# Patient Record
Sex: Male | Born: 1945
Health system: Southern US, Community
[De-identification: ages and names within clinical notes are randomized; demographics above are authoritative.]

## PROBLEM LIST (undated history)

## (undated) DIAGNOSIS — I499 Cardiac arrhythmia, unspecified: Secondary | ICD-10-CM

## (undated) DIAGNOSIS — Z87442 Personal history of urinary calculi: Secondary | ICD-10-CM

## (undated) DIAGNOSIS — I739 Peripheral vascular disease, unspecified: Secondary | ICD-10-CM

## (undated) DIAGNOSIS — E785 Hyperlipidemia, unspecified: Secondary | ICD-10-CM

## (undated) DIAGNOSIS — N400 Enlarged prostate without lower urinary tract symptoms: Secondary | ICD-10-CM

## (undated) DIAGNOSIS — R55 Syncope and collapse: Secondary | ICD-10-CM

## (undated) DIAGNOSIS — I10 Essential (primary) hypertension: Secondary | ICD-10-CM

## (undated) DIAGNOSIS — M1990 Unspecified osteoarthritis, unspecified site: Secondary | ICD-10-CM

## (undated) DIAGNOSIS — I779 Disorder of arteries and arterioles, unspecified: Secondary | ICD-10-CM

## (undated) DIAGNOSIS — M545 Low back pain: Secondary | ICD-10-CM

## (undated) DIAGNOSIS — R319 Hematuria, unspecified: Secondary | ICD-10-CM

## (undated) DIAGNOSIS — I639 Cerebral infarction, unspecified: Secondary | ICD-10-CM

## (undated) DIAGNOSIS — I4819 Other persistent atrial fibrillation: Secondary | ICD-10-CM

## (undated) DIAGNOSIS — G8929 Other chronic pain: Secondary | ICD-10-CM

## (undated) DIAGNOSIS — I340 Nonrheumatic mitral (valve) insufficiency: Secondary | ICD-10-CM

## (undated) HISTORY — DX: Essential (primary) hypertension: I10

## (undated) HISTORY — DX: Other chronic pain: G89.29

## (undated) HISTORY — DX: Hematuria, unspecified: R31.9

## (undated) HISTORY — PX: INGUINAL HERNIA REPAIR: SHX194

## (undated) HISTORY — DX: Low back pain: M54.5

## (undated) HISTORY — PX: WRIST SURGERY: SHX841

## (undated) HISTORY — DX: Hyperlipidemia, unspecified: E78.5

## (undated) HISTORY — DX: Benign prostatic hyperplasia without lower urinary tract symptoms: N40.0

## (undated) HISTORY — DX: Peripheral vascular disease, unspecified: I73.9

## (undated) HISTORY — DX: Cerebral infarction, unspecified: I63.9

## (undated) HISTORY — PX: BLADDER STONE REMOVAL: SHX568

## (undated) HISTORY — DX: Disorder of arteries and arterioles, unspecified: I77.9

## (undated) HISTORY — DX: Nonrheumatic mitral (valve) insufficiency: I34.0

## (undated) HISTORY — DX: Syncope and collapse: R55

---

## 1999-12-16 ENCOUNTER — Inpatient Hospital Stay (HOSPITAL_COMMUNITY): Admission: EM | Admit: 1999-12-16 | Discharge: 1999-12-25 | Payer: Self-pay | Admitting: Emergency Medicine

## 1999-12-16 ENCOUNTER — Encounter: Payer: Self-pay | Admitting: Pediatrics

## 1999-12-16 ENCOUNTER — Encounter: Payer: Self-pay | Admitting: Emergency Medicine

## 1999-12-17 ENCOUNTER — Encounter: Payer: Self-pay | Admitting: Pediatrics

## 1999-12-19 ENCOUNTER — Encounter: Payer: Self-pay | Admitting: Pediatrics

## 2000-03-27 ENCOUNTER — Ambulatory Visit (HOSPITAL_COMMUNITY): Admission: RE | Admit: 2000-03-27 | Discharge: 2000-03-27 | Payer: Self-pay | Admitting: Gastroenterology

## 2000-03-27 ENCOUNTER — Encounter (INDEPENDENT_AMBULATORY_CARE_PROVIDER_SITE_OTHER): Payer: Self-pay

## 2000-10-18 ENCOUNTER — Encounter: Payer: Self-pay | Admitting: Neurology

## 2000-10-18 ENCOUNTER — Ambulatory Visit (HOSPITAL_COMMUNITY): Admission: AD | Admit: 2000-10-18 | Discharge: 2000-10-18 | Payer: Self-pay | Admitting: Neurology

## 2000-11-14 ENCOUNTER — Ambulatory Visit (HOSPITAL_COMMUNITY): Admission: RE | Admit: 2000-11-14 | Discharge: 2000-11-14 | Payer: Self-pay | Admitting: Neurology

## 2001-05-02 ENCOUNTER — Ambulatory Visit (HOSPITAL_COMMUNITY): Admission: RE | Admit: 2001-05-02 | Discharge: 2001-05-02 | Payer: Self-pay | Admitting: Gastroenterology

## 2003-06-05 ENCOUNTER — Ambulatory Visit (HOSPITAL_COMMUNITY): Admission: RE | Admit: 2003-06-05 | Discharge: 2003-06-05 | Payer: Self-pay | Admitting: Gastroenterology

## 2003-06-05 ENCOUNTER — Encounter (INDEPENDENT_AMBULATORY_CARE_PROVIDER_SITE_OTHER): Payer: Self-pay | Admitting: Specialist

## 2007-05-30 HISTORY — PX: PROSTATE SURGERY: SHX751

## 2008-01-21 ENCOUNTER — Encounter (INDEPENDENT_AMBULATORY_CARE_PROVIDER_SITE_OTHER): Payer: Self-pay | Admitting: Surgery

## 2008-01-21 ENCOUNTER — Ambulatory Visit (HOSPITAL_BASED_OUTPATIENT_CLINIC_OR_DEPARTMENT_OTHER): Admission: RE | Admit: 2008-01-21 | Discharge: 2008-01-21 | Payer: Self-pay | Admitting: Surgery

## 2008-05-29 HISTORY — PX: BLADDER STONE REMOVAL: SHX568

## 2008-05-29 HISTORY — PX: PROSTATE SURGERY: SHX751

## 2008-07-24 ENCOUNTER — Ambulatory Visit: Payer: Self-pay | Admitting: Cardiology

## 2008-07-30 ENCOUNTER — Ambulatory Visit: Payer: Self-pay | Admitting: Cardiology

## 2008-07-30 ENCOUNTER — Ambulatory Visit: Payer: Self-pay

## 2008-08-20 ENCOUNTER — Encounter: Payer: Self-pay | Admitting: Cardiology

## 2008-08-20 ENCOUNTER — Ambulatory Visit: Payer: Self-pay | Admitting: Cardiology

## 2008-08-25 ENCOUNTER — Encounter: Payer: Self-pay | Admitting: Cardiology

## 2008-08-25 ENCOUNTER — Ambulatory Visit: Payer: Self-pay

## 2008-09-02 ENCOUNTER — Encounter: Payer: Self-pay | Admitting: Cardiology

## 2008-09-02 ENCOUNTER — Ambulatory Visit: Payer: Self-pay | Admitting: Cardiology

## 2008-09-09 ENCOUNTER — Encounter (INDEPENDENT_AMBULATORY_CARE_PROVIDER_SITE_OTHER): Payer: Self-pay | Admitting: Urology

## 2008-09-09 ENCOUNTER — Inpatient Hospital Stay (HOSPITAL_COMMUNITY): Admission: RE | Admit: 2008-09-09 | Discharge: 2008-09-11 | Payer: Self-pay | Admitting: Urology

## 2008-09-17 ENCOUNTER — Telehealth: Payer: Self-pay | Admitting: Cardiology

## 2008-09-23 ENCOUNTER — Telehealth (INDEPENDENT_AMBULATORY_CARE_PROVIDER_SITE_OTHER): Payer: Self-pay | Admitting: *Deleted

## 2008-10-09 ENCOUNTER — Ambulatory Visit: Payer: Self-pay | Admitting: Cardiology

## 2008-12-04 ENCOUNTER — Encounter: Payer: Self-pay | Admitting: Cardiology

## 2008-12-07 ENCOUNTER — Ambulatory Visit: Payer: Self-pay | Admitting: Cardiology

## 2009-04-14 ENCOUNTER — Encounter (INDEPENDENT_AMBULATORY_CARE_PROVIDER_SITE_OTHER): Payer: Self-pay | Admitting: *Deleted

## 2009-06-04 ENCOUNTER — Ambulatory Visit: Payer: Self-pay | Admitting: Cardiology

## 2010-06-28 NOTE — Assessment & Plan Note (Signed)
Summary: f17m   Visit Type:  Follow-up Primary Provider:  Lupe Carney, MD  CC:  supraventricular tachycardia.  History of Present Illness: Patient is seen for followup of supraventricular tachycardia. He actually is doing well.  He ran a half marathon in the past few months.  He does have some dizziness.  I believe is a component of some vertigo and some mild orthostatic symptoms.  He's not had any syncope or presyncope.  There's been no chest pain.  There've been no significant palpitations.  Current Medications (verified): 1)  Simvastatin 20 Mg Tabs (Simvastatin) .Marland Kitchen.. 1 Once Daily 2)  Aspirin 325 Mg  Tabs (Aspirin) .Marland Kitchen.. 1 Once Daily 3)  Meclizine Hcl 25 Mg Tabs (Meclizine Hcl) .... Take 1 Tablet By Mouth As Directed 4)  Metoprolol Succinate 25 Mg Xr24h-Tab (Metoprolol Succinate) .... Take 1 Tablet By Mouth Every Morning  Allergies (verified): No Known Drug Allergies  Past History:  Past Medical History: supraventricular tachycardia hypercholesterolemia Hypertension Fracture of right hand in the past. CVA.Marland KitchenMarland KitchenLeft insular cortex stroke with left internal carotid artery dissection and    near occlusion. BPH Hematuria Neck discomfort Dizziness....some vertigo and some positional EF 55%  ... echo... August 25, 2008 Mitral regurgitation.... mild... echo... March, 2010 Nuclear stress... July 30, 2008.... no ischemia..... question of ejection fraction 41%, but followup 2-D echo reveals ejection fraction 55%  Review of Systems       Patient denies fever, chills, headache, rash, sweats, change in vision, change in hearing, chest pain, shortness of breath, cough, nausea vomiting, urinary symptoms, musculoskeletal problems.  All other systems are reviewed and are negative other than the history of present illness.  Vital Signs:  Patient profile:   65 year old male Height:      68 inches Weight:      163 pounds BMI:     24.87 Pulse rate:   70 / minute BP sitting:   122 / 86  (left  arm) Cuff size:   regular  Vitals Entered By: Hardin Negus, RMA (June 04, 2009 8:54 AM)  Physical Exam  General:  patient looks quite stable in general today. Head:  head is atraumatic. Eyes:  no xanthelasma. Neck:  no jugular venous distention. Chest Wall:  no chest wall tenderness. Lungs:  lungs are clear.  Respiratory effort is nonlabored. Heart:  cardiac exam is S1 and S2.  No clicks or significant murmurs. Abdomen:  abdomen soft. Msk:  no musculoskeletal deformities. Extremities:  no peripheral edema. Skin:  no skin rashes. Psych:  patient is oriented to person time and place.  Affect is normal.   Impression & Recommendations:  Problem # 1:  HYPERTENSION (ICD-401.9) Blood pressure is under good control.  No change in his meds today.  Problem # 2:  DIZZINESS (ICD-780.4) I carefully reviewed the history the patient's dizziness.  I believe there is a component of vertigo and possibly a mild orthostatic component at times.  He knows to stand up slowly in the morning.  I have encouraged him to use meclizine only if he has true vertigo with room spinning.  He understands this.  No further workup is needed.  Problem # 3:  PAROXYSMAL SUPRAVENTRICULAR TACHYCARDIA (ICD-427.0)  His updated medication list for this problem includes:    Aspirin 325 Mg Tabs (Aspirin) .Marland Kitchen... 1 once daily    Metoprolol Succinate 25 Mg Xr24h-tab (Metoprolol succinate) .Marland Kitchen... Take 1 tablet by mouth every morning  Orders: EKG w/ Interpretation (93000)  The patient has not had  any recurrent palpitations with supraventricular tachycardia.  No change in his meds.  EKG is done today and reviewed by me.  There is normal sinus rhythm.  Problem # 4:  * NECK DISCOMFORT He has not been having any neck discomfort.  No further workup.  Problem # 5:  HYPERLIPIDEMIA (ICD-272.4)  His updated medication list for this problem includes:    Simvastatin 20 Mg Tabs (Simvastatin) .Marland Kitchen... 1 once daily  His lipids are  being treated.  Also the patient back for cardiology followup in one year.  At some point we may consider followup 2-D echo to reassess his LV function.  Patient Instructions: 1)  Follow up in 1 year

## 2010-07-19 ENCOUNTER — Encounter: Payer: Self-pay | Admitting: Cardiology

## 2010-07-19 ENCOUNTER — Ambulatory Visit (INDEPENDENT_AMBULATORY_CARE_PROVIDER_SITE_OTHER): Payer: Medicare Other | Admitting: Cardiology

## 2010-07-19 DIAGNOSIS — I471 Supraventricular tachycardia: Secondary | ICD-10-CM

## 2010-07-26 NOTE — Assessment & Plan Note (Signed)
Summary: Z6X. per pt call.gd   Visit Type:  Follow-up Primary Provider:  Lupe Carney, MD  CC:  supraventricular tachycardia.  History of Present Illness: The patient is seen for followup of supraventricular tachycardia.  I saw him last January, 2011.  His SVT occurred in the past.  He's done well.  He continues to run.  He had an echo and nuclear study in 2010.  There was no ischemia and his ejection fraction was normal.  He mentions to me that he has returned to drinking coffee .  We discussed this at length.  I have encouraged him to cut his total caffeine down somewhat.  Current Medications (verified): 1)  Simvastatin 20 Mg Tabs (Simvastatin) .Marland Kitchen.. 1 Once Daily 2)  Aspirin 325 Mg  Tabs (Aspirin) .Marland Kitchen.. 1 Once Daily 3)  Meclizine Hcl 25 Mg Tabs (Meclizine Hcl) .... Take 1 Tablet By Mouth As Directed 4)  Metoprolol Succinate 25 Mg Xr24h-Tab (Metoprolol Succinate) .... Take 1 Tablet By Mouth Every Morning  Allergies (verified): No Known Drug Allergies  Past History:  Past Medical History: supraventricular tachycardia.. hypercholesterolemia Hypertension Fracture of right hand in the past. CVA.Marland KitchenMarland KitchenLeft insular cortex stroke with left internal carotid artery dissection and    near occlusion. BPH Hematuria Neck discomfort Dizziness....some vertigo and some positional EF 55%  ... echo... August 25, 2008 Mitral regurgitation.... mild... echo... March, 2010 Nuclear stress... July 30, 2008.... no ischemia..... question of ejection fraction 41%, but followup 2-D echo reveals ejection fraction 55%  Review of Systems       Patient denies fever, chills, headache, sweats, rash, change in vision, change in hearing, chest pain, cough, nausea vomiting, urinary symptoms.  All other systems are reviewed and are negative.  Vital Signs:  Patient profile:   65 year old male Height:      68 inches Weight:      162 pounds BMI:     24.72 Pulse rate:   58 / minute BP sitting:   118 / 70  (left  arm) Cuff size:   regular  Vitals Entered By: Hardin Negus, RMA (July 19, 2010 9:03 AM)  Physical Exam  General:  patient looked quite good. Head:  head is atraumatic. Eyes:  no xanthelasma. Neck:  no jugular venous distention.  No carotid bruits. Chest Wall:  no chest wall tenderness. Lungs:  lungs are clear.  Respiratory effort is not labored. Heart:  cardiac exam reveals S1-S2 present no clicks or significant murmurs. Abdomen:  abdomen is soft. Msk:  no musculoskeletal deformities. Extremities:  no peripheral edema. Skin:  no skin rashes. Psych:  patient is oriented to person time place affect is normal.   Impression & Recommendations:  Problem # 1:  HYPERTENSION (ICD-401.9) Blood pressure is under good control.  No change in therapy.  Problem # 2:  DIZZINESS (ICD-780.4) The patient has had mild dizziness in the past.  Is not having any significant problems with this now.  Problem # 3:  PAROXYSMAL SUPRAVENTRICULAR TACHYCARDIA (ICD-427.0) The patient had SVT in the past.  He is not having any recurrent symptoms.  His overall caffeine intake has increased.  We discussed this at length.  I've encouraged him to cut back the total caffeine.  I suggested he might want to try half and half coffee mixture.  EKG done today and reviewed by me.  He has normal sinus rhythm.  There is sinus bradycardia.  No significant abnormalities.  No further workup.  Problem # 4:  * HISTORY OF SPONTANEOUS  CAROTID DISSECTION The patient has history of a spontaneous carotid dissection in the remote past.  Fortunately he done extremely well and there is no further workup needed.  Problem # 5:  HYPERLIPIDEMIA (ICD-272.4) the patient's lipids are treated with simvastatin.  He is on 20 mg dose and there is no reason for any changes.  His labs are followed by his primary physician.  All planes seen for cardiology followup in one year.  Other Orders: EKG w/ Interpretation (93000)  Patient  Instructions: 1)  Your physician wants you to follow-up in:  1 year.  You will receive a reminder letter in the mail two months in advance. If you don't receive a letter, please call our office to schedule the follow-up appointment.

## 2010-08-26 ENCOUNTER — Other Ambulatory Visit: Payer: Self-pay | Admitting: Dermatology

## 2010-08-30 ENCOUNTER — Other Ambulatory Visit: Payer: Self-pay | Admitting: Cardiology

## 2010-09-07 LAB — CBC
HCT: 41.8 % (ref 39.0–52.0)
Hemoglobin: 14.4 g/dL (ref 13.0–17.0)
MCHC: 34.1 g/dL (ref 30.0–36.0)
MCV: 97.2 fL (ref 78.0–100.0)
Platelets: 140 10*3/uL — ABNORMAL LOW (ref 150–400)
Platelets: 180 10*3/uL (ref 150–400)
RBC: 4.72 MIL/uL (ref 4.22–5.81)
RDW: 13.5 % (ref 11.5–15.5)
WBC: 8.1 10*3/uL (ref 4.0–10.5)

## 2010-09-07 LAB — URINALYSIS, ROUTINE W REFLEX MICROSCOPIC
Ketones, ur: NEGATIVE mg/dL
Nitrite: POSITIVE — AB
Specific Gravity, Urine: 1.022 (ref 1.005–1.030)
Urobilinogen, UA: 0.2 mg/dL (ref 0.0–1.0)
pH: 6.5 (ref 5.0–8.0)

## 2010-09-07 LAB — BASIC METABOLIC PANEL
BUN: 14 mg/dL (ref 6–23)
CO2: 27 mEq/L (ref 19–32)
Calcium: 8.5 mg/dL (ref 8.4–10.5)
Calcium: 9.2 mg/dL (ref 8.4–10.5)
Creatinine, Ser: 1 mg/dL (ref 0.4–1.5)
Creatinine, Ser: 1.12 mg/dL (ref 0.4–1.5)
GFR calc Af Amer: >60 mL/min (ref >60)
GFR calc Af Amer: >60 mL/min (ref >60)
GFR calc non Af Amer: >60 mL/min (ref >60)
Sodium: 137 mEq/L (ref 135–145)

## 2010-10-11 NOTE — Op Note (Signed)
Billy Carpenter, Billy Carpenter                ACCOUNT NO.:  [id number]   MEDICAL RECORD NO.:  [id number]          PATIENT TYPE:  AMB   LOCATION:  [facility]                         FACILITY:  [facility]   PHYSICIAN:  Currie Paris, M.D.DATE OF BIRTH:  [birth year]   DATE OF PROCEDURE:  01/21/2008  DATE OF DISCHARGE:                               OPERATIVE REPORT   Office medical [facility] [phone].   PREOPERATIVE DIAGNOSIS:  Left inguinal hernia.   POSTOPERATIVE DIAGNOSIS:  Left inguinal hernia, indirect.   OPERATION:  Repair of left inguinal hernia with mesh.   SURGEON:  Currie Paris, M.D.   ANESTHESIA:  General (LMA).   CLINICAL HISTORY:  Mr. Homewood is a 65 year old gentleman who has a fairly  large left inguinal hernia that he desired to have repaired.   DESCRIPTION OF PROCEDURE:  The patient was seen in the holding area and  we went over the plans for the procedure and answered questions.  I  identified and marked the left inguinal area as the operative site.   The patient was taken to the operating room.  After satisfactory general  (LMA) anesthesia had been obtained, the left groin area was clipped,  prepped and draped.  The time-out was done.   To help with postop pain relief, I injected the area with 0.25% plain  Marcaine using incision line, right angles to the incision line, and  near the anterior superior iliac spine subfascially.   I made an inguinal incision and bleeders were either tied with 4-0  Vicryl or electrocoagulated.  The external oblique was identified and  cleaned off so that I could clearly identify that and the deep and the  superficial ring.  Self-retaining retractor was placed.   The external oblique was opened in the line of its fibers.  The cord  structures were densely adherent to the undersurface of the external  oblique near the superficial ring and had to be sharply and bluntly  dissected off.  This was clearly a chronic hernia with a fair amount of  chronic inflammatory changes.   Once I had that cleared off I was able to free up the cord structures  and surrounded them with a Penrose drain and cleared this up all the way  back to the deep ring.  The floor appeared intact although somewhat  attenuated.   The cord structures were dissected into and I identified a large thick  hernia sac consistent as an indirect hernia.  This was opened so I could  put my finger in this and strip off the surrounding densely adherent  cord structures.  There was also a fair amount of dense scar tissue  involving the cremaster muscle that I was able to divide with the  cautery to clear off the cord a little bit better.   When I was getting the indirect sac down towards the space, I looked in  it and I could see a piece of colon coming out into the sac.  I sharply  freed that up from some adhesions to the sac so that it reduced itself  nicely  back into the abdomen.  I do not believe this was a sliding  hernia but just a little piece of colon that was stuck into the sac.   With that done, I carefully placed a pursestring of silk at the base of  the sac, keeping visualization of the sac to make sure no further bowel  nestled up into the hernia sac and the sac was then amputated.   I cleaned off the floor to make sure everything looked good and then put  a piece of Bard mesh in using 2-0 Prolene.  It was cut to shape and  split to go around the cord and sutured with a running suture starting  at the pubic tubercle and running down along the shelving edge until I  got to the deep ring.  It then lay nicely medially over the internal  oblique and was tacked down.  The tails were crossed to go lateral to  the cord structures and tacked together.   I made sure everything was dry and put Marcaine in as I worked to be  sure we had as best we could good pain relief postoperatively.   The incision was then closed with 3-0 Vicryl to close the external   oblique over the repair, 3-0 Vicryl on Scarpa's and 4-0 Monocryl  subcuticular plus Dermabond on the skin.   The patient tolerated the procedure well and there were no  complications.  All counts were correct.      Currie Paris, M.D.  Electronically Signed     CJS/MEDQ  D:  01/21/2008  T:  01/21/2008  Job:  [id number]   cc:   L. Lupe Carney, M.D.  Fax: [phone]

## 2010-10-11 NOTE — Op Note (Signed)
NAMESHONN, FARRUGGIA                ACCOUNT NO.:  [id number]   MEDICAL RECORD NO.:  [id number]          PATIENT TYPE:  INP   LOCATION:  0005                         FACILITY:  [facility]   PHYSICIAN:  Courtney Paris, M.D.DATE OF BIRTH:  05-24-1946   DATE OF PROCEDURE:  09/09/2008  DATE OF DISCHARGE:                               OPERATIVE REPORT   PREOPERATIVE DIAGNOSIS:  Bladder stone 1.5 cm, obstructive uropathy.   POSTOPERATIVE DIAGNOSIS:  Bladder stone 1.5 cm, obstructive uropathy.   OPERATIONS:  Vesicolitholapaxy with holmium laser (1.5 cm),  transurethral resection of the prostate.   ANESTHESIA:  General.   SURGEON:  Courtney Paris, M.D.   BRIEF HISTORY:  This 66 year old patient has had obstructive uropathy  for quite some time.  He is on finasteride for at least 10 months and  his symptoms have not improved.  He is unable to take alpha blockers due  to drop in blood pressure.  He has a large bladder stone 1.5 cm and when  he runs he bleeds.  He enters to have this corrected surgically at this  time.   DESCRIPTION OF PROCEDURE:  The patient was placed on the table in dorsal  lithotomy position after satisfactory induction of general anesthesia.  He was prepped and draped with Betadine in the usual sterile fashion and  Cipro was started IV.  Time-out was then performed and the patient and  procedure then reconfirmed.  His urethra was dilated with Sissy Hoff  sounds to 28-French and the 28 Olympus continuous flow resectoscope was  inserted.  Under direct vision the stone could be seen, was photographed  and using the 1000 micron fiber with the holmium laser I was able to  under direct vision break the stone into tiny pieces.  The pieces were  then irrigated out through the sheath.  The resectoscope was then  replaced and under direct vision the bladder neck was first incised  between 5 and 7 o'clock position taking care not to injure the orifices.  This was taken  down to circular bladder neck fibers.  Then beginning at  the 12 o'clock position on the left side, I resected from 12 to 5  o'clock resecting the adenoma down to prostatic surgical capsule back to  the verumontanum.  Similarly on the right side I did the same thing,  resecting at the 12 o'clock position moving counterclockwise back to the  7 o'clock position in a similar fashion.  Hemostasis was achieved as I  proceeded.  The chips were then evacuated from bladder.  When viewing  from the veru, the bladder neck and prostatic urethra were wide open.  Pictures were made of this.  The chips were then evacuated from the  bladder.  The orifices looked intact and when viewing from the veru, the  bladder neck was wide open.  Scope was removed and the patient voided  with a good forceful stream.  A 22 three-way catheter was then inserted  without difficulty and the irrigant was then clear.  This was attached  to continuous bladder irrigation.  He was taken  recovery room in good  condition.  He will be admitted for postoperative care.     Courtney Paris, M.D.  Electronically Signed    HMK/MEDQ  D:  09/09/2008  T:  09/09/2008  Job:  [id number]

## 2010-10-11 NOTE — Assessment & Plan Note (Signed)
[facility]                            CARDIOLOGY OFFICE NOTE   Billy Carpenter, Billy Carpenter                       MRN:          [id number]  DATE:08/20/2008                            DOB:          1945-10-21    Billy Carpenter is seen today for cardiology followup.  I saw Billy Carpenter in  consultation on July 24, 2008.  At that time he complained of  palpitations.  He had also had some dizziness when getting up at night.  I recommended meclizine for the dizziness and this appears to help.  For  the palpitations, he has been wearing an event recorder.  We also did a  stress Myoview scan.  The patient runs regularly and I wanted to be sure  that there was no sign of significant ischemia.   On the treadmill he exercised for 10 minutes and 30 seconds.  This was  quite good for a 65 year old gentleman.  There were no diagnostic ST  changes.  There was slight decreased activity in the inferior wall.  This was probably due to inferior thinning.  There was no definite  ischemia.  The ejection fraction calculation by the nuclear scan was  decreased at 41%.   The patient has been wearing an event recorder.  While running on August 08, 2008, he noticed that as he started off he felt fairly short of  breath before picking up his work load.  This is probably when the  rhythm is documented showing supraventricular tachycardia at a rate of  approximately 170.  There was also some wide-complex tachycardia.  The  strips were reviewed by Dr. Graciela Husbands who felt that the wide-complex  tachycardia was heart attack.  He did feel that there was  supraventricular tachycardia with the possibility of atrial flutter or  the possibility of an atrial tachycardia.  The patient had symptoms  again on March 20 and there are more strips available.  Once again, he  had a narrow complex rhythm.  The rate again was in the range of 170.  On page 6 of the recording, the rhythm stops and he returns to  sinus  rhythm.  I do not see obvious flutter waves.   Today he returns and he is stable.  I have completely explained to him  the results of the nuclear scan.  I explained that we needed to assess  his LV function further with an echo to be sure about LV size and wall  motion.  Because of the supraventricular arrhythmia, I put him on a  small dose of beta blocker.   PAST MEDICAL HISTORY:   ALLERGIES:  No known drug allergies.   MEDICATIONS:  1. Aspirin.  2. Finasteride.  3. Simvastatin 20.  4. Metoprolol succinate 25 mg to be started today.   OTHER MEDICAL PROBLEMS:  See the list below.   REVIEW OF SYSTEMS:  He has no fevers or chills.  He is not having  headaches.  There are no eye problems.  His slight dizziness at night  time is improving.  He has no GI or  GU symptoms.  Otherwise his review  of systems is negative.   PHYSICAL EXAMINATION:  VITAL SIGNS:  Blood pressure is 140/90 with a  pulse of 62.  GENERAL:  The patient is oriented to person, time and place.  Affect is  normal.  HEENT:  Reveals no xanthelasma.  He has normal extraocular motion.  There are no carotid bruits.  There is no jugular venous distention.  LUNGS:  Clear.  Respiratory effort is not labored.  CARDIAC:  Exam reveals an S1 with an S2.  There are no clicks or  significant murmurs.  ABDOMEN:  The abdomen is soft.  There is no peripheral edema.   See the discussion above about the result of the nuclear scan and the  event recorder.   Problems include:  1. Hypercholesterolemia, treated.  2. Benign prostatic hypertrophy, treated.  3. History of a left carotid dissection in the past that was      spontaneous.  He was followed by neurology over time and he      stabilized.  4. History of tonsillectomy and left inguinal hernia.  5. Hematuria.  He has a bladder stone.  His cardiac status is stable      and he can undergo a procedure by Dr. Aldean Ast as indicated.  6. History of some neck discomfort.   This does not appear to be      ischemia by Myoview scan.  7. Palpitations with documented supraventricular tachycardia.  As      outlined, I am still not sure what this rhythm is.  Consideration      is given to atrial tachycardia or atrial flutter.  I have started      him on a low-dose beta blocker.  He will continue to wear the event      recorder for several more days.  I will then see him back for      followup.  8. Mild elevation of blood pressure.  This needs continued followup.  9. Sensation of some dizziness at night which probably is vertigo      responding to meclizine.   Low-dose beta blocker is added.  Two-dimensional echo is ordered.  I  will then see the patient for followup to see how to proceed based on  what his echo shows.  I spent an extended amount of time reviewing the  pages of the event recorder.  Also I reviewed all of the data and  discussed with him as outlined above.     Luis Abed, MD, [facility]  Electronically Signed    JDK/MedQ  DD: 08/20/2008  DT: 08/20/2008  Job #: [id number]   cc:   L. Lupe Carney, M.D.  Courtney Paris, M.D.

## 2010-10-11 NOTE — Assessment & Plan Note (Signed)
[facility]                                 ON-CALL NOTE   Billy Carpenter, Billy Carpenter                       MRN:          [id number]  DATE:08/09/2008                            DOB:          15-Nov-1945    CARDIOLOGIST:  Luis Abed, MD, [facility]   HISTORY:  I was contacted by [name] at [place] at [phone]  about Billy Carpenter.  He apparently had wide complex tachycardia today on  his event monitor.  He was recently evaluated by Dr. Myrtis Ser in late  February for dizziness.  Apparently the patient was set up for a stress  Myoview study.  The results of that test are unavailable to me at this  time.  The patient just completed the test last week.  I did speak with  the patient over the telephone today.  He tells me he went out for a run  at the time that the event was noted.  He felt somewhat sluggish at the  beginning of his run but completed of total of 45 minutes and felt well  during the run.  Afterwards he felt a little bit lightheaded but  otherwise denies any syncope, near syncope or chest pain.  I got a copy  of the strips from the [employer].  He has a great deal of  artifact noted at the time that he was out running.  There is one event  around 10:02 a.m. that appears to be a wide complex tachycardia with  rate of about 190.  It is hard to know if this is anything other than  sinus tachycardia with aberrancy or if he may have atrial flutter with  aberrancy or possibly nonsustained V tach.   PLAN:  I have asked the patient not to run any more until he is  contacted for further instructions.  I have left a message at the office  for someone to pull the strips and have them reviewed by Dr. Myrtis Ser.  I  have also asked that someone contact the patient for further  instructions if any.   DISPOSITION:  As outlined above.      Tereso Newcomer, PA-C  Electronically Signed      Luis Abed, MD, [facility]  Electronically Signed   SW/MedQ  DD:  08/09/2008  DT: 08/09/2008  Job #: [id number]

## 2010-10-11 NOTE — Discharge Summary (Signed)
Billy Carpenter, Billy Carpenter                ACCOUNT NO.:  [id number]   MEDICAL RECORD NO.:  [id number]          PATIENT TYPE:  INP   LOCATION:  1434                         FACILITY:  [facility]   PHYSICIAN:  Courtney Paris, M.D.DATE OF BIRTH:  [birth year]   DATE OF ADMISSION:  09/09/2008  DATE OF DISCHARGE:  09/11/2008                               DISCHARGE SUMMARY   DISCHARGE DIAGNOSES:  1. Bladder outlet obstruction.  2. Bladder calculus.  3. Obstructive uropathy.   OPERATIONS AND PROCEDURES:  TUR of the prostate and vesicolithotomy on  September 09, 2008.   BRIEF HISTORY:  This 65 year old patient had a 1 cm bladder stone and  obstructive uropathy.  He enters for TURP and removal of the bladder  stone.  He began to have bleeding with running.  He has had voiding  symptoms that had not improved with finasteride for 10 to 11 months.  He  is unable to take alpha blockers due to hypotension.  He had a left  carotid tear in July 2001 and is on Coumadin for 30 days.  He had a left  inguinal hernia repair in August 2009.  Cardiac monitor was negative.  He does have a family history of prostate cancer.  He enters now for  definitive therapy for his bladder stone and his obstructive uropathy.   The patient was admitted to the hospital.  His laboratory values were  normal.  He was taken to the operating room as an outpatient, underwent  a TUR of the prostate and a holmium laser lithotripsy of the bladder  stone.  Postoperatively, his urine was really clear with continuous  bladder irrigation so much so that I went ahead and removed the catheter  on the first postop day.  He voided for a couple of times but then end  up going into retention and I think this caused some bleeding, which  then had to be irrigated out with numerous hand irrigations.  Finally,  all the clots were gone and with a leg bag with ambulation, his urine  was just light pink and draining well.  He was discharged with the  Foley  catheter to have this removed in 3 days at home.  The path was benign.  He was sent home in improved ambulatory condition on a regular diet.  Continue his finasteride and antibiotics.      Courtney Paris, M.D.  Electronically Signed     HMK/MEDQ  D:  09/11/2008  T:  09/12/2008  Job:  [id number]

## 2010-10-11 NOTE — Assessment & Plan Note (Signed)
[facility]                            CARDIOLOGY OFFICE NOTE   Billy Carpenter, Billy Carpenter                       MRN:          [id number]  DATE:07/24/2008                            DOB:          [birth year]    [name] has been very active over the years.  He runs on a regular  basis.  Recently, he has had some dizziness and lightheadedness after  exertion.  He is here for further cardiac evaluation to see if there are  any significant cardiac abnormalities.  He has been quite healthy over  the years.  He does have BPH and he is on finasteride, and he is  followed by Dr. Aldean Carpenter.  He has also had a bladder stone that has  caused some blood in his urine primarily when he runs.  This may be  treated with laser in the near future.  In 2001, he had a spontaneous  left carotid dissection.  This presented with a headache.  He  stabilized.  He was followed by Dr. Anne Carpenter and Dr. Sharene Carpenter at Neurology  and by history from the patient, this improved spontaneously.  He has  been quite active.  He mentions that sometimes after running, he has a  sensation of lightheadedness and dizziness.  He also mentions that  sometimes in the evening when lying down or when getting up, he feels  the sensation of dizziness.  He has not had syncope or presyncope.   ALLERGIES:  No known drug allergies.   MEDICATIONS:  Aspirin, finasteride, sulfamethoxazole for his prostate,  and simvastatin.   OTHER MEDICAL PROBLEMS:  See the complete list below.   SOCIAL HISTORY:  The patient does not smoke.  He has mild alcohol  intake.  He exercises regularly.  He is married.   FAMILY HISTORY:  Noncontributory.   REVIEW OF SYSTEMS:  At the current time, he is not having any fevers or  chills.  He is not having any headaches.  There are no skin rashes.  He  has no GI or GU symptoms.  He has no major musculoskeletal complaints.  His review of systems is negative.   PHYSICAL EXAMINATION:   VITAL SIGNS:  Blood pressure 128/80 with a pulse  of 63.  Weight is 165 pounds.  The patient also had orthostatic blood  pressure check done today.  Lying 123/83 with a pulse of 62, sitting  131/90 with a pulse of 71 and some mild dizziness, standing 146/93 with  a rate of 69 and mild dizziness.  At 2 minutes standing 150/97 with a  rate of 72 and mild dizziness.  At 5 minutes standing 152/96 with a rate  of 82 and mild dizziness.  GENERAL:  The patient is oriented to person, time, and place.  Affect is  normal.  HEENT:  No xanthelasma.  He has normal extraocular motion.  NECK:  There are no carotid bruits.  There is no jugular venous  distention.  LUNGS:  Clear.  Respiratory effort is not labored.  CARDIAC:  S1 and S2.  There are no clicks or significant  murmurs.  ABDOMEN:  Soft.  EXTREMITIES:  He has no peripheral edema.   LABORATORY DATA:  EKG is done today.  It is normal.   PROBLEMS:  1. Hypercholesterolemia, treated with simvastatin.  2. Benign prostatic hyperplasia, treated with finasteride and      currently on sulfa.  3. History of a left carotid dissection in the past.  We need more      information from the Neurology office.  I will also go into E-chart      to see if any old data is present.  4. History of status post tonsillectomy and left inguinal hernia      repair.  5. Hematuria.  This is followed by Dr. Aldean Carpenter and it is related to      his bladder stone, which may be treated with laser in the future.  6. Some mild neck discomfort over time.  The patient also has had some      palpitations over time.  Historically, he had this in the past with      some increased caffeine.  He had stopped caffeine completely.      Recently, he had modest intake.  We will obtain 21-day monitor to      see if we can document the type of rhythm he is having at the time      of palpitations.  7. Question of mild increase in blood pressure recently.  8. Sensation of dizziness  sometimes when getting up at night,      sometimes after exercise.  At this time, I am not convinced that      this is related primarily to a cardiac abnormality.  Some of it      maybe vertigo.  He did have an episode of major vertigo in the      past.  I will give him a small trial of meclizine to try at      nighttime to see if this helps with some of the nighttime symptoms      including when he gets up at night to use the bathroom.  The      patient is active and does have risk factors for coronary artery      disease.  It is therefore, we will proceed with a stress Myoview      scan.  I will be able to see if he developed any stress-related      arrhythmias, whether he develop any stress-related hypotension and      to be sure that he has no marked ischemia.  These studies will be      arranged and I will see him back for followup.     Billy Abed, MD, [facility]  Electronically Signed    JDK/MedQ  DD: 07/24/2008  DT: 07/25/2008  Job #: [id number]   cc:   Billy Carpenter, M.D.  Billy Carpenter, M.D.

## 2010-10-14 NOTE — Discharge Summary (Signed)
[place]  Patient:    Billy Carpenter, Billy Carpenter                       MRN: [id number] Adm. Date:  [id number] Disc. Date: [id number] Attending:  Mick Sell CC:         [facility], [address]                           Discharge Summary  ADMISSION DIAGNOSIS:  Onset of left brain stroke, rule out carotid occlusion.  DISCHARGE DIAGNOSES: 1. 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 2. History of borderline increased cholesterol. 3. History of guaiac-positive stools.  PROCEDURES THIS ADMISSION: 1. CT of the brain. 2. MRI of the brain. 3. MRI angiogram. 4. Cerebral angiogram. 5. Carotid Doppler study.  COMPLICATIONS:  None.  HISTORY OF PRESENT ILLNESS:  Royer Carpenter is a 65 year old right-handed, white male born 1945-06-29 with a history of cardiac arrhythmias in the past, hypercholesterolemia.  The patient has been admitted to [facility] on the 16 December 1999, with onset of left eye pain, visual loss in the left eye with a superior ______ defect, pain in the neck, both sides of head.  This began to some degree off and on almost a week prior to admission but was more constant, severe on the day of admission.  The patient was brought to the hospital for an evaluation.  Code stroke was activated.  The patient was found to have findings consistent with a left insular cortex CVA and the patient was admitted for evaluation.  The patient was placed on heparin.  PAST MEDICAL HISTORY: 1. History of cardiac arrhythmia.  No MI in the past. 2. History of borderline hypercholesterolemia. 3. History of hypertension on the day of admission. 4. History of guaiac-positive stool without GI evaluation previously. 5. History of right hand fracture in the past.  MEDICATIONS PRIOR TO ADMISSION:  None.  SOCIAL HISTORY:  The patient does not smoke, uses alcohol on  occasion. Please refer to history and physical dictation summary for social history, family history, review of systems, and physical examination.  LABORATORY VALUES:  Notable for an INR of 2.0 at the time of discharge.  White count 6.0, hemoglobin 15.1, hematocrit 41.6, MCV of 90.5, platelets of 209. Sodium 141, potassium 3.8, chloride of 105, CO2 of 29.  Glucose of 113, BUN of 14, creatinine 1.0, calcium 8.8, total protein 6.7, albumin 3.9.  AST of 22, ALT of 27, ______ 73, total bilirubin of 0.9.  Cholesterol level 195 with HDL level of 40, LDL of 133.  Ris ratio of 4.9 which is average.  Chest x-ray shows no acute abnormalities.  CT scan of the brain shows questionable sulcal effacement, left parietal lobe area.  ECG reveals normal sinus rhythm, normal ECG, heart rate 66.  HOSPITAL COURSE:  The patient has done very well during the course of hospitalization.  The patient was admitted to [facility] and underwent a CT of the head that was reviewed previously on the day of admission.  The patient was setup for an MRI scan of the brain which showed acute infarct in the ______ of the left insular cortex and MRI angiogram shows probable occlusion of left internal carotid artery versus high-grade proximal stenosis.  Carotid Doppler study was also performed showing evidence of a  right internal carotid artery stenosis, evidence of left internal carotid artery occlusion, vertebral artery flow.  The patient was setup for a cerebral angiogram that was done showing evidence of 95-98% narrowing of the left internal carotid artery but the vessel was open.  CVTS consultation was obtained and it was felt that the patient had a carotid dissected that had proceeded distally to consider surgery.  This patient was converted to Coumadin which took several days and the patient is being discharged with an INR of 2.0 on the day of this dictation.  The patient has complained with some double vision  with a left abducens palsy but this is resolving and the patient has no neurologic deficits otherwise with motor or sensory complaints.  Headache has resolved.  The patient has normal speech pattern.  The patient is fully ambulatory.  At the time of discharge, the patient will be on Coumadin 5 mg one a day.  He is to have no strenuous activity for six weeks.  He is to refrain from traveling from with work for about three weeks and is to eat no leafy green vegetables.  The patient will have a pro time checked within two days following discharge.  This will be done at [facility].  The patient is to follow up with the [facility] within two to three weeks for an evaluation.  The patient is to contact our office immediately if any further problems arise.  With a history of heme-positive stools, the patient will need to be watched for signs of melena. DD:  12/25/99 TD:  12/27/99 Job: [id number]

## 2010-10-14 NOTE — Op Note (Signed)
NAME:  Billy Carpenter, Billy Carpenter                          ACCOUNT NO.:  [id number]   MEDICAL RECORD NO.:  [id number]                   PATIENT TYPE:  AMB   LOCATION:  ENDO                                 FACILITY:  [facility]   PHYSICIAN:  James L. Malon Kindle., M.D.          DATE OF BIRTH:  [birth year]   DATE OF PROCEDURE:  06/05/2003  DATE OF DISCHARGE:                                 OPERATIVE REPORT   PROCEDURE:  Colonoscopy and coagulation of polyp.   MEDICATIONS:  Fentanyl 70 mcg, Versed 7 mg IV.   ENDOSCOPE:  Adult Olympus colonoscope.   INDICATIONS:  Previous history of adenomatous colon polyps.  He had one  massive adenomatous polyp removed.   DESCRIPTION OF PROCEDURE:  The procedure had been explained to the patient  and consent obtained.  With the patient in the left lateral decubitus  position, the adult Olympus colonoscope was inserted and advanced.  The  patient had a long, tortuous colon.  Using abdominal pressure and position  changes, we were able to advance to the cecum.  The ileocecal valve and  appendiceal orifice were seen.  Across from the ileocecal valve was a 3-4 mm  sessile polyp, which was cauterized with hot biopsy forceps.  There was no  bleeding.  The scope was withdrawn and the cecum, ascending colon,  transverse, descending and sigmoid colon were seen well.  Upon removal, no  polyps seen otherwise.  No diverticular disease.  The rectum was free of  polyps.  The scope was withdrawn and the patient tolerated the procedure  well.   ASSESSMENT:  Cecal polyp, 221.3.   PLAN:  Routine postpolypectomy instructions.  Will recommend repeating  procedure in three years.                                               James L. Malon Kindle., M.D.    Waldron Session  D:  06/05/2003  T:  06/06/2003  Job:  [id number]   cc:   L. Lupe Carney, M.D.  [address] [place]  [place] [address]  Fax: [phone]

## 2010-10-14 NOTE — Consult Note (Signed)
[place]  Patient:    Billy Carpenter, Billy Carpenter                       MRN: [id number] Proc. Date: 12/20/99 Adm. Date:  [id number] Attending:  Mick Sell CC:         Deanna Artis. Sharene Skeans, M.D.                          Consultation Report  CHIEF COMPLAINT:  Recent left brain CVA secondary to severe left carotid occlusive disease.  HISTORY OF PRESENT ILLNESS:  I appreciate the opportunity of seeing this 65 year old gentleman in consultation regarding his recent left brain CVA. This healthy 65 year old gentleman had the sudden onset of left eye pain and a headache on December 16, 1999 and developed some loss of his visual field in the left eye and some speech disturbance.  He was seen in the [facility] Emergency Room and admitted for possible left brain CVA.  MRI scan subsequently revealed acute infarct in the left frontoparietal lobe and possible occlusion of left internal carotid artery.  The patient had no preexisting symptoms of cerebral ischemia, although he did notice that recently he has been having increasing headaches in the post occipital region and occasional "woozy" feeling while running which he has not experienced in the past.  He denies any previous history of amaurosis fugax, hemiparesis, aphagia, posterior circulation, or TIAs.  He was not taking any medications at the time of his admission to the hospital.  PAST MEDICAL HISTORY:  Positive for cardiac arrhythmias.  Coronary artery disease is negative. No history of myocardial infarction, angina pectoris, CVA, TIAs, diabetes mellitus, deep vein thrombosis, thrombophlebitis or hypertension.  SOCIAL HISTORY: Negative for tobacco.  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  PAST SURGICAL HISTORY:  None.  REVIEW OF SYSTEMS:  Negative with no previous vascular problems or GI problems.  PHYSICAL EXAMINATION:  The patient was alert and oriented, well-developed and well-nourished  white male in no apparent distress.  Neck is supple with 3+ carotid pulses.  No bruits were audible.  There was no palpable adenopathy in the neck.   Upper extremity pulses are intact with good strength. His chest is clear.  His abdomen is soft, nontender with no palpable masses.  His lower extremity pulses are symmetrical bilaterally with good strength, 3+ femoral, popliteal and posterior tibial pulses.  There is no distal edema. Neurologic exam today reveals fluent speech with no difficulty or slurred speech.  He has good motor and sensory function bilaterally.  He states that he still has a minor visual field loss in the left eye but grossly is intact.  HOSPITAL STUDIES:  A Duplex carotid scan done on December 19, 1999 which revealed possible occlusion of the left internal carotid artery with no significant right internal carotid stenosis.  Cerebral angiography was performed on December 19, 1999 and has not been reviewed by me yet but did reveal apparently severe focal stenosis in his left internal carotid artery with possible localized dissection but distal flow intact and no significant right internal carotid disease.  IMPRESSION:  Recent left brain cerebrovascular accident with severe left internal carotid occlusive disease.  RECOMMENDATION:  Would continue heparin therapy and I will review the cerebral angiograms.  Would recommend left carotid endarterectomy later this week after review and allowing a few more days for cerebral edema to resolve following acute  infarct.  Doubt that this is a spontaneous dissection of the carotid but more likely due to an atherosclerotic plaque.  DD:  12/20/99 TD:  12/21/99 Job: [id number]

## 2010-10-14 NOTE — H&P (Signed)
[place]  Patient:    Billy Carpenter, Billy Carpenter                       MRN: [id number] Adm. Date:  [id number] Attending:  Mick Sell                         History and Physical  DATE OF BIRTH: 1945/08/17  CHIEF COMPLAINT: [name] is a 65 year old right-handed married Caucasian gentleman who had sudden onset of left eye pain with visual loss in the left eye (superior altitudinal defect), pain in his neck involving both sides of the head and the base of the brain, around 12:30 p.m. this afternoon.  He was very bothered by the headache to the point where he needed to lie down in his office.  He noted onset of speech disturbance beginning somewhere in the afternoon, possibly around 3:30 p.m.  His daughter called around 3:45 p.m. from [place] and noted speech difficulty.  EMS was contacted at 16:15 and arrived at [facility] at 16:53.  ER evaluation at 18:30, call to me at 18:53.  Code stroke was activated at that time.  I was at the bedside at 19:45.  PAST MEDICAL HISTORY:  1. Cardiac arrhythmia (no MI).  2. Borderline hypercholesterolemia (220s).  3. Hypertension was noted only today (see below).  4. Positive for guaiac positive stool within the past year.  This was not     further evaluated.  5. Fracture of right hand in the past.  The patient has not had diabetes mellitus.  PAST SURGICAL HISTORY: None.  SOCIAL HISTORY: He does not use tobacco.  He only uses occasional alcohol.  He is a Technical brewer for [employer].  REVIEW OF SYSTEMS: The patient has not had fever or intercurrent infection of the head, neck, lungs, GI, GU, myalgias, epistaxis, hematemesis, hematochezia, or melena.  There has been a speech disorder as noted above.  The patient has not noted weakness, numbness, diplopia, dysarthria, dysphagia, tinnitus, syncope, vertigo, or loss of bowel or bladder control.  No seizures.  The patient has  not had any palpitations, chest pain, shortness of breath, edema, congestive heart failure.  No underlying renal dysfunction.  Review Of Systems except as noted is otherwise negative.  FAMILY HISTORY: Parents are still alive, father age 7 with prostate cancer and mother age 74 with hypertension and arthritis.  No family history of cerebrovascular accident.  PHYSICAL EXAMINATION:  VITAL SIGNS: Initial blood pressure 206/138 dropped to 157/108, then dropped to 137/85 without specific treatment.  Pulse 65, respirations 20, temperature 98.0 degrees.  Pulse oximetry 98%.  HEENT: No signs of infection.  NECK: Supple, full range of motion.  No cranial or cervical bruits.  No localized tenderness.  No meningismus.  LUNGS: Clear.  HEART: No murmurs.  Pulses normal.  ABDOMEN: Soft, bowel sounds normal.  No hepatosplenomegaly.  EXTREMITIES: Normal.  NEUROLOGIC: The patient has significant fluent dysphagia.  He could name some objects with some difficulty repeating.  Follows commands well.  Cranial nerves show pupils are round and reactive.  Visual fields full.  Fundi were normal.  OKN responses equal.  Symmetric facial strength.  Midline tongue and uvula.  Air conduction greater than bone conduction bilaterally.  Motor examination shows normal strength.  Good fine motor movements.  No pronator drift.  The patient has excellent strength in the arms and legs, both sides.  Sensory examination intact to primary and cortical modalities including stereognosis, cold, and vibration.  Cerebellar examination shows good finger-to-nose and rapid repetitive movements.  Gait was not tested. Deep tendon reflexes are normal.  The patient had bilateral flexor and plantar responses.  IMPRESSION: I suspect a left insular cortex cerebrovascular accident.  I suspect a left carotid occlusion as the etiology of this because of the eye pain and ipsilateral visual loss and slow onset of ipsilateral  cortical dysfunction.  With the history of guaiac positive stool I have given the family informed consent regarding the necessity of heparin.  I suspect that either the patient has bled into a plaque or perhaps had a dissection.  PLAN: Heparin tonight.  MRI with intracranial MRA and extracranial MRA tomorrow.  This will need to be done on an expedited basis and I will speak with the radiologist in the early morning.  The patient will also need to have a 2D echocardiogram and if the MRA is sufficiently of good quality (extracranial), we will likely discontinue the carotid Doppler.  We will watch the patients stools and guaiac them.  If there is significant rectal bleeding we will have to stop heparin.  It is certainly possible that arteriogram could be considered; however, if there is a dissection we will wait for recannulization.  If there is a hemorrhagic plaque, we will consult CV/TS that they may recommend a conservative approach as long as the patient is not deteriorating.  We will need to work on obtaining a primary care physician for secondary stroke prevention upon his discharge. DD:  12/16/99 TD:  12/17/99 Job: [id number]

## 2010-10-14 NOTE — Procedures (Signed)
[facility]  Patient:    Billy Carpenter, Billy Carpenter Visit Number: [id number] MRN: [id number]          Service Type: END Location: ENDO Attending Physician:  Orland Mustard Dictated by:   Llana Aliment. Randa Evens, M.D. Proc. Date: 05/02/01 Admit Date:  05/02/2001   CC:         Wayne C. Dorna Bloom, M.D.   Procedure Report  PROCEDURE PERFORMED:  Colonoscopy.  ENDOSCOPIST:  Llana Aliment. Randa Evens, M.D.  MEDICATIONS USED:  Fentanyl 50 mcg, Versed 5 mg IV.  INSTRUMENT:  INDICATIONS:  Gentleman had heme positive stool one year ago and had multiple polyps removed.  One was a massive 3 cm polyp.  This procedure was done to evaluate for additional polyps.  DESCRIPTION OF PROCEDURE:  The procedure had been explained to the patient and consent obtained.  With the patient in the left lateral decubitus position, the Olympus adult video colonoscope was inserted and advanced under direct visualization.  The prep was excellent and we were able to advance to the cecum.  The right lower quadrant transilluminated and the ileocecal valve seen.  The scope was withdrawn.  The cecum, ascending colon, hepatic flexure, transverse colon, splenic flexure, descending and sigmoid colon were seen well upon withdrawal.  No polyps or other lesions seen.  Scope withdrawn, patient tolerated the procedure well.  ASSESSMENT:  No evidence of further polyps.  PLAN:  Due to the massive polyps found before in large numbers will recommend doing this again in two years. Dictated by:   Llana Aliment. Randa Evens, M.D. Attending Physician:  Orland Mustard DD:  05/02/01 TD:  05/02/01 Job: [id number]

## 2010-10-14 NOTE — Procedures (Signed)
[facility]  Patient:    Billy Carpenter, Billy Carpenter                       MRN: [id number] Proc. Date: 03/27/00 Adm. Date:  [id number] Attending:  Orland Mustard                           Procedure Report  PROCEDURE:  Colonoscopy and polypectomy.  MEDICATIONS:  Fentanyl 62.5 mcg, Versed 7 mg IV.  INDICATION:  Heme-positive stools.  DESCRIPTION OF PROCEDURE:  The procedure had been explained to the patient and consent obtained.  The adult Olympus video colonoscope was inserted, advanced under direct visualization.  A massive polyp was seen in the descending colon upon entering.  We were able to advance to the cecum.  In the tip of the cecum across from the ileocecal valve were two sessile polyps, each approximately 3-5 mm in diameter.  They were removed with the snare and sucked through the scope.  There was no bleeding seen.  The scope was withdrawn and the colon carefully examine.  No polyps were seen in the ascending or transverse colon. In the mid-descending colon approximately 60 cm from the anal verge was a massive polyp estimated to be 2-3 cm in diameter.  It was on a small stalk, and we were actually able to get the giant snare around the polyp, tighten down the stalk, and remove this in one piece.  There was no bleeding at the polypectomy site.  The polyp was recovered in toto down to the rectum, but unfortunately, due to its massive size, we were unable to pull it out with the snare and it transected into several pieces.  Each of these pieces was removed individually, and the smaller pieces were sucked through the scope.  There was no bleeding and no immediate problems.  ASSESSMENT: 1. Massive descending colon polyp, removed. 2. Small cecal polyps, removed.  PLAN:  Will recommend repeating in one year.  Routine postpolypectomy instructions.  Will keep off of anticoagulation, etc. DD:  03/27/00 TD:  03/27/00 Job: [id number]

## 2010-11-30 ENCOUNTER — Other Ambulatory Visit: Payer: Self-pay | Admitting: Cardiology

## 2011-08-11 DIAGNOSIS — Z125 Encounter for screening for malignant neoplasm of prostate: Secondary | ICD-10-CM | POA: Diagnosis not present

## 2011-08-11 DIAGNOSIS — E7800 Pure hypercholesterolemia, unspecified: Secondary | ICD-10-CM | POA: Diagnosis not present

## 2011-08-11 DIAGNOSIS — I10 Essential (primary) hypertension: Secondary | ICD-10-CM | POA: Diagnosis not present

## 2011-08-11 DIAGNOSIS — Z0000 Encounter for general adult medical examination without abnormal findings: Secondary | ICD-10-CM | POA: Diagnosis not present

## 2011-08-11 DIAGNOSIS — J309 Allergic rhinitis, unspecified: Secondary | ICD-10-CM | POA: Diagnosis not present

## 2011-09-04 ENCOUNTER — Other Ambulatory Visit: Payer: Self-pay | Admitting: Gastroenterology

## 2011-09-04 DIAGNOSIS — Z09 Encounter for follow-up examination after completed treatment for conditions other than malignant neoplasm: Secondary | ICD-10-CM | POA: Diagnosis not present

## 2011-09-04 DIAGNOSIS — D126 Benign neoplasm of colon, unspecified: Secondary | ICD-10-CM | POA: Diagnosis not present

## 2011-09-04 DIAGNOSIS — K648 Other hemorrhoids: Secondary | ICD-10-CM | POA: Diagnosis not present

## 2011-09-04 DIAGNOSIS — Z86010 Personal history of colonic polyps: Secondary | ICD-10-CM | POA: Diagnosis not present

## 2011-09-04 DIAGNOSIS — K5730 Diverticulosis of large intestine without perforation or abscess without bleeding: Secondary | ICD-10-CM | POA: Diagnosis not present

## 2011-10-31 ENCOUNTER — Other Ambulatory Visit: Payer: Self-pay | Admitting: Cardiology

## 2011-11-01 DIAGNOSIS — Z85828 Personal history of other malignant neoplasm of skin: Secondary | ICD-10-CM | POA: Diagnosis not present

## 2011-11-01 DIAGNOSIS — L570 Actinic keratosis: Secondary | ICD-10-CM | POA: Diagnosis not present

## 2011-12-03 ENCOUNTER — Other Ambulatory Visit: Payer: Self-pay | Admitting: Cardiology

## 2012-01-29 ENCOUNTER — Other Ambulatory Visit: Payer: Self-pay | Admitting: Cardiology

## 2012-02-02 DIAGNOSIS — L909 Atrophic disorder of skin, unspecified: Secondary | ICD-10-CM | POA: Diagnosis not present

## 2012-02-02 DIAGNOSIS — L919 Hypertrophic disorder of the skin, unspecified: Secondary | ICD-10-CM | POA: Diagnosis not present

## 2012-02-02 DIAGNOSIS — L821 Other seborrheic keratosis: Secondary | ICD-10-CM | POA: Diagnosis not present

## 2012-02-02 DIAGNOSIS — L570 Actinic keratosis: Secondary | ICD-10-CM | POA: Diagnosis not present

## 2012-02-19 DIAGNOSIS — I10 Essential (primary) hypertension: Secondary | ICD-10-CM | POA: Diagnosis not present

## 2012-02-19 DIAGNOSIS — Z79899 Other long term (current) drug therapy: Secondary | ICD-10-CM | POA: Diagnosis not present

## 2012-02-19 DIAGNOSIS — I471 Supraventricular tachycardia: Secondary | ICD-10-CM | POA: Diagnosis not present

## 2012-02-19 DIAGNOSIS — E7800 Pure hypercholesterolemia, unspecified: Secondary | ICD-10-CM | POA: Diagnosis not present

## 2012-02-23 ENCOUNTER — Encounter: Payer: Self-pay | Admitting: Cardiology

## 2012-02-23 DIAGNOSIS — R0789 Other chest pain: Secondary | ICD-10-CM | POA: Insufficient documentation

## 2012-02-23 DIAGNOSIS — I10 Essential (primary) hypertension: Secondary | ICD-10-CM | POA: Insufficient documentation

## 2012-02-23 DIAGNOSIS — IMO0002 Reserved for concepts with insufficient information to code with codable children: Secondary | ICD-10-CM | POA: Insufficient documentation

## 2012-02-23 DIAGNOSIS — I639 Cerebral infarction, unspecified: Secondary | ICD-10-CM | POA: Insufficient documentation

## 2012-02-23 DIAGNOSIS — I340 Nonrheumatic mitral (valve) insufficiency: Secondary | ICD-10-CM | POA: Insufficient documentation

## 2012-02-23 DIAGNOSIS — N400 Enlarged prostate without lower urinary tract symptoms: Secondary | ICD-10-CM | POA: Insufficient documentation

## 2012-02-23 DIAGNOSIS — I471 Supraventricular tachycardia: Secondary | ICD-10-CM | POA: Insufficient documentation

## 2012-02-23 DIAGNOSIS — R319 Hematuria, unspecified: Secondary | ICD-10-CM | POA: Insufficient documentation

## 2012-02-23 DIAGNOSIS — E785 Hyperlipidemia, unspecified: Secondary | ICD-10-CM | POA: Insufficient documentation

## 2012-02-23 DIAGNOSIS — R55 Syncope and collapse: Secondary | ICD-10-CM

## 2012-02-23 DIAGNOSIS — R42 Dizziness and giddiness: Secondary | ICD-10-CM | POA: Insufficient documentation

## 2012-02-23 DIAGNOSIS — R9430 Abnormal result of cardiovascular function study, unspecified: Secondary | ICD-10-CM | POA: Insufficient documentation

## 2012-02-26 ENCOUNTER — Ambulatory Visit (INDEPENDENT_AMBULATORY_CARE_PROVIDER_SITE_OTHER): Payer: Medicare Other | Admitting: Cardiology

## 2012-02-26 ENCOUNTER — Encounter: Payer: Self-pay | Admitting: Cardiology

## 2012-02-26 VITALS — BP 140/90 | HR 67 | Ht 68.0 in | Wt 160.8 lb

## 2012-02-26 DIAGNOSIS — I6350 Cerebral infarction due to unspecified occlusion or stenosis of unspecified cerebral artery: Secondary | ICD-10-CM | POA: Diagnosis not present

## 2012-02-26 DIAGNOSIS — I471 Supraventricular tachycardia: Secondary | ICD-10-CM

## 2012-02-26 DIAGNOSIS — I10 Essential (primary) hypertension: Secondary | ICD-10-CM

## 2012-02-26 DIAGNOSIS — E785 Hyperlipidemia, unspecified: Secondary | ICD-10-CM | POA: Diagnosis not present

## 2012-02-26 DIAGNOSIS — I498 Other specified cardiac arrhythmias: Secondary | ICD-10-CM

## 2012-02-26 DIAGNOSIS — I340 Nonrheumatic mitral (valve) insufficiency: Secondary | ICD-10-CM

## 2012-02-26 DIAGNOSIS — R0789 Other chest pain: Secondary | ICD-10-CM

## 2012-02-26 DIAGNOSIS — I639 Cerebral infarction, unspecified: Secondary | ICD-10-CM

## 2012-02-26 DIAGNOSIS — I059 Rheumatic mitral valve disease, unspecified: Secondary | ICD-10-CM

## 2012-02-26 NOTE — Patient Instructions (Addendum)
Your physician has requested that you have a carotid duplex. This test is an ultrasound of the carotid arteries in your neck. It looks at blood flow through these arteries that supply the brain with blood. Allow one hour for this exam. There are no restrictions or special instructions.  Your physician recommends that you continue on your current medications as directed. Please refer to the Current Medication list given to you today.  Your physician wants you to follow-up in: 1 year. You will receive a reminder letter in the mail two months in advance. If you don't receive a letter, please call our office to schedule the follow-up appointment.  

## 2012-02-26 NOTE — Progress Notes (Signed)
HPI  Should the patient is seen to followup history of supraventricular tachycardia. He had some SVT in the past but we have never actually seen his rhythm. He is very active. He will be running and a half marathon over the next several weeks. In 2010 he had an echo and nuclear study that were normal. In the past 5 or encouraged him to keep his caffeine load down.  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 He was treated with Coumadin at stabilize. He was told that he needed no further workup.  Most recently he's done very well. He does mention one episode of palpitations that last for a few hours several weeks ago. He had no chest pain or shortness of breath with it.  I saw him last in the office in February, 2012. Today I have reviewed all of his old records. I have updated the old data to the new electronic medical record.  No Known Allergies  Current Outpatient Prescriptions  Medication Sig Dispense Refill  . aspirin 325 MG tablet Take 325 mg by mouth daily.      . metoprolol succinate (TOPROL-XL) 25 MG 24 hr tablet TAKE 1 TABLET EVERY MORNING  30 tablet  1  . simvastatin (ZOCOR) 20 MG tablet Take 20 mg by mouth daily.         History   Social History  . Marital Status: Married    Spouse Name: N/A    Number of Children: N/A  . Years of Education: N/A   Occupational History  . Not on file.   Social History Main Topics  . Smoking status: Never Smoker   . Smokeless tobacco: Not on file  . Alcohol Use: Yes  . Drug Use: No  . Sexually Active:    Other Topics Concern  . Not on file   Social History Narrative  . No narrative on file    Family History  Problem Relation Age of Onset  . Arrhythmia Mother     Past Medical History  Diagnosis Date  . Supraventricular tachycardia   . Dyslipidemia   . Hypertension   . CVA (cerebral infarction)     left insular cortex stroke with left  internal carotid artery dissection and near occlusion  . BPH (benign prostatic hyperplasia)   . Hematuria   . Dizziness     some vertigo and some positional  . Ejection fraction     . EF 55%, echo, March, 2010  . Mitral regurgitation     mild, echo, March, 2010  . Chest discomfort     nuclear, March, 2010, no ischemia ( question of decreased ejection fraction but followup echo showed ejection fraction to be 55%)    Past Surgical History  Procedure Date  . Bladder stone removal   . Prostate surgery 2009  . Wrist surgery     Right    ROS   Patient denies fever, chills, headache, sweats, rash, change in vision, change in hearing, chest pain, cough, nausea vomiting, urinary symptoms. All other systems are reviewed and are negative.  PHYSICAL EXAM  Patient is oriented to person time and place. Affect is normal. There is no jugulovenous distention. There no carotid bruits. Lungs are clear. Respiratory effort is nonlabored. Cardiac exam reveals S1 and S2. There are no clicks or significant murmurs. The abdomen is soft. There is no peripheral edema. There are no musculoskeletal deformities. There are no  skin rashes.  Filed Vitals:   02/26/12 1440  BP: 140/90  Pulse: 67  Height: 5\' 8"  (1.727 m)  Weight: 160 lb 12.8 oz (72.938 kg)  SpO2: 98%   EKG is done today and reviewed by me. It is normal.  ASSESSMENT & PLAN

## 2012-02-26 NOTE — Assessment & Plan Note (Signed)
Lipids are being treated. 

## 2012-02-26 NOTE — Assessment & Plan Note (Signed)
Blood pressure is controlled. No change in therapy. 

## 2012-02-26 NOTE — Assessment & Plan Note (Signed)
In the future oh consider followup echo.

## 2012-02-26 NOTE — Assessment & Plan Note (Signed)
The patient has had supraventricular tachycardia in the past but we've never actually seen the rhythm. Several weeks ago he had one episode that lasted a few hours. He has had no recurrence. I've encouraged him to watch his caffeine intake. He will let me know if he begins having more frequent episodes. If so we'll have him wear a 21 day monitor. Otherwise I will not change his medicines. If he has a prolonged episode he'll take a second dose of his metoprolol.

## 2012-02-26 NOTE — Assessment & Plan Note (Signed)
He's had no recurrent chest discomfort.

## 2012-02-26 NOTE — Assessment & Plan Note (Signed)
The patient had spontaneous dissection of his left internal carotid artery in the past. There was near occlusion but it spontaneously improved with Coumadin. I've decided to do a carotid Doppler to be sure that we know the status of his carotids at this time.

## 2012-03-04 ENCOUNTER — Encounter (INDEPENDENT_AMBULATORY_CARE_PROVIDER_SITE_OTHER): Payer: Medicare Other

## 2012-03-04 DIAGNOSIS — R42 Dizziness and giddiness: Secondary | ICD-10-CM

## 2012-03-04 DIAGNOSIS — I6529 Occlusion and stenosis of unspecified carotid artery: Secondary | ICD-10-CM | POA: Diagnosis not present

## 2012-03-04 DIAGNOSIS — I639 Cerebral infarction, unspecified: Secondary | ICD-10-CM

## 2012-03-04 DIAGNOSIS — I10 Essential (primary) hypertension: Secondary | ICD-10-CM

## 2012-03-04 DIAGNOSIS — R0789 Other chest pain: Secondary | ICD-10-CM

## 2012-03-06 ENCOUNTER — Encounter: Payer: Self-pay | Admitting: Cardiology

## 2012-03-06 DIAGNOSIS — I739 Peripheral vascular disease, unspecified: Secondary | ICD-10-CM

## 2012-03-06 DIAGNOSIS — I779 Disorder of arteries and arterioles, unspecified: Secondary | ICD-10-CM | POA: Insufficient documentation

## 2012-03-30 ENCOUNTER — Other Ambulatory Visit: Payer: Self-pay | Admitting: Cardiology

## 2012-05-03 DIAGNOSIS — Z23 Encounter for immunization: Secondary | ICD-10-CM | POA: Diagnosis not present

## 2012-05-27 DIAGNOSIS — M5430 Sciatica, unspecified side: Secondary | ICD-10-CM | POA: Diagnosis not present

## 2012-05-27 DIAGNOSIS — M545 Low back pain, unspecified: Secondary | ICD-10-CM | POA: Diagnosis not present

## 2012-05-27 DIAGNOSIS — M999 Biomechanical lesion, unspecified: Secondary | ICD-10-CM | POA: Diagnosis not present

## 2012-05-28 DIAGNOSIS — M5430 Sciatica, unspecified side: Secondary | ICD-10-CM | POA: Diagnosis not present

## 2012-05-28 DIAGNOSIS — M545 Low back pain, unspecified: Secondary | ICD-10-CM | POA: Diagnosis not present

## 2012-05-28 DIAGNOSIS — M999 Biomechanical lesion, unspecified: Secondary | ICD-10-CM | POA: Diagnosis not present

## 2012-05-30 DIAGNOSIS — M545 Low back pain, unspecified: Secondary | ICD-10-CM | POA: Diagnosis not present

## 2012-05-30 DIAGNOSIS — M999 Biomechanical lesion, unspecified: Secondary | ICD-10-CM | POA: Diagnosis not present

## 2012-05-30 DIAGNOSIS — M5430 Sciatica, unspecified side: Secondary | ICD-10-CM | POA: Diagnosis not present

## 2012-06-03 ENCOUNTER — Other Ambulatory Visit: Payer: Self-pay

## 2012-06-03 MED ORDER — METOPROLOL SUCCINATE ER 25 MG PO TB24: 25.0000 mg | ORAL_TABLET | Freq: Every day | ORAL | Status: DC

## 2012-06-04 DIAGNOSIS — M545 Low back pain, unspecified: Secondary | ICD-10-CM | POA: Diagnosis not present

## 2012-06-04 DIAGNOSIS — M5430 Sciatica, unspecified side: Secondary | ICD-10-CM | POA: Diagnosis not present

## 2012-06-04 DIAGNOSIS — M999 Biomechanical lesion, unspecified: Secondary | ICD-10-CM | POA: Diagnosis not present

## 2012-06-05 DIAGNOSIS — M545 Low back pain, unspecified: Secondary | ICD-10-CM | POA: Diagnosis not present

## 2012-06-05 DIAGNOSIS — M5430 Sciatica, unspecified side: Secondary | ICD-10-CM | POA: Diagnosis not present

## 2012-06-05 DIAGNOSIS — M999 Biomechanical lesion, unspecified: Secondary | ICD-10-CM | POA: Diagnosis not present

## 2012-06-10 DIAGNOSIS — M545 Low back pain, unspecified: Secondary | ICD-10-CM | POA: Diagnosis not present

## 2012-06-10 DIAGNOSIS — M999 Biomechanical lesion, unspecified: Secondary | ICD-10-CM | POA: Diagnosis not present

## 2012-06-10 DIAGNOSIS — M5430 Sciatica, unspecified side: Secondary | ICD-10-CM | POA: Diagnosis not present

## 2012-06-11 DIAGNOSIS — M5430 Sciatica, unspecified side: Secondary | ICD-10-CM | POA: Diagnosis not present

## 2012-06-11 DIAGNOSIS — M999 Biomechanical lesion, unspecified: Secondary | ICD-10-CM | POA: Diagnosis not present

## 2012-06-11 DIAGNOSIS — M545 Low back pain, unspecified: Secondary | ICD-10-CM | POA: Diagnosis not present

## 2012-06-17 DIAGNOSIS — M5430 Sciatica, unspecified side: Secondary | ICD-10-CM | POA: Diagnosis not present

## 2012-06-17 DIAGNOSIS — M545 Low back pain, unspecified: Secondary | ICD-10-CM | POA: Diagnosis not present

## 2012-06-17 DIAGNOSIS — M999 Biomechanical lesion, unspecified: Secondary | ICD-10-CM | POA: Diagnosis not present

## 2012-06-18 DIAGNOSIS — M545 Low back pain, unspecified: Secondary | ICD-10-CM | POA: Diagnosis not present

## 2012-06-18 DIAGNOSIS — M5430 Sciatica, unspecified side: Secondary | ICD-10-CM | POA: Diagnosis not present

## 2012-06-18 DIAGNOSIS — M999 Biomechanical lesion, unspecified: Secondary | ICD-10-CM | POA: Diagnosis not present

## 2012-06-19 DIAGNOSIS — M545 Low back pain, unspecified: Secondary | ICD-10-CM | POA: Diagnosis not present

## 2012-06-19 DIAGNOSIS — M5430 Sciatica, unspecified side: Secondary | ICD-10-CM | POA: Diagnosis not present

## 2012-06-19 DIAGNOSIS — M999 Biomechanical lesion, unspecified: Secondary | ICD-10-CM | POA: Diagnosis not present

## 2012-06-24 DIAGNOSIS — M545 Low back pain, unspecified: Secondary | ICD-10-CM | POA: Diagnosis not present

## 2012-06-24 DIAGNOSIS — M999 Biomechanical lesion, unspecified: Secondary | ICD-10-CM | POA: Diagnosis not present

## 2012-06-24 DIAGNOSIS — M5430 Sciatica, unspecified side: Secondary | ICD-10-CM | POA: Diagnosis not present

## 2012-06-25 DIAGNOSIS — M545 Low back pain, unspecified: Secondary | ICD-10-CM | POA: Diagnosis not present

## 2012-06-25 DIAGNOSIS — M5430 Sciatica, unspecified side: Secondary | ICD-10-CM | POA: Diagnosis not present

## 2012-06-25 DIAGNOSIS — M999 Biomechanical lesion, unspecified: Secondary | ICD-10-CM | POA: Diagnosis not present

## 2012-06-26 DIAGNOSIS — M5430 Sciatica, unspecified side: Secondary | ICD-10-CM | POA: Diagnosis not present

## 2012-06-26 DIAGNOSIS — M545 Low back pain, unspecified: Secondary | ICD-10-CM | POA: Diagnosis not present

## 2012-06-26 DIAGNOSIS — M999 Biomechanical lesion, unspecified: Secondary | ICD-10-CM | POA: Diagnosis not present

## 2012-08-16 DIAGNOSIS — L570 Actinic keratosis: Secondary | ICD-10-CM | POA: Diagnosis not present

## 2012-08-16 DIAGNOSIS — Z85828 Personal history of other malignant neoplasm of skin: Secondary | ICD-10-CM | POA: Diagnosis not present

## 2012-08-16 DIAGNOSIS — D239 Other benign neoplasm of skin, unspecified: Secondary | ICD-10-CM | POA: Diagnosis not present

## 2012-08-19 DIAGNOSIS — E7800 Pure hypercholesterolemia, unspecified: Secondary | ICD-10-CM | POA: Diagnosis not present

## 2012-08-19 DIAGNOSIS — IMO0001 Reserved for inherently not codable concepts without codable children: Secondary | ICD-10-CM | POA: Diagnosis not present

## 2012-08-19 DIAGNOSIS — I10 Essential (primary) hypertension: Secondary | ICD-10-CM | POA: Diagnosis not present

## 2012-08-19 DIAGNOSIS — R0609 Other forms of dyspnea: Secondary | ICD-10-CM | POA: Diagnosis not present

## 2012-08-19 DIAGNOSIS — Z79899 Other long term (current) drug therapy: Secondary | ICD-10-CM | POA: Diagnosis not present

## 2012-08-19 DIAGNOSIS — Z125 Encounter for screening for malignant neoplasm of prostate: Secondary | ICD-10-CM | POA: Diagnosis not present

## 2012-11-28 ENCOUNTER — Other Ambulatory Visit: Payer: Self-pay | Admitting: Cardiology

## 2013-02-20 DIAGNOSIS — Z79899 Other long term (current) drug therapy: Secondary | ICD-10-CM | POA: Diagnosis not present

## 2013-02-20 DIAGNOSIS — Z808 Family history of malignant neoplasm of other organs or systems: Secondary | ICD-10-CM | POA: Diagnosis not present

## 2013-02-20 DIAGNOSIS — I471 Supraventricular tachycardia: Secondary | ICD-10-CM | POA: Diagnosis not present

## 2013-02-20 DIAGNOSIS — I10 Essential (primary) hypertension: Secondary | ICD-10-CM | POA: Diagnosis not present

## 2013-02-20 DIAGNOSIS — E7800 Pure hypercholesterolemia, unspecified: Secondary | ICD-10-CM | POA: Diagnosis not present

## 2013-02-21 ENCOUNTER — Ambulatory Visit (INDEPENDENT_AMBULATORY_CARE_PROVIDER_SITE_OTHER): Payer: Medicare Other | Admitting: Cardiology

## 2013-02-21 ENCOUNTER — Encounter: Payer: Self-pay | Admitting: Cardiology

## 2013-02-21 VITALS — BP 133/79 | HR 65 | Wt 157.0 lb

## 2013-02-21 DIAGNOSIS — R42 Dizziness and giddiness: Secondary | ICD-10-CM | POA: Diagnosis not present

## 2013-02-21 DIAGNOSIS — R0789 Other chest pain: Secondary | ICD-10-CM

## 2013-02-21 DIAGNOSIS — I498 Other specified cardiac arrhythmias: Secondary | ICD-10-CM | POA: Diagnosis not present

## 2013-02-21 DIAGNOSIS — I059 Rheumatic mitral valve disease, unspecified: Secondary | ICD-10-CM

## 2013-02-21 DIAGNOSIS — R319 Hematuria, unspecified: Secondary | ICD-10-CM

## 2013-02-21 DIAGNOSIS — I779 Disorder of arteries and arterioles, unspecified: Secondary | ICD-10-CM | POA: Diagnosis not present

## 2013-02-21 DIAGNOSIS — I471 Supraventricular tachycardia, unspecified: Secondary | ICD-10-CM

## 2013-02-21 DIAGNOSIS — E785 Hyperlipidemia, unspecified: Secondary | ICD-10-CM

## 2013-02-21 DIAGNOSIS — I340 Nonrheumatic mitral (valve) insufficiency: Secondary | ICD-10-CM

## 2013-02-21 DIAGNOSIS — I10 Essential (primary) hypertension: Secondary | ICD-10-CM

## 2013-02-21 NOTE — Assessment & Plan Note (Signed)
He had spontaneous dissection in the past that was treated medically. He had minimal plaque on his carotid Doppler. He does not need a followup Doppler this year. He can have a followup next year.

## 2013-02-21 NOTE — Assessment & Plan Note (Signed)
Blood pressure is controlled. No change in therapy. 

## 2013-02-21 NOTE — Progress Notes (Signed)
Patient ID: Billy Carpenter, male   DOB: September 14, 1945, 67 y.o.   MRN: [id number]   HPI  Patient is seen for cardiology followup. I saw him last one year ago. He's had some mild supraventricular tachycardia in the past. We have actually never seen the rhythm. He remains very active. He will be running and a half marathon later this month. In 2010 his echo and nuclear were normal. He had one brief episode of palpitations in the past year. There was spontaneous resolution.  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 He was treated with Coumadin in the past and it stabilized. He did not need any further workup. We did proceed with a carotid Doppler October, 2013. There was minimal plaque. Therefore it is listed as 1-39% bilateral disease.  He has had some mild muscle discomfort from statin therapy. Currently he is off his statin following with his primary physician with plans for followup labs.  No Known Allergies  Current Outpatient Prescriptions  Medication Sig Dispense Refill  . aspirin 325 MG tablet Take 325 mg by mouth daily.      . metoprolol succinate (TOPROL-XL) 25 MG 24 hr tablet TAKE 1 TABLET BY MOUTH EVERY DAY  30 tablet  5   No current facility-administered medications for this visit.    History   Social History  . Marital Status: Married    Spouse Name: N/A    Number of Children: N/A  . Years of Education: N/A   Occupational History  . Not on file.   Social History Main Topics  . Smoking status: Never Smoker   . Smokeless tobacco: Not on file  . Alcohol Use: Yes  . Drug Use: No  . Sexual Activity:    Other Topics Concern  . Not on file   Social History Narrative  . No narrative on file    Family History  Problem Relation Age of Onset  . Arrhythmia Mother     Past Medical History  Diagnosis Date  . Supraventricular tachycardia   . Dyslipidemia   . Hypertension   . CVA (cerebral infarction)    left insular cortex stroke with left internal carotid artery dissection and near occlusion  . BPH (benign prostatic hyperplasia)   . Hematuria   . Dizziness     some vertigo and some positional  . Ejection fraction     . EF 55%, echo, March, 2010  . Mitral regurgitation     mild, echo, March, 2010  . Chest discomfort     nuclear, March, 2010, no ischemia ( question of decreased ejection fraction but followup echo showed ejection fraction to be 55%)  . Carotid artery disease     Left internal carotid artery spontaneous dissection in the past  //   Doppler, October, 2013, minimal plaque, 0-39% bilateral    Past Surgical History  Procedure Laterality Date  . Bladder stone removal    . Prostate surgery  2009  . Wrist surgery      Right    Patient Active Problem List   Diagnosis Date Noted  . Carotid artery disease   . Supraventricular tachycardia   . Dyslipidemia   . Hypertension   . CVA (cerebral infarction)   . BPH (benign prostatic hyperplasia)   . Hematuria   . Dizziness   . Ejection fraction   . Mitral regurgitation   . Chest discomfort     ROS   Patient denies fever,  chills, headache, sweats, rash, change in vision, change in hearing, chest pain, cough, nausea vomiting, urinary symptoms. All other systems are reviewed and are negative.  PHYSICAL EXAM  Patient is oriented to person time and place. Affect is normal. There is no jugulovenous distention. Lungs are clear. Respiratory effort is nonlabored. Cardiac exam reveals S1 and S2. There no clicks or significant murmurs. The abdomen is soft. There is no peripheral edema. There no musculoskeletal deformities. There are no skin rashes.  Filed Vitals:   02/21/13 0920  BP: 133/79  Pulse: 65  Weight: 157 lb (71.215 kg)   EKG is done today and reviewed by me. There is normal sinus rhythm. There is no change from the prior EKG.  ASSESSMENT & PLAN

## 2013-02-21 NOTE — Assessment & Plan Note (Signed)
The patient had some myalgias from simvastatin. He felt better when he came off of it. His labs will be followed up with his primary team. I have not calculated his current 10 year risk score. However most probably it is greater than 7.5%. This would suggest that statin therapy would be appropriate if all other aspects were appropriate. Since he has had some myalgias, and he likes being off the statin, it would be reasonable not to treat unless he has significant LDL elevation.  As part of today's evaluation I spent greater than 25 minutes total with his care. More than half of this time is been spent with direct contact with him talking about all aspects of his care.

## 2013-02-21 NOTE — Assessment & Plan Note (Signed)
We will plan a followup echo next year.

## 2013-02-21 NOTE — Assessment & Plan Note (Signed)
He had one clinical episode of brief palpitations. We do not need to change his meds. He is on low dose of a beta blocker.

## 2013-02-21 NOTE — Patient Instructions (Addendum)
Your physician recommends that you continue on your current medications as directed. Please refer to the Current Medication list given to you today.  Your physician wants you to follow-up in: 1 year. You will receive a reminder letter in the mail two months in advance. If you don't receive a letter, please call our office to schedule the follow-up appointment.  

## 2013-02-21 NOTE — Assessment & Plan Note (Signed)
The patient has not had any recurrence of chest discomfort over time. No change in therapy.

## 2013-02-21 NOTE — Assessment & Plan Note (Signed)
He has not had any recurrent problems.

## 2013-02-21 NOTE — Assessment & Plan Note (Signed)
He's had some mild positional vertigo over time. No recent symptoms.

## 2013-03-05 DIAGNOSIS — Z23 Encounter for immunization: Secondary | ICD-10-CM | POA: Diagnosis not present

## 2013-03-26 ENCOUNTER — Ambulatory Visit (HOSPITAL_COMMUNITY): Payer: Medicare Other | Attending: Cardiology

## 2013-03-26 DIAGNOSIS — R42 Dizziness and giddiness: Secondary | ICD-10-CM | POA: Diagnosis not present

## 2013-03-26 DIAGNOSIS — E785 Hyperlipidemia, unspecified: Secondary | ICD-10-CM | POA: Diagnosis not present

## 2013-03-26 DIAGNOSIS — Z8673 Personal history of transient ischemic attack (TIA), and cerebral infarction without residual deficits: Secondary | ICD-10-CM | POA: Insufficient documentation

## 2013-03-26 DIAGNOSIS — I10 Essential (primary) hypertension: Secondary | ICD-10-CM | POA: Insufficient documentation

## 2013-03-26 DIAGNOSIS — I658 Occlusion and stenosis of other precerebral arteries: Secondary | ICD-10-CM | POA: Diagnosis not present

## 2013-03-26 DIAGNOSIS — I6529 Occlusion and stenosis of unspecified carotid artery: Secondary | ICD-10-CM

## 2013-04-01 ENCOUNTER — Encounter: Payer: Self-pay | Admitting: Cardiology

## 2013-04-03 ENCOUNTER — Encounter: Payer: Self-pay | Admitting: Cardiology

## 2013-04-03 NOTE — Progress Notes (Signed)
The patient had a carotid Doppler March 26, 2013. There is no major stenoses. He has 1-39% bilateral disease. However there is a high risk plaque with lipid core in the proximal left ICA. There is no associated increased elevation. Statin therapy is recommended. I've called and spoken with the patient. He had been on some simvastatin in the past. He runs a great deal. He felt that the simvastatin was giving him some muscle aches. He stopped it. I explained to him the significant importance of being on a statin going forward because of the finding in his carotid artery. He understands. Together he and I have decided to start atorvastatin 10 mg daily. I'm hoping he will tolerate this. I'm hoping eventually will be able to push to a higher dose. For now we will start with 10 mg of atorvastatin. We will call this into his pharmacy, [facility] [address].

## 2013-04-04 ENCOUNTER — Other Ambulatory Visit: Payer: Self-pay | Admitting: *Deleted

## 2013-04-04 MED ORDER — ATORVASTATIN CALCIUM 10 MG PO TABS: 10.0000 mg | ORAL_TABLET | Freq: Every day | ORAL | Status: DC

## 2013-05-25 ENCOUNTER — Other Ambulatory Visit: Payer: Self-pay | Admitting: Cardiology

## 2013-08-27 ENCOUNTER — Other Ambulatory Visit: Payer: Self-pay | Admitting: Family Medicine

## 2013-08-27 DIAGNOSIS — I471 Supraventricular tachycardia: Secondary | ICD-10-CM | POA: Diagnosis not present

## 2013-08-27 DIAGNOSIS — Z125 Encounter for screening for malignant neoplasm of prostate: Secondary | ICD-10-CM | POA: Diagnosis not present

## 2013-08-27 DIAGNOSIS — I10 Essential (primary) hypertension: Secondary | ICD-10-CM | POA: Diagnosis not present

## 2013-08-27 DIAGNOSIS — Z8051 Family history of malignant neoplasm of kidney: Secondary | ICD-10-CM

## 2013-08-27 DIAGNOSIS — E7800 Pure hypercholesterolemia, unspecified: Secondary | ICD-10-CM | POA: Diagnosis not present

## 2013-08-27 DIAGNOSIS — Z23 Encounter for immunization: Secondary | ICD-10-CM | POA: Diagnosis not present

## 2013-09-03 ENCOUNTER — Other Ambulatory Visit: Payer: Self-pay | Admitting: Dermatology

## 2013-09-03 DIAGNOSIS — L821 Other seborrheic keratosis: Secondary | ICD-10-CM | POA: Diagnosis not present

## 2013-09-03 DIAGNOSIS — L570 Actinic keratosis: Secondary | ICD-10-CM | POA: Diagnosis not present

## 2013-09-03 DIAGNOSIS — D239 Other benign neoplasm of skin, unspecified: Secondary | ICD-10-CM | POA: Diagnosis not present

## 2013-09-03 DIAGNOSIS — B353 Tinea pedis: Secondary | ICD-10-CM | POA: Diagnosis not present

## 2013-09-03 DIAGNOSIS — Z85828 Personal history of other malignant neoplasm of skin: Secondary | ICD-10-CM | POA: Diagnosis not present

## 2013-09-03 DIAGNOSIS — D485 Neoplasm of uncertain behavior of skin: Secondary | ICD-10-CM | POA: Diagnosis not present

## 2013-09-05 ENCOUNTER — Ambulatory Visit
Admission: RE | Admit: 2013-09-05 | Discharge: 2013-09-05 | Disposition: A | Discharge status: Home / Self Care | Payer: Medicare Other | Source: Ambulatory Visit | Attending: Family Medicine | Admitting: Family Medicine

## 2013-09-05 DIAGNOSIS — Z8051 Family history of malignant neoplasm of kidney: Secondary | ICD-10-CM

## 2013-09-05 DIAGNOSIS — N281 Cyst of kidney, acquired: Secondary | ICD-10-CM | POA: Diagnosis not present

## 2013-09-12 ENCOUNTER — Other Ambulatory Visit: Payer: Self-pay | Admitting: Family Medicine

## 2013-09-12 DIAGNOSIS — R9389 Abnormal findings on diagnostic imaging of other specified body structures: Secondary | ICD-10-CM

## 2013-09-17 ENCOUNTER — Ambulatory Visit
Admission: RE | Admit: 2013-09-17 | Discharge: 2013-09-17 | Disposition: A | Discharge status: Home / Self Care | Payer: Medicare Other | Source: Ambulatory Visit | Attending: Family Medicine | Admitting: Family Medicine

## 2013-09-17 DIAGNOSIS — N281 Cyst of kidney, acquired: Secondary | ICD-10-CM | POA: Diagnosis not present

## 2013-09-17 DIAGNOSIS — R9389 Abnormal findings on diagnostic imaging of other specified body structures: Secondary | ICD-10-CM

## 2013-09-17 MED ORDER — IOHEXOL 300 MG/ML  SOLN
100.0000 mL | Freq: Once | INTRAMUSCULAR | Status: AC | PRN
  Administered 2013-09-17: 100 mL via INTRAVENOUS

## 2013-09-18 ENCOUNTER — Other Ambulatory Visit: Payer: Medicare Other

## 2013-10-22 DIAGNOSIS — L570 Actinic keratosis: Secondary | ICD-10-CM | POA: Diagnosis not present

## 2013-10-22 DIAGNOSIS — L821 Other seborrheic keratosis: Secondary | ICD-10-CM | POA: Diagnosis not present

## 2014-02-25 ENCOUNTER — Ambulatory Visit (INDEPENDENT_AMBULATORY_CARE_PROVIDER_SITE_OTHER): Payer: Medicare Other | Admitting: Cardiology

## 2014-02-25 ENCOUNTER — Encounter: Payer: Self-pay | Admitting: Cardiology

## 2014-02-25 VITALS — BP 120/80 | HR 61 | Ht 68.0 in | Wt 156.0 lb

## 2014-02-25 DIAGNOSIS — I739 Peripheral vascular disease, unspecified: Secondary | ICD-10-CM

## 2014-02-25 DIAGNOSIS — I779 Disorder of arteries and arterioles, unspecified: Secondary | ICD-10-CM | POA: Diagnosis not present

## 2014-02-25 DIAGNOSIS — I10 Essential (primary) hypertension: Secondary | ICD-10-CM

## 2014-02-25 DIAGNOSIS — I498 Other specified cardiac arrhythmias: Secondary | ICD-10-CM | POA: Diagnosis not present

## 2014-02-25 DIAGNOSIS — E785 Hyperlipidemia, unspecified: Secondary | ICD-10-CM | POA: Diagnosis not present

## 2014-02-25 DIAGNOSIS — I471 Supraventricular tachycardia: Secondary | ICD-10-CM

## 2014-02-25 DIAGNOSIS — R0789 Other chest pain: Secondary | ICD-10-CM | POA: Diagnosis not present

## 2014-02-25 MED ORDER — ATORVASTATIN CALCIUM 20 MG PO TABS: 20.0000 mg | ORAL_TABLET | Freq: Every day | ORAL | Status: DC

## 2014-02-25 NOTE — Assessment & Plan Note (Signed)
I have ordered he outlined that we are trying to push his statin up and we will start by increasing to Lipitor 20.

## 2014-02-25 NOTE — Progress Notes (Signed)
Patient ID: Billy Carpenter, male   DOB: May 13, 1946, 68 y.o.   MRN: [id number]    HPI  Patient is seen today to followup history of some supraventricular tachycardia and carotid disease. He is doing well. He is active and he continues to do some running. He hopes to run a half marathon next year at the time of his 68th birthday. He did have one episode in March of this year that he related to me. He had played golf. Afterwards as he was getting ready to go home he felt presyncopal. He did not have syncope. Ultimately he improved and has not had a recurrence. There was no residual effect from this episode. He still has some mild palpitations that he has had for many years, but this is stable.  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 This was treated medically. He was treated with Coumadin for a period of time and eventually this was stopped. Most recently last year his followup carotid Doppler did not show any major stenoses. However there was question of a higher risk plaque that was seen in the left internal carotid. There was no stenosis. After seeing this result I was in touch with him. Historically he has had muscle aches from simvastatin. However I was able to convince him to try atorvastatin which he is done successfully without significant problems.  No Known Allergies  Current Outpatient Prescriptions  Medication Sig Dispense Refill  . aspirin 325 MG tablet Take 325 mg by mouth daily.      Marland Kitchen atorvastatin (LIPITOR) 10 MG tablet Take 1 tablet (10 mg total) by mouth daily.  90 tablet  3  . metoprolol succinate (TOPROL-XL) 25 MG 24 hr tablet TAKE 2 TABLET BY MOUTH EVERY DAY       No current facility-administered medications for this visit.    History   Social History  . Marital Status: Married    Spouse Name: N/A    Number of Children: N/A  . Years of Education: N/A   Occupational History  . Not on  file.   Social History Main Topics  . Smoking status: Never Smoker   . Smokeless tobacco: Not on file  . Alcohol Use: Yes  . Drug Use: No  . Sexual Activity:    Other Topics Concern  . Not on file   Social History Narrative  . No narrative on file    Family History  Problem Relation Age of Onset  . Arrhythmia Mother     Past Medical History  Diagnosis Date  . Supraventricular tachycardia   . Dyslipidemia   . Hypertension   . CVA (cerebral infarction)     left insular cortex stroke with left internal carotid artery dissection and near occlusion  . BPH (benign prostatic hyperplasia)   . Hematuria   . Dizziness     some vertigo and some positional  . Ejection fraction     . EF 55%, echo, March, 2010  . Mitral regurgitation     mild, echo, March, 2010  . Chest discomfort     nuclear, March, 2010, no ischemia ( question of decreased ejection fraction but followup echo showed ejection fraction to be 55%)  . Carotid artery disease     Left internal carotid artery spontaneous dissection in the past  //   Doppler, October, 2013, minimal plaque, 0-39% bilateral    Past Surgical History  Procedure Laterality  Date  . Bladder stone removal    . Prostate surgery  2009  . Wrist surgery      Right    Patient Active Problem List   Diagnosis Date Noted  . Carotid artery disease   . Supraventricular tachycardia   . Dyslipidemia   . Hypertension   . CVA (cerebral infarction)   . BPH (benign prostatic hyperplasia)   . Hematuria   . Dizziness   . Ejection fraction   . Mitral regurgitation   . Chest discomfort     ROS   Patient denies fever, chills, headache, sweats, rash, change in vision, change in hearing, chest pain, cough, nausea or vomiting, urinary symptoms. Other systems are reviewed and are negative.  PHYSICAL EXAM  Patient is oriented to person time and place. Affect is normal. Head is atraumatic. Sclera and conjunctiva are normal. There is no jugulovenous  distention. Lungs are clear. Respiratory effort is nonlabored. Cardiac exam reveals S1 and S2. The abdomen is soft. There is no peripheral edema. There are no musculoskeletal deformities. There are no skin rashes.  Filed Vitals:   02/25/14 0941  BP: 120/80  Pulse: 61  Height: 5\' 8"  (1.727 m)  Weight: 156 lb (70.761 kg)   EKG is done today and reviewed by me. There no significant changes. There is normal sinus rhythm.  ASSESSMENT & PLAN

## 2014-02-25 NOTE — Assessment & Plan Note (Signed)
Blood pressure is under excellent control. No change in therapy.

## 2014-02-25 NOTE — Assessment & Plan Note (Signed)
As I have outlined above he has known carotid disease. Spontaneous dissection of his left internal carotid occurred in the past and improve medically. His last Doppler on March 26, 2013 revealed only mild stenoses. However there was one area of plaque that was felt to be higher risk. The recommendation was for statin therapy. He agreed to start Lipitor 10 mg daily which he is taking well. He has not had recurrent muscle aches that he had on simvastatin. Today he agreed to try a higher dose of atorvastatin. My goal would be to try to get him up to 40 mg daily if possible. For now he will try 20 mg. He can arrange with his primary physician to have a followup fasting lipid.

## 2014-02-25 NOTE — Patient Instructions (Signed)
**Note De-Identified Billy Carpenter Obfuscation** Your physician has recommended you make the following change in your medication: increase Atorvastatin to 20 mg at bedtime  Your physician has requested that you have a carotid duplex. This test is an ultrasound of the carotid arteries in your neck. It looks at blood flow through these arteries that supply the brain with blood. Allow one hour for this exam. There are no restrictions or special instructions.  Your physician has requested that you have an echocardiogram. Echocardiography is a painless test that uses sound waves to create images of your heart. It provides your doctor with information about the size and shape of your heart and how well your heart's chambers and valves are working. This procedure takes approximately one hour. There are no restrictions for this procedure.  Your physician wants you to follow-up in: 1 year. You will receive a reminder letter in the mail two months in advance. If you don't receive a letter, please call our office to schedule the follow-up appointment.

## 2014-02-25 NOTE — Assessment & Plan Note (Signed)
Historically he had supraventricular tachycardia. We have actually never seen this arrhythmia. He's had some palpitations over time that are mild. This is helped with beta blocker. There has never been evidence of atrial fibrillation. For now he will remain on the beta blocker. No change in therapy.  As part of today's evaluation I spent greater than 25 minutes with his total care. More than half of this time is been with direct contact with the patient discussing all issues and making plans.

## 2014-02-25 NOTE — Assessment & Plan Note (Signed)
He had some chest discomfort in 2010. He had a nuclear scan showing no ischemia. He also had an echo at that time showing an EF of 55%. He's not having any significant recurrent chest pain. I do feel it is appropriate to proceed with a 2-D echo at this time. We will assess LV function. In addition we will be sure that he has not had a change in his LV function. If this is the case I would be more concerned about his episode of presyncope that occurred in March.

## 2014-03-09 DIAGNOSIS — Z23 Encounter for immunization: Secondary | ICD-10-CM | POA: Diagnosis not present

## 2014-03-09 DIAGNOSIS — I10 Essential (primary) hypertension: Secondary | ICD-10-CM | POA: Diagnosis not present

## 2014-03-09 DIAGNOSIS — I471 Supraventricular tachycardia: Secondary | ICD-10-CM | POA: Diagnosis not present

## 2014-03-09 DIAGNOSIS — E780 Pure hypercholesterolemia: Secondary | ICD-10-CM | POA: Diagnosis not present

## 2014-03-27 ENCOUNTER — Ambulatory Visit (HOSPITAL_BASED_OUTPATIENT_CLINIC_OR_DEPARTMENT_OTHER): Payer: Medicare Other | Admitting: Radiology

## 2014-03-27 ENCOUNTER — Ambulatory Visit (HOSPITAL_COMMUNITY): Payer: Medicare Other | Attending: Cardiology | Admitting: *Deleted

## 2014-03-27 DIAGNOSIS — I6529 Occlusion and stenosis of unspecified carotid artery: Secondary | ICD-10-CM | POA: Diagnosis not present

## 2014-03-27 DIAGNOSIS — Z8673 Personal history of transient ischemic attack (TIA), and cerebral infarction without residual deficits: Secondary | ICD-10-CM | POA: Diagnosis not present

## 2014-03-27 DIAGNOSIS — I471 Supraventricular tachycardia: Secondary | ICD-10-CM

## 2014-03-27 DIAGNOSIS — I10 Essential (primary) hypertension: Secondary | ICD-10-CM | POA: Insufficient documentation

## 2014-03-27 DIAGNOSIS — E785 Hyperlipidemia, unspecified: Secondary | ICD-10-CM | POA: Diagnosis not present

## 2014-03-27 DIAGNOSIS — I779 Disorder of arteries and arterioles, unspecified: Secondary | ICD-10-CM

## 2014-03-27 DIAGNOSIS — I739 Peripheral vascular disease, unspecified: Secondary | ICD-10-CM

## 2014-03-27 DIAGNOSIS — R0789 Other chest pain: Secondary | ICD-10-CM | POA: Diagnosis not present

## 2014-03-27 NOTE — Progress Notes (Signed)
Carotid duplex performed 

## 2014-03-27 NOTE — Progress Notes (Signed)
Echocardiogram performed.  

## 2014-09-09 DIAGNOSIS — I10 Essential (primary) hypertension: Secondary | ICD-10-CM | POA: Diagnosis not present

## 2014-09-09 DIAGNOSIS — E780 Pure hypercholesterolemia: Secondary | ICD-10-CM | POA: Diagnosis not present

## 2014-09-09 DIAGNOSIS — I471 Supraventricular tachycardia: Secondary | ICD-10-CM | POA: Diagnosis not present

## 2014-09-29 DIAGNOSIS — L821 Other seborrheic keratosis: Secondary | ICD-10-CM | POA: Diagnosis not present

## 2014-09-29 DIAGNOSIS — Z85828 Personal history of other malignant neoplasm of skin: Secondary | ICD-10-CM | POA: Diagnosis not present

## 2014-09-29 DIAGNOSIS — L570 Actinic keratosis: Secondary | ICD-10-CM | POA: Diagnosis not present

## 2014-09-29 DIAGNOSIS — D2272 Melanocytic nevi of left lower limb, including hip: Secondary | ICD-10-CM | POA: Diagnosis not present

## 2014-09-29 DIAGNOSIS — L738 Other specified follicular disorders: Secondary | ICD-10-CM | POA: Diagnosis not present

## 2015-03-14 ENCOUNTER — Other Ambulatory Visit: Payer: Self-pay | Admitting: Cardiology

## 2015-03-17 DIAGNOSIS — I10 Essential (primary) hypertension: Secondary | ICD-10-CM | POA: Diagnosis not present

## 2015-03-17 DIAGNOSIS — I471 Supraventricular tachycardia: Secondary | ICD-10-CM | POA: Diagnosis not present

## 2015-03-17 DIAGNOSIS — E7800 Pure hypercholesterolemia, unspecified: Secondary | ICD-10-CM | POA: Diagnosis not present

## 2015-03-17 DIAGNOSIS — Z23 Encounter for immunization: Secondary | ICD-10-CM | POA: Diagnosis not present

## 2015-03-31 ENCOUNTER — Encounter: Payer: Self-pay | Admitting: *Deleted

## 2015-04-05 ENCOUNTER — Encounter: Payer: Self-pay | Admitting: Cardiovascular Disease

## 2015-04-05 ENCOUNTER — Ambulatory Visit (INDEPENDENT_AMBULATORY_CARE_PROVIDER_SITE_OTHER): Payer: Medicare Other | Admitting: Cardiovascular Disease

## 2015-04-05 VITALS — BP 118/94 | HR 57 | Ht 68.0 in | Wt 153.8 lb

## 2015-04-05 DIAGNOSIS — R42 Dizziness and giddiness: Secondary | ICD-10-CM

## 2015-04-05 DIAGNOSIS — R0789 Other chest pain: Secondary | ICD-10-CM | POA: Diagnosis not present

## 2015-04-05 DIAGNOSIS — I779 Disorder of arteries and arterioles, unspecified: Secondary | ICD-10-CM

## 2015-04-05 DIAGNOSIS — I739 Peripheral vascular disease, unspecified: Secondary | ICD-10-CM

## 2015-04-05 DIAGNOSIS — I10 Essential (primary) hypertension: Secondary | ICD-10-CM | POA: Diagnosis not present

## 2015-04-05 NOTE — Patient Instructions (Signed)
Medication Instructions:  Your physician recommends that you continue on your current medications as directed. Please refer to the Current Medication list given to you today.   Labwork: None ordered  Testing/Procedures: None ordered  Follow-Up: Your physician wants you to follow-up in: 1 year with Dr. Nahser. You will receive a reminder letter in the mail two months in advance. If you don't receive a letter, please call our office to schedule the follow-up appointment.   Any Other Special Instructions Will Be Listed Below (If Applicable).     If you need a refill on your cardiac medications before your next appointment, please call your pharmacy. \  

## 2015-04-05 NOTE — Progress Notes (Signed)
Cardiology Office Note   Date:  04/05/2015   ID:  Billy Carpenter, DOB 01-Feb-1946, MRN [id number]  PCP:  Donnie Coffin, MD  Cardiologist:   Acie Fredrickson Wonda Cheng, MD   Chief Complaint  Patient presents with  . Follow-up    spontansous carotid artery dissection    problem list 1. Spontaneous carotid artery dissection - 2001 , associated with a small stroke   2. Supraventricular tachycardia / palpitations    History of Present Illness: Billy Carpenter is a 69 y.o. male who presents for follow up of his SVT. Also has a hx of spontaneous carotic artery dissection of the left carotid .    Is semi-retiured.  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   he ran a half marathon several weeks ago. He's done well. He's not had any chest pain or shortness of breath. He denies any stroke symptoms.   Past Medical History  Diagnosis Date  . Supraventricular tachycardia ([place])   . Dyslipidemia   . Hypertension   . CVA (cerebral infarction)     left insular cortex stroke with left internal carotid artery dissection and near occlusion  . BPH (benign prostatic hyperplasia)   . Hematuria   . Dizziness     some vertigo and some positional  . Ejection fraction     . EF 55%, echo, March, 2010  . Mitral regurgitation     mild, echo, March, 2010  . Chest discomfort     nuclear, March, 2010, no ischemia ( question of decreased ejection fraction but followup echo showed ejection fraction to be 55%)  . Carotid artery disease ([place])     Left internal carotid artery spontaneous dissection in the past  //   Doppler, October, 2013, minimal plaque, 0-39% bilateral    Past Surgical History  Procedure Laterality Date  . Bladder stone removal    . Prostate surgery  2009  . Wrist surgery      Right     Current Outpatient Prescriptions  Medication Sig Dispense Refill  . aspirin 325 MG tablet Take 325 mg by mouth daily.    Marland Kitchen atorvastatin (LIPITOR) 20 MG tablet TAKE 1 TABLET BY MOUTH EVERY DAY  90 tablet 3  . metoprolol succinate (TOPROL-XL) 50 MG 24 hr tablet Take 50 mg by mouth daily.  5   No current facility-administered medications for this visit.    Allergies:   Review of patient's allergies indicates no known allergies.    Social History:  The patient  reports that he has never smoked. He does not have any smokeless tobacco history on file. He reports that he drinks alcohol. He reports that he does not use illicit drugs.   Family History:  The patient's family history includes Arrhythmia in his mother; Arthritis in his mother; Hypertension in his mother; Other in his father; Prostate cancer in his father. There is no history of CVA.    ROS:  Please see the history of present illness.    Review of Systems: Constitutional:  denies fever, chills, diaphoresis, appetite change and fatigue.  HEENT: denies photophobia, eye pain, redness, hearing loss, ear pain, congestion, sore throat, rhinorrhea, sneezing, neck pain, neck stiffness and tinnitus.  Respiratory: denies SOB, DOE, cough, chest tightness, and wheezing.  Cardiovascular: denies chest pain, palpitations and leg swelling.  Gastrointestinal: denies nausea, vomiting, abdominal pain, diarrhea, constipation, blood in stool.  Genitourinary: denies dysuria, urgency, frequency, hematuria, flank pain and difficulty urinating.  Musculoskeletal: denies  myalgias,  back pain, joint swelling, arthralgias and gait problem.   Skin: denies pallor, rash and wound.  Neurological: denies dizziness, seizures, syncope, weakness, light-headedness, numbness and headaches.   Hematological: denies adenopathy, easy bruising, personal or family bleeding history.  Psychiatric/ Behavioral: denies suicidal ideation, mood changes, confusion, nervousness, sleep disturbance and agitation.       All other systems are reviewed and negative.    PHYSICAL EXAM: VS:  BP 118/94 mmHg  Pulse 57  Ht 5\' 8"  (1.727 m)  Wt 153 lb 12.8 oz (69.763 kg)  BMI  23.39 kg/m2 , BMI Body mass index is 23.39 kg/(m^2). GEN: Well nourished, well developed, in no acute distress HEENT: normal Neck: no JVD, carotid bruits, or masses Cardiac: RRR; no murmurs, rubs, or gallops,no edema  Respiratory:  clear to auscultation bilaterally, normal work of breathing GI: soft, nontender, nondistended, + BS MS: no deformity or atrophy Skin: warm and dry, no rash Neuro:  Strength and sensation are intact Psych: normal   EKG:  EKG is ordered today. The ekg ordered today demonstrates  Sinus bradycardia at 57.     Recent Labs: No results found for requested labs within last 365 days.    Lipid Panel No results found for: CHOL, TRIG, HDL, CHOLHDL, VLDL, LDLCALC, LDLDIRECT    Wt Readings from Last 3 Encounters:  04/05/15 153 lb 12.8 oz (69.763 kg)  02/25/14 156 lb (70.761 kg)  02/21/13 157 lb (71.215 kg)      Other studies Reviewed: Additional studies/ records that were reviewed today include: . Review of the above records demonstrates:    ASSESSMENT AND PLAN:  1.   Spontaneous carotid artery dissection: He's doing very well. His last duplex scan  A year ago shows a patent carotid artery.   No further symptoms. We will continue with his current medications.   2. Palpitations: he carries the diagnosis of supraventricular tachycardia. He has occasional episodes of palpitations that last 20 or 30 seconds. He's never had any prolonged episodes. At this point he seems to be relatively benign. I do not think that he needs monitoring at this point.   Current medicines are reviewed at length with the patient today.  The patient does not have concerns regarding medicines.  The following changes have been made:  no change  Labs/ tests ordered today include:   Orders Placed This Encounter  Procedures  . EKG 12-Lead     Disposition:   FU with me in 1 year.      Nahser, Wonda Cheng, MD  04/05/2015 5:34 PM    [place], [place], [place]  [address] Phone: [phone]; Fax: [phone]   [facility]  [address] [place], [place]  [address] [phone]   Fax [phone]

## 2015-04-07 ENCOUNTER — Encounter: Payer: Self-pay | Admitting: Cardiovascular Disease

## 2015-07-21 DIAGNOSIS — M47816 Spondylosis without myelopathy or radiculopathy, lumbar region: Secondary | ICD-10-CM | POA: Diagnosis not present

## 2015-07-21 DIAGNOSIS — M9903 Segmental and somatic dysfunction of lumbar region: Secondary | ICD-10-CM | POA: Diagnosis not present

## 2015-07-22 DIAGNOSIS — M47816 Spondylosis without myelopathy or radiculopathy, lumbar region: Secondary | ICD-10-CM | POA: Diagnosis not present

## 2015-07-22 DIAGNOSIS — M9903 Segmental and somatic dysfunction of lumbar region: Secondary | ICD-10-CM | POA: Diagnosis not present

## 2015-07-26 DIAGNOSIS — M47816 Spondylosis without myelopathy or radiculopathy, lumbar region: Secondary | ICD-10-CM | POA: Diagnosis not present

## 2015-07-26 DIAGNOSIS — M9903 Segmental and somatic dysfunction of lumbar region: Secondary | ICD-10-CM | POA: Diagnosis not present

## 2015-07-27 DIAGNOSIS — M9903 Segmental and somatic dysfunction of lumbar region: Secondary | ICD-10-CM | POA: Diagnosis not present

## 2015-07-27 DIAGNOSIS — M47816 Spondylosis without myelopathy or radiculopathy, lumbar region: Secondary | ICD-10-CM | POA: Diagnosis not present

## 2015-07-28 DIAGNOSIS — M47816 Spondylosis without myelopathy or radiculopathy, lumbar region: Secondary | ICD-10-CM | POA: Diagnosis not present

## 2015-07-28 DIAGNOSIS — M9903 Segmental and somatic dysfunction of lumbar region: Secondary | ICD-10-CM | POA: Diagnosis not present

## 2015-08-02 DIAGNOSIS — M47816 Spondylosis without myelopathy or radiculopathy, lumbar region: Secondary | ICD-10-CM | POA: Diagnosis not present

## 2015-08-02 DIAGNOSIS — M9903 Segmental and somatic dysfunction of lumbar region: Secondary | ICD-10-CM | POA: Diagnosis not present

## 2015-08-04 DIAGNOSIS — M47816 Spondylosis without myelopathy or radiculopathy, lumbar region: Secondary | ICD-10-CM | POA: Diagnosis not present

## 2015-08-04 DIAGNOSIS — M9903 Segmental and somatic dysfunction of lumbar region: Secondary | ICD-10-CM | POA: Diagnosis not present

## 2015-08-09 DIAGNOSIS — M47816 Spondylosis without myelopathy or radiculopathy, lumbar region: Secondary | ICD-10-CM | POA: Diagnosis not present

## 2015-08-09 DIAGNOSIS — M9903 Segmental and somatic dysfunction of lumbar region: Secondary | ICD-10-CM | POA: Diagnosis not present

## 2015-08-11 DIAGNOSIS — M9903 Segmental and somatic dysfunction of lumbar region: Secondary | ICD-10-CM | POA: Diagnosis not present

## 2015-08-11 DIAGNOSIS — M47816 Spondylosis without myelopathy or radiculopathy, lumbar region: Secondary | ICD-10-CM | POA: Diagnosis not present

## 2015-08-13 DIAGNOSIS — J069 Acute upper respiratory infection, unspecified: Secondary | ICD-10-CM | POA: Diagnosis not present

## 2015-08-16 DIAGNOSIS — M47816 Spondylosis without myelopathy or radiculopathy, lumbar region: Secondary | ICD-10-CM | POA: Diagnosis not present

## 2015-08-16 DIAGNOSIS — M9903 Segmental and somatic dysfunction of lumbar region: Secondary | ICD-10-CM | POA: Diagnosis not present

## 2015-08-18 DIAGNOSIS — M47816 Spondylosis without myelopathy or radiculopathy, lumbar region: Secondary | ICD-10-CM | POA: Diagnosis not present

## 2015-08-18 DIAGNOSIS — M9903 Segmental and somatic dysfunction of lumbar region: Secondary | ICD-10-CM | POA: Diagnosis not present

## 2015-08-23 DIAGNOSIS — M9903 Segmental and somatic dysfunction of lumbar region: Secondary | ICD-10-CM | POA: Diagnosis not present

## 2015-08-23 DIAGNOSIS — M47816 Spondylosis without myelopathy or radiculopathy, lumbar region: Secondary | ICD-10-CM | POA: Diagnosis not present

## 2015-08-25 DIAGNOSIS — M47816 Spondylosis without myelopathy or radiculopathy, lumbar region: Secondary | ICD-10-CM | POA: Diagnosis not present

## 2015-08-25 DIAGNOSIS — M9903 Segmental and somatic dysfunction of lumbar region: Secondary | ICD-10-CM | POA: Diagnosis not present

## 2015-09-17 ENCOUNTER — Ambulatory Visit
Admission: RE | Admit: 2015-09-17 | Discharge: 2015-09-17 | Disposition: A | Discharge status: Home / Self Care | Payer: Medicare Other | Source: Ambulatory Visit | Attending: Family Medicine | Admitting: Family Medicine

## 2015-09-17 ENCOUNTER — Other Ambulatory Visit: Payer: Self-pay | Admitting: Family Medicine

## 2015-09-17 DIAGNOSIS — R059 Cough, unspecified: Secondary | ICD-10-CM

## 2015-09-17 DIAGNOSIS — I10 Essential (primary) hypertension: Secondary | ICD-10-CM | POA: Diagnosis not present

## 2015-09-17 DIAGNOSIS — R05 Cough: Secondary | ICD-10-CM

## 2015-09-17 DIAGNOSIS — E7800 Pure hypercholesterolemia, unspecified: Secondary | ICD-10-CM | POA: Diagnosis not present

## 2015-09-17 DIAGNOSIS — I471 Supraventricular tachycardia: Secondary | ICD-10-CM | POA: Diagnosis not present

## 2015-10-08 DIAGNOSIS — D1801 Hemangioma of skin and subcutaneous tissue: Secondary | ICD-10-CM | POA: Diagnosis not present

## 2015-10-08 DIAGNOSIS — D225 Melanocytic nevi of trunk: Secondary | ICD-10-CM | POA: Diagnosis not present

## 2015-10-08 DIAGNOSIS — L821 Other seborrheic keratosis: Secondary | ICD-10-CM | POA: Diagnosis not present

## 2015-10-08 DIAGNOSIS — L570 Actinic keratosis: Secondary | ICD-10-CM | POA: Diagnosis not present

## 2015-10-08 DIAGNOSIS — L814 Other melanin hyperpigmentation: Secondary | ICD-10-CM | POA: Diagnosis not present

## 2015-10-08 DIAGNOSIS — W57XXXA Bitten or stung by nonvenomous insect and other nonvenomous arthropods, initial encounter: Secondary | ICD-10-CM | POA: Diagnosis not present

## 2015-10-08 DIAGNOSIS — Z85828 Personal history of other malignant neoplasm of skin: Secondary | ICD-10-CM | POA: Diagnosis not present

## 2016-02-21 DIAGNOSIS — Z23 Encounter for immunization: Secondary | ICD-10-CM | POA: Diagnosis not present

## 2016-03-15 ENCOUNTER — Other Ambulatory Visit: Payer: Self-pay | Admitting: Cardiology

## 2016-04-12 ENCOUNTER — Encounter: Payer: Self-pay | Admitting: Cardiovascular Disease

## 2016-04-12 ENCOUNTER — Ambulatory Visit (INDEPENDENT_AMBULATORY_CARE_PROVIDER_SITE_OTHER): Payer: Medicare Other | Admitting: Cardiovascular Disease

## 2016-04-12 VITALS — BP 115/80 | HR 70 | Ht 68.0 in | Wt 155.6 lb

## 2016-04-12 DIAGNOSIS — I10 Essential (primary) hypertension: Secondary | ICD-10-CM | POA: Diagnosis not present

## 2016-04-12 DIAGNOSIS — Z8679 Personal history of other diseases of the circulatory system: Secondary | ICD-10-CM

## 2016-04-12 NOTE — Patient Instructions (Signed)
Medication Instructions:  Your physician recommends that you continue on your current medications as directed. Please refer to the Current Medication list given to you today.   Labwork: None Ordered   Testing/Procedures: None Ordered   Follow-Up: Your physician wants you to follow-up in: 1 year with Dr. Nahser.  You will receive a reminder letter in the mail two months in advance. If you don't receive a letter, please call our office to schedule the follow-up appointment.   If you need a refill on your cardiac medications before your next appointment, please call your pharmacy.   Thank you for choosing [facility]! Raynelle Fujikawa, RN [phone]    

## 2016-04-12 NOTE — Progress Notes (Signed)
Cardiology Office Note   Date:  04/12/2016   ID:  KRISTI GETACHEW, DOB 27-Jun-1945, MRN [id number]  PCP:  Donnie Coffin, MD  Cardiologist:   Mertie Moores, MD   Chief Complaint  Patient presents with  . Annual Exam    NO CP, SOB OR SWELLING. NO OTHER COMPLAINTS.     problem list 1. Spontaneous carotid artery dissection - 2001 , associated with a small stroke   2. Supraventricular tachycardia / palpitations      Billy Carpenter is a 70 y.o. male who presents for follow up of his SVT. Also has a hx of spontaneous carotic artery dissection of the left carotid .    Is semi-retiured.  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   he ran a half marathon several weeks ago. He's done well. He's not had any chest pain or shortness of breath. He denies any stroke symptoms.  Nov. 15, 2017:  Doing great.   Decided not to run the [place] 1/2 marathon this year.    Still runs - 3-4 miles a day   BP has been normal at home Avoids salt .     Past Medical History:  Diagnosis Date  . BPH (benign prostatic hyperplasia)   . Carotid artery disease ([place])    Left internal carotid artery spontaneous dissection in the past  //   Doppler, October, 2013, minimal plaque, 0-39% bilateral  . Chest discomfort    nuclear, March, 2010, no ischemia ( question of decreased ejection fraction but followup echo showed ejection fraction to be 55%)  . CVA (cerebral infarction)    left insular cortex stroke with left internal carotid artery dissection and near occlusion  . Dizziness    some vertigo and some positional  . Dyslipidemia   . Ejection fraction    . EF 55%, echo, March, 2010  . Hematuria   . Hypertension   . Mitral regurgitation    mild, echo, March, 2010  . Supraventricular tachycardia [facility])     Past Surgical History:  Procedure Laterality Date  . BLADDER STONE REMOVAL    . PROSTATE SURGERY  2009  . WRIST SURGERY     Right     Current Outpatient Prescriptions  Medication  Sig Dispense Refill  . aspirin 325 MG tablet Take 325 mg by mouth daily.    Marland Kitchen atorvastatin (LIPITOR) 20 MG tablet TAKE 1 TABLET BY MOUTH EVERY DAY 90 tablet 0  . metoprolol succinate (TOPROL-XL) 50 MG 24 hr tablet Take 50 mg by mouth daily.  5   No current facility-administered medications for this visit.     Allergies:   Patient has no known allergies.    Social History:  The patient  reports that he has never smoked. He does not have any smokeless tobacco history on file. He reports that he drinks alcohol. He reports that he does not use drugs.   Family History:  The patient's family history includes Arrhythmia in his mother; Arthritis in his mother; Hypertension in his mother; Other in his father; Prostate cancer in his father.    ROS:  Please see the history of present illness.    Review of Systems: Constitutional:  denies fever, chills, diaphoresis, appetite change and fatigue.  HEENT: denies photophobia, eye pain, redness, hearing loss, ear pain, congestion, sore throat, rhinorrhea, sneezing, neck pain, neck stiffness and tinnitus.  Respiratory: denies SOB, DOE, cough, chest tightness, and wheezing.  Cardiovascular: denies chest pain, palpitations and leg swelling.  Gastrointestinal: denies nausea, vomiting, abdominal pain, diarrhea, constipation, blood in stool.  Genitourinary: denies dysuria, urgency, frequency, hematuria, flank pain and difficulty urinating.  Musculoskeletal: denies  myalgias, back pain, joint swelling, arthralgias and gait problem.   Skin: denies pallor, rash and wound.  Neurological: denies dizziness, seizures, syncope, weakness, light-headedness, numbness and headaches.   Hematological: denies adenopathy, easy bruising, personal or family bleeding history.  Psychiatric/ Behavioral: denies suicidal ideation, mood changes, confusion, nervousness, sleep disturbance and agitation.       All other systems are reviewed and negative.    PHYSICAL EXAM: VS:   BP (!) 124/100 (BP Location: Left Arm, Patient Position: Sitting, Cuff Size: Normal)   Pulse 70   Ht 5\' 8"  (1.727 m)   Wt 155 lb 9.6 oz (70.6 kg)   BMI 23.66 kg/m  , BMI Body mass index is 23.66 kg/m. GEN: Well nourished, well developed, in no acute distress  HEENT: normal  Neck: no JVD, carotid bruits, or masses Cardiac: RRR; no murmurs, rubs, or gallops,no edema  Respiratory:  clear to auscultation bilaterally, normal work of breathing GI: soft, nontender, nondistended, + BS MS: no deformity or atrophy  Skin: warm and dry, no rash Neuro:  Strength and sensation are intact Psych: normal   EKG:  EKG is ordered today. The ekg ordered today demonstrates  NSR at 70 .  Normal ECG    Recent Labs: No results found for requested labs within last 8760 hours.    Lipid Panel No results found for: CHOL, TRIG, HDL, CHOLHDL, VLDL, LDLCALC, LDLDIRECT    Wt Readings from Last 3 Encounters:  04/12/16 155 lb 9.6 oz (70.6 kg)  04/05/15 153 lb 12.8 oz (69.8 kg)  02/25/14 156 lb (70.8 kg)      Other studies Reviewed: Additional studies/ records that were reviewed today include: . Review of the above records demonstrates:    ASSESSMENT AND PLAN:  1.   Spontaneous carotid artery dissection: He's doing very well. His last duplex scan  A year ago shows a patent carotid artery.   No further symptoms. We will continue with his current medications.   2. Palpitations: he carries the diagnosis of supraventricular tachycardia. He has occasional episodes of palpitations that last 20 or 30 seconds. He's never had any prolonged episodes. At this point he seems to be relatively benign. I do not think that he needs monitoring at this point.   Current medicines are reviewed at length with the patient today.  The patient does not have concerns regarding medicines.  The following changes have been made:  no change  Labs/ tests ordered today include:   No orders of the defined types were placed in  this encounter.    Disposition:   FU with me in 1 year.      Mertie Moores, MD  04/12/2016 10:31 AM    [facility] [place], [place], [place]  [address] Phone: [phone]; Fax: [phone]   [facility]  [address] [place], [place]  [address] [phone]   Fax [phone]

## 2016-05-01 DIAGNOSIS — I471 Supraventricular tachycardia: Secondary | ICD-10-CM | POA: Diagnosis not present

## 2016-05-01 DIAGNOSIS — I10 Essential (primary) hypertension: Secondary | ICD-10-CM | POA: Diagnosis not present

## 2016-05-01 DIAGNOSIS — E7800 Pure hypercholesterolemia, unspecified: Secondary | ICD-10-CM | POA: Diagnosis not present

## 2016-06-08 ENCOUNTER — Encounter: Payer: Self-pay | Admitting: Cardiovascular Disease

## 2016-06-08 DIAGNOSIS — M545 Low back pain: Secondary | ICD-10-CM | POA: Diagnosis not present

## 2016-06-08 DIAGNOSIS — M7022 Olecranon bursitis, left elbow: Secondary | ICD-10-CM | POA: Diagnosis not present

## 2016-06-08 DIAGNOSIS — R55 Syncope and collapse: Secondary | ICD-10-CM | POA: Diagnosis not present

## 2016-06-08 DIAGNOSIS — I471 Supraventricular tachycardia: Secondary | ICD-10-CM | POA: Diagnosis not present

## 2016-06-12 ENCOUNTER — Other Ambulatory Visit: Payer: Self-pay | Admitting: Cardiovascular Disease

## 2016-06-13 NOTE — Telephone Encounter (Signed)
Please advise on refill request as patient does not have a lipid panel in epic. Thanks, MI

## 2016-06-19 ENCOUNTER — Other Ambulatory Visit: Payer: Self-pay | Admitting: *Deleted

## 2016-06-19 NOTE — Telephone Encounter (Signed)
Patient called in regards to the rx request for atorvastatin. He stated that his pcp checks his lipids and therefore would refill this medication. He will call his pcp and request that they refill this for him.

## 2016-06-20 ENCOUNTER — Telehealth: Payer: Self-pay | Admitting: Cardiovascular Disease

## 2016-06-20 MED ORDER — ATORVASTATIN CALCIUM 20 MG PO TABS: 20.0000 mg | ORAL_TABLET | Freq: Every day | ORAL | 3 refills | Status: AC

## 2016-06-20 NOTE — Telephone Encounter (Signed)
Left message for patient to call back to find out if lipid panel was done at PCP.  If not, will schedule lipid panel to be done here.

## 2016-06-20 NOTE — Telephone Encounter (Signed)
Spoke with patient who states he had a lipid panel on 09/17/15 with [facility].  Reports the following numbers:   Total Cholesterol 166 HDL 66 Triglycerides 66 LDL 86  He is placing a copy in the mail to [place].  I thanked him for the call back. Atorvastatin 20 mg has been refilled.

## 2016-06-20 NOTE — Telephone Encounter (Signed)
Patient is returning your call.    Thanks

## 2016-08-10 DIAGNOSIS — M545 Low back pain: Secondary | ICD-10-CM | POA: Diagnosis not present

## 2016-08-10 DIAGNOSIS — M5137 Other intervertebral disc degeneration, lumbosacral region: Secondary | ICD-10-CM | POA: Diagnosis not present

## 2016-08-21 DIAGNOSIS — M545 Low back pain: Secondary | ICD-10-CM | POA: Diagnosis not present

## 2016-08-30 DIAGNOSIS — H524 Presbyopia: Secondary | ICD-10-CM | POA: Diagnosis not present

## 2016-08-30 DIAGNOSIS — H5203 Hypermetropia, bilateral: Secondary | ICD-10-CM | POA: Diagnosis not present

## 2016-08-30 DIAGNOSIS — H2513 Age-related nuclear cataract, bilateral: Secondary | ICD-10-CM | POA: Diagnosis not present

## 2016-08-30 DIAGNOSIS — H52203 Unspecified astigmatism, bilateral: Secondary | ICD-10-CM | POA: Diagnosis not present

## 2016-09-08 DIAGNOSIS — M48062 Spinal stenosis, lumbar region with neurogenic claudication: Secondary | ICD-10-CM | POA: Diagnosis not present

## 2016-09-08 DIAGNOSIS — M545 Low back pain: Secondary | ICD-10-CM | POA: Diagnosis not present

## 2016-09-08 DIAGNOSIS — M5137 Other intervertebral disc degeneration, lumbosacral region: Secondary | ICD-10-CM | POA: Diagnosis not present

## 2016-09-27 DIAGNOSIS — M47816 Spondylosis without myelopathy or radiculopathy, lumbar region: Secondary | ICD-10-CM | POA: Diagnosis not present

## 2016-09-27 DIAGNOSIS — M48062 Spinal stenosis, lumbar region with neurogenic claudication: Secondary | ICD-10-CM | POA: Diagnosis not present

## 2016-10-09 DIAGNOSIS — L700 Acne vulgaris: Secondary | ICD-10-CM | POA: Diagnosis not present

## 2016-10-09 DIAGNOSIS — Z86010 Personal history of colonic polyps: Secondary | ICD-10-CM | POA: Diagnosis not present

## 2016-10-09 DIAGNOSIS — L814 Other melanin hyperpigmentation: Secondary | ICD-10-CM | POA: Diagnosis not present

## 2016-10-09 DIAGNOSIS — D1800 Hemangioma unspecified site: Secondary | ICD-10-CM | POA: Diagnosis not present

## 2016-10-09 DIAGNOSIS — D225 Melanocytic nevi of trunk: Secondary | ICD-10-CM | POA: Diagnosis not present

## 2016-10-09 DIAGNOSIS — M4800 Spinal stenosis, site unspecified: Secondary | ICD-10-CM | POA: Diagnosis not present

## 2016-10-09 DIAGNOSIS — L821 Other seborrheic keratosis: Secondary | ICD-10-CM | POA: Diagnosis not present

## 2016-10-09 DIAGNOSIS — L570 Actinic keratosis: Secondary | ICD-10-CM | POA: Diagnosis not present

## 2016-10-09 DIAGNOSIS — Z85828 Personal history of other malignant neoplasm of skin: Secondary | ICD-10-CM | POA: Diagnosis not present

## 2016-10-13 DIAGNOSIS — M48062 Spinal stenosis, lumbar region with neurogenic claudication: Secondary | ICD-10-CM | POA: Diagnosis not present

## 2016-10-13 DIAGNOSIS — M5137 Other intervertebral disc degeneration, lumbosacral region: Secondary | ICD-10-CM | POA: Diagnosis not present

## 2016-10-18 DIAGNOSIS — M48062 Spinal stenosis, lumbar region with neurogenic claudication: Secondary | ICD-10-CM | POA: Diagnosis not present

## 2016-10-30 DIAGNOSIS — M48062 Spinal stenosis, lumbar region with neurogenic claudication: Secondary | ICD-10-CM | POA: Diagnosis not present

## 2016-10-30 DIAGNOSIS — M5137 Other intervertebral disc degeneration, lumbosacral region: Secondary | ICD-10-CM | POA: Diagnosis not present

## 2016-11-01 DIAGNOSIS — I10 Essential (primary) hypertension: Secondary | ICD-10-CM | POA: Diagnosis not present

## 2016-11-01 DIAGNOSIS — E7800 Pure hypercholesterolemia, unspecified: Secondary | ICD-10-CM | POA: Diagnosis not present

## 2016-11-01 DIAGNOSIS — I471 Supraventricular tachycardia: Secondary | ICD-10-CM | POA: Diagnosis not present

## 2016-11-12 ENCOUNTER — Emergency Department (HOSPITAL_COMMUNITY)
Admission: EM | Admit: 2016-11-12 | Discharge: 2016-11-12 | Disposition: A | Discharge status: Home / Self Care | Payer: Medicare Other | Attending: Emergency Medicine | Admitting: Emergency Medicine

## 2016-11-12 ENCOUNTER — Emergency Department (HOSPITAL_COMMUNITY): Payer: Medicare Other

## 2016-11-12 ENCOUNTER — Encounter (HOSPITAL_COMMUNITY): Payer: Self-pay | Admitting: Emergency Medicine

## 2016-11-12 DIAGNOSIS — Z8673 Personal history of transient ischemic attack (TIA), and cerebral infarction without residual deficits: Secondary | ICD-10-CM | POA: Insufficient documentation

## 2016-11-12 DIAGNOSIS — I10 Essential (primary) hypertension: Secondary | ICD-10-CM | POA: Insufficient documentation

## 2016-11-12 DIAGNOSIS — R42 Dizziness and giddiness: Secondary | ICD-10-CM | POA: Insufficient documentation

## 2016-11-12 DIAGNOSIS — I2510 Atherosclerotic heart disease of native coronary artery without angina pectoris: Secondary | ICD-10-CM | POA: Diagnosis not present

## 2016-11-12 DIAGNOSIS — R404 Transient alteration of awareness: Secondary | ICD-10-CM | POA: Diagnosis not present

## 2016-11-12 DIAGNOSIS — Z7982 Long term (current) use of aspirin: Secondary | ICD-10-CM | POA: Insufficient documentation

## 2016-11-12 DIAGNOSIS — R531 Weakness: Secondary | ICD-10-CM | POA: Diagnosis not present

## 2016-11-12 DIAGNOSIS — Z79899 Other long term (current) drug therapy: Secondary | ICD-10-CM | POA: Insufficient documentation

## 2016-11-12 LAB — CBC WITH DIFFERENTIAL/PLATELET
BASOS PCT: 0 %
Basophils Absolute: 0 10*3/uL (ref 0.0–0.1)
EOS ABS: 0.1 10*3/uL (ref 0.0–0.7)
Eosinophils Relative: 1 %
HCT: 43.1 % (ref 39.0–52.0)
HEMOGLOBIN: 15 g/dL (ref 13.0–17.0)
LYMPHS ABS: 1 10*3/uL (ref 0.7–4.0)
Lymphocytes Relative: 16 %
MCH: 33.3 pg (ref 26.0–34.0)
MCHC: 34.8 g/dL (ref 30.0–36.0)
MCV: 95.8 fL (ref 78.0–100.0)
MONOS PCT: 7 %
Monocytes Absolute: 0.5 10*3/uL (ref 0.1–1.0)
NEUTROS PCT: 76 %
Neutro Abs: 4.8 10*3/uL (ref 1.7–7.7)
PLATELETS: 161 10*3/uL (ref 150–400)
RBC: 4.5 MIL/uL (ref 4.22–5.81)
RDW: 13.1 % (ref 11.5–15.5)
WBC: 6.4 10*3/uL (ref 4.0–10.5)

## 2016-11-12 LAB — COMPREHENSIVE METABOLIC PANEL
ALBUMIN: 3.8 g/dL (ref 3.5–5.0)
ALT: 19 U/L (ref 17–63)
AST: 22 U/L (ref 15–41)
Alkaline Phosphatase: 69 U/L (ref 38–126)
Anion gap: 6 (ref 5–15)
BILIRUBIN TOTAL: 1.1 mg/dL (ref 0.3–1.2)
BUN: 13 mg/dL (ref 6–20)
CO2: 26 mmol/L (ref 22–32)
CREATININE: 0.94 mg/dL (ref 0.61–1.24)
Calcium: 8.9 mg/dL (ref 8.9–10.3)
Chloride: 106 mmol/L (ref 101–111)
GFR calc Af Amer: >60 mL/min (ref >60)
Glucose, Bld: 105 mg/dL — ABNORMAL HIGH (ref 65–99)
Potassium: 4 mmol/L (ref 3.5–5.1)
SODIUM: 138 mmol/L (ref 135–145)
TOTAL PROTEIN: 6.3 g/dL — AB (ref 6.5–8.1)

## 2016-11-12 LAB — I-STAT TROPONIN, ED: TROPONIN I, POC: 0 ng/mL (ref 0.00–0.08)

## 2016-11-12 LAB — CK: Total CK: 86 U/L (ref 49–397)

## 2016-11-12 MED ORDER — MECLIZINE HCL 25 MG PO TABS: 25.0000 mg | ORAL_TABLET | Freq: Three times a day (TID) | ORAL | 0 refills | Status: DC | PRN

## 2016-11-12 MED ORDER — DIAZEPAM 2 MG PO TABS
2.0000 mg | ORAL_TABLET | Freq: Once | ORAL | Status: AC
  Administered 2016-11-12: 2 mg via ORAL
  Filled 2016-11-12: qty 1

## 2016-11-12 MED ORDER — MECLIZINE HCL 25 MG PO TABS
25.0000 mg | ORAL_TABLET | Freq: Once | ORAL | Status: AC
  Administered 2016-11-12: 25 mg via ORAL
  Filled 2016-11-12: qty 1

## 2016-11-12 MED ORDER — DIAZEPAM 5 MG PO TABS
5.0000 mg | ORAL_TABLET | Freq: Once | ORAL | Status: DC
  Filled 2016-11-12: qty 1

## 2016-11-12 MED ORDER — SODIUM CHLORIDE 0.9 % IV BOLUS (SEPSIS)
1000.0000 mL | Freq: Once | INTRAVENOUS | Status: AC
  Administered 2016-11-12: 1000 mL via INTRAVENOUS

## 2016-11-12 NOTE — ED Provider Notes (Signed)
[facility] DEPT Provider Note   CSN: [id number] Arrival date & time: 11/12/16  2202     History   Chief Complaint Chief Complaint  Patient presents with  . Dizziness    HPI Billy Carpenter is a 71 y.o. male.  HPI Billy Carpenter is a 71 y.o. male with history of prior CVA caused by a carotid artery dissection in 2013, hypertension, mitral regurgitation, SVT, presents to emergency department with complaint of dizziness. Patient states that his dizziness began after mowing lawn and a heat 2 days ago. His states that his dizziness is worse with movement, standing up, walking, head position changes. He states he feels like he is going to "pass out." He has been drinking plenty of fluid to see if that will fix the problem, however his symptoms are not improving. He denies any fever or chills. Denies headache. No chest pain or abdominal pain. No shortness of breath. He states "I just don't feel good, have no appetite, and fell dizzy." He states when he is ambulating has to hold onto things. This morning his wife measured his blood pressure and it was 542 systolic. He states that his normal blood pressures in 120s. States he just recently had a physical and was told everything was normal.  Past Medical History:  Diagnosis Date  . BPH (benign prostatic hyperplasia)   . Carotid artery disease ([place])    Left internal carotid artery spontaneous dissection in the past  //   Doppler, October, 2013, minimal plaque, 0-39% bilateral  . Chest discomfort    nuclear, March, 2010, no ischemia ( question of decreased ejection fraction but followup echo showed ejection fraction to be 55%)  . CVA (cerebral infarction)    left insular cortex stroke with left internal carotid artery dissection and near occlusion  . Dizziness    some vertigo and some positional  . Dyslipidemia   . Ejection fraction    . EF 55%, echo, March, 2010  . Hematuria   . Hypertension   . Mitral regurgitation    mild, echo,  March, 2010  . Supraventricular tachycardia [facility])     Patient Active Problem List   Diagnosis Date Noted  . History of carotid artery dissection 04/12/2016  . Carotid artery disease ([place])   . Supraventricular tachycardia ([place])   . Dyslipidemia   . Hypertension   . CVA (cerebral infarction)   . BPH (benign prostatic hyperplasia)   . Hematuria   . Dizziness   . Ejection fraction   . Mitral regurgitation   . Chest discomfort     Past Surgical History:  Procedure Laterality Date  . BLADDER STONE REMOVAL    . PROSTATE SURGERY  2009  . WRIST SURGERY     Right       Home Medications    Prior to Admission medications   Medication Sig Start Date End Date Taking? Authorizing Provider  aspirin 325 MG tablet Take 325 mg by mouth daily.   Yes [provider]  atorvastatin (LIPITOR) 20 MG tablet Take 1 tablet (20 mg total) by mouth daily. 06/20/16  Yes Nahser, Wonda Cheng, MD  metoprolol succinate (TOPROL-XL) 50 MG 24 hr tablet Take 50 mg by mouth daily. 02/28/15  Yes [provider]    Family History Family History  Problem Relation Age of Onset  . Arrhythmia Mother   . Hypertension Mother   . Arthritis Mother   . Other Father        tumor  .  Prostate cancer Father   . CVA Neg Hx     Social History Social History  Substance Use Topics  . Smoking status: Never Smoker  . Smokeless tobacco: Not on file  . Alcohol use Yes     Allergies   Patient has no known allergies.   Review of Systems Review of Systems  Constitutional: Positive for fatigue. Negative for chills and fever.  Respiratory: Negative for cough, chest tightness and shortness of breath.   Cardiovascular: Negative for chest pain, palpitations and leg swelling.  Gastrointestinal: Negative for abdominal distention, abdominal pain, diarrhea, nausea and vomiting.  Genitourinary: Negative for dysuria, frequency, hematuria and urgency.  Musculoskeletal: Positive for back pain. Negative for  arthralgias, myalgias, neck pain and neck stiffness.  Skin: Negative for rash.  Allergic/Immunologic: Negative for immunocompromised state.  Neurological: Positive for dizziness, weakness and light-headedness. Negative for numbness and headaches.  All other systems reviewed and are negative.    Physical Exam Updated Vital Signs BP (!) 156/107 (BP Location: Right Arm)   Pulse (!) 54   Temp 97.9 F (36.6 C) (Oral)   Resp 20   SpO2 99%   Physical Exam  Constitutional: He is oriented to person, place, and time. He appears well-developed and well-nourished. No distress.  HENT:  Head: Normocephalic and atraumatic.  Eyes: Conjunctivae and EOM are normal. Pupils are equal, round, and reactive to light.  Horizontal nystagmus  Neck: Neck supple.  Cardiovascular: Normal rate, regular rhythm and normal heart sounds.   Pulmonary/Chest: Effort normal. No respiratory distress. He has no wheezes. He has no rales.  Abdominal: Soft. Bowel sounds are normal. He exhibits no distension. There is no tenderness. There is no rebound.  Musculoskeletal: He exhibits no edema.  Neurological: He is alert and oriented to person, place, and time. He displays normal reflexes. No cranial nerve deficit. He exhibits normal muscle tone. Coordination normal.  5/5 and equal upper and lower extremity strength bilaterally. Equal grip strength bilaterally. Normal finger to nose and heel to shin. No pronator drift.  Skin: Skin is warm and dry.  Nursing note and vitals reviewed.    ED Treatments / Results  Labs (all labs ordered are listed, but only abnormal results are displayed) Labs Reviewed  COMPREHENSIVE METABOLIC PANEL - Abnormal; Notable for the following:       Result Value   Glucose, Bld 105 (*)    Total Protein 6.3 (*)    All other components within normal limits  CBC WITH DIFFERENTIAL/PLATELET  CK  URINALYSIS, ROUTINE W REFLEX MICROSCOPIC  I-STAT TROPOININ, ED    EKG  EKG  Interpretation  Date/Time:  Sunday November 12 2016 08:32:50 EDT Ventricular Rate:  52 PR Interval:    QRS Duration: 89 QT Interval:  472 QTC Calculation: 439 R Axis:   5 Text Interpretation:  Sinus rhythm Confirmed by Charlesetta Shanks [phone]) on 11/12/2016 10:28:10 AM       Radiology Mr Brain Wo Contrast  Result Date: 11/12/2016 CLINICAL DATA:  Vertigo EXAM: MRI HEAD WITHOUT CONTRAST TECHNIQUE: Multiplanar, multiecho pulse sequences of the brain and surrounding structures were obtained without intravenous contrast. COMPARISON:  None. FINDINGS: Brain: Negative for acute infarct. Chronic left insular and upper articular infarct. Mild chronic ischemic change in the white matter. Brainstem intact. Negative for hemorrhage or mass. Ventricle size normal. No shift of the midline structures Vascular: Normal arterial flow voids. Skull and upper cervical spine: No acute skeletal abnormality. Disc degeneration and spurring C3-4 Sinuses/Orbits: Negative Other: None IMPRESSION: Chronic  left MCA infarct.  No acute abnormality. Electronically Signed   By: Franchot Gallo M.D.   On: 11/12/2016 14:04    Procedures Procedures (including critical care time)  Medications Ordered in ED Medications  sodium chloride 0.9 % bolus 1,000 mL (0 mLs Intravenous Stopped 11/12/16 1042)  meclizine (ANTIVERT) tablet 25 mg (25 mg Oral Given 11/12/16 0931)  diazepam (VALIUM) tablet 2 mg (2 mg Oral Given 11/12/16 0931)  meclizine (ANTIVERT) tablet 25 mg (25 mg Oral Given 11/12/16 1452)     Initial Impression / Assessment and Plan / ED Course  I have reviewed the triage vital signs and the nursing notes.  Pertinent labs & imaging results that were available during my care of the patient were reviewed by me and considered in my medical decision making (see chart for details).     Patient seen and examined. Patient with increased dizziness after mowing lawn 2 days ago. Symptoms he is describing sound more like vertigo,  however he states he does feel like he is going to faint. On exam, horizontal nystagmus present. Dizziness worsened by position changes of the head, but also by sitting up and standing up. Will check labs, orthostatic vital signs, administer fluids, try Valium and meclizine for dizziness.  Labs normal. MR brain ordered to ro cerebellar stroke.   2:38 PM MR with no acute findings. Most liked BPV as the cause of pt's vertigo like symptoms.  Pt ambulated well. Feels a lot better. Plan to dc home with meclizine and close PCP follow up.    Vitals:   11/12/16 1215 11/12/16 1230 11/12/16 1300 11/12/16 1445  BP: (!) 138/94 (!) 134/94 (!) 145/97 (!) 134/91  Pulse: (!) 51 (!) 51 (!) 56 (!) 55  Resp: 20 18 (!) 24 13  Temp:      TempSrc:      SpO2: 96% 97% 95% 97%    Final Clinical Impressions(s) / ED Diagnoses   Final diagnoses:  Vertigo    New Prescriptions Discharge Medication List as of 11/12/2016  2:50 PM    START taking these medications   Details  meclizine (ANTIVERT) 25 MG tablet Take 1 tablet (25 mg total) by mouth 3 (three) times daily as needed for dizziness., Starting Sun 11/12/2016, Print         Azalea Cedar, Lahoma Rocker, PA-C 11/12/16 1626    Charlesetta Shanks, MD 12/04/16 1446

## 2016-11-12 NOTE — ED Triage Notes (Addendum)
Pt arrives via [facility] for c/o dizziness/lightheadedness that began Friday around 1530 while patient was working outside. Ems reports pt stated dizziness got better then returned on Saturday and was much worse this am. Pt a/ox4, resp e/u. Denies any weakness in limbs or difficulty speaking during any of his dizziness episodes. No neuro defs noted at this time. Reports taking his metoprolol this am

## 2016-11-12 NOTE — ED Notes (Signed)
ED Provider at bedside. 

## 2016-11-12 NOTE — ED Provider Notes (Addendum)
Medical screening examination/treatment/procedure(s) were conducted as a shared visit with non-physician practitioner(s) and myself.  I personally evaluated the patient during the encounter.   EKG Interpretation  Date/Time:  Sunday November 12 2016 08:32:50 EDT Ventricular Rate:  52 PR Interval:    QRS Duration: 89 QT Interval:  472 QTC Calculation: 439 R Axis:   5 Text Interpretation:  Sinus rhythm Confirmed by Charlesetta Shanks [phone]) on 11/12/2016 10:28:10 AM     Patient had been mowing his yard on Friday when he started to notice he was dizzy. He thought it was due to the heat and so he rested and drank some water. He has however persisted now throughout the ensuing 2 days to have a quality of gait instability and dizziness without focal weakness numbness or tingling. On examination patient is alert and appropriate. Cognitive function is normal. ENT exam is normal. Finger-nose exam normal. Motor strength normal. Heart and lungs normal. No peripheral edema. I have directly supervised management and treatment.. I agree with plan of management.   Charlesetta Shanks, MD 11/12/16 1027    Charlesetta Shanks, MD 11/12/16 1028

## 2016-11-12 NOTE — ED Notes (Signed)
Patient transported to MRI 

## 2016-11-12 NOTE — Discharge Instructions (Signed)
Take meclizine as prescribed as needed for dizziness. Stand up slowly. Continue to drink plenty of fluids. Follow up with family doctor this week.

## 2016-11-12 NOTE — ED Notes (Signed)
MRI contacted on status of when patient may come over for scan, MRI tech advised patient is next to come over on their list.

## 2016-11-15 DIAGNOSIS — R42 Dizziness and giddiness: Secondary | ICD-10-CM | POA: Diagnosis not present

## 2016-11-23 DIAGNOSIS — M545 Low back pain: Secondary | ICD-10-CM | POA: Diagnosis not present

## 2016-11-23 DIAGNOSIS — M5137 Other intervertebral disc degeneration, lumbosacral region: Secondary | ICD-10-CM | POA: Diagnosis not present

## 2016-12-04 DIAGNOSIS — M48062 Spinal stenosis, lumbar region with neurogenic claudication: Secondary | ICD-10-CM | POA: Diagnosis not present

## 2016-12-08 DIAGNOSIS — M48062 Spinal stenosis, lumbar region with neurogenic claudication: Secondary | ICD-10-CM | POA: Diagnosis not present

## 2016-12-13 DIAGNOSIS — M48062 Spinal stenosis, lumbar region with neurogenic claudication: Secondary | ICD-10-CM | POA: Diagnosis not present

## 2016-12-15 DIAGNOSIS — Z86010 Personal history of colonic polyps: Secondary | ICD-10-CM | POA: Diagnosis not present

## 2016-12-15 DIAGNOSIS — K640 First degree hemorrhoids: Secondary | ICD-10-CM | POA: Diagnosis not present

## 2016-12-15 DIAGNOSIS — K5730 Diverticulosis of large intestine without perforation or abscess without bleeding: Secondary | ICD-10-CM | POA: Diagnosis not present

## 2016-12-28 ENCOUNTER — Telehealth: Payer: Self-pay | Admitting: Nurse Practitioner

## 2016-12-28 NOTE — Telephone Encounter (Signed)
Billy Carpenter is calling for some irregular heartbeat issues that  Billy Carpenter is having that she is wanting to have a follow up conversation about . Please call

## 2016-12-28 NOTE — Telephone Encounter (Signed)
Left message on machine for pt to contact the office.  This is a Dr Acie Fredrickson patient.

## 2017-01-01 ENCOUNTER — Encounter: Payer: Self-pay | Admitting: Neurology

## 2017-01-01 ENCOUNTER — Ambulatory Visit (INDEPENDENT_AMBULATORY_CARE_PROVIDER_SITE_OTHER): Payer: Medicare Other | Admitting: Neurology

## 2017-01-01 VITALS — BP 139/93 | HR 61 | Ht 68.0 in | Wt 155.5 lb

## 2017-01-01 DIAGNOSIS — R269 Unspecified abnormalities of gait and mobility: Secondary | ICD-10-CM | POA: Diagnosis not present

## 2017-01-01 DIAGNOSIS — E538 Deficiency of other specified B group vitamins: Secondary | ICD-10-CM

## 2017-01-01 DIAGNOSIS — R42 Dizziness and giddiness: Secondary | ICD-10-CM

## 2017-01-01 DIAGNOSIS — R55 Syncope and collapse: Secondary | ICD-10-CM | POA: Diagnosis not present

## 2017-01-01 NOTE — Progress Notes (Signed)
Reason for visit: Low back pain  Referring physician: Dr. Ranae Plumber is a 71 y.o. male  History of present illness:  [name] is a 71 year old right-handed white male with a history of low back pain that started in January 2018. The patient has pain in the lower portion of the right back with some discomfort that may go down into the medial and lateral aspect of the thigh, but he does not have pain below the knee. He does not believe that there is any weakness of either leg. The patient has noted that the pain is more prominent when he stands upright, he does better when he is able to bend and stoop and twist. He does not have pain when he is sitting or lying down. He had to stop running on a regular basis in the beginning of April 2018 because of the back pain. The patient has been seen and evaluated by Dr. Nelva Bush, the patient has evidence of facet joint arthritis with severe degeneration L5-S1 level and retrolisthesis of the L3-4 level. There may be lateral recess stenosis at L4-5 level with possible L5 nerve root involvement. The patient denies any numbness in the extremities. He does also note some problems with gait instability, he feels somewhat imbalanced. He relates these feelings of imbalance to episodes of palpitations of the heart. He has had several events of near-syncope. He has had a history of supraventricular tachycardia in the past, but the events of increased heart rate and gait instability are becoming more frequent and may last up to one hour at a time. They are generally occurring when he is up and active, they do not occur at nighttime when he is sleeping. He has undergone an epidural steroid injection and a facet joint injection without much benefit, he will be seeing Dr. Nelva Bush in the near future for another injection. He does report some stiffness in the back. The patient has recently undergone MRI evaluation of the brain that showed a chronic left middle cerebral  artery distribution stroke, but no acute changes were seen.  Past Medical History:  Diagnosis Date  . BPH (benign prostatic hyperplasia)   . 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 Chest discomfort    nuclear, March, 2010, no ischemia ( question of decreased ejection fraction but followup echo showed ejection fraction to be 55%)  . CVA (cerebral infarction)    left insular cortex stroke with left internal carotid artery dissection and near occlusion  . Dizziness    some vertigo and some positional  . Dyslipidemia   . Ejection fraction    . EF 55%, echo, March, 2010  . Hematuria   . Hypertension   . Mitral regurgitation    mild, echo, March, 2010  . Supraventricular tachycardia [facility])     Past Surgical History:  Procedure Laterality Date  . BLADDER STONE REMOVAL    . PROSTATE SURGERY  2009  . WRIST SURGERY     Right    Family History  Problem Relation Age of Onset  . Arrhythmia Mother   . Hypertension Mother   . Arthritis Mother   . Other Father        tumor  . Prostate cancer Father   . CVA Neg Hx     Social history:  reports that he has never smoked. He has never  used smokeless tobacco. He reports that he drinks alcohol. He reports that he does not use drugs.  Medications:  Prior to Admission medications   Medication Sig Start Date End Date Taking? Authorizing Provider  aspirin 325 MG tablet Take 325 mg by mouth daily.   Yes [provider]  atorvastatin (LIPITOR) 20 MG tablet Take 1 tablet (20 mg total) by mouth daily. 06/20/16  Yes Nahser, Wonda Cheng, MD  metoprolol succinate (TOPROL-XL) 50 MG 24 hr tablet Take 50 mg by mouth daily. 02/28/15  Yes [provider]     No Known Allergies  ROS:  Out of a complete 14 system review of symptoms, the patient complains only of the following symptoms, and all other reviewed systems are  negative.  Palpitations of the heart Shortness of breath Dizziness, near syncope decreased energy Snoring   Blood pressure (!) 139/93, pulse 61, height 5\' 8"  (1.727 m), weight 155 lb 8 oz (70.5 kg).  Physical Exam  General: The patient is alert and cooperative at the time of the examination.  Eyes: Pupils are equal, round, and reactive to light. Discs are flat bilaterally.  Neck: The neck is supple, no carotid bruits are noted.  Respiratory: The respiratory examination is clear.  Cardiovascular: The cardiovascular examination reveals a regular rate and rhythm, no obvious murmurs or rubs are noted.  Skin: Extremities are without significant edema.  Neurologic Exam  Mental status: The patient is alert and oriented x 3 at the time of the examination. The patient has apparent normal recent and remote memory, with an apparently normal attention span and concentration ability.  Cranial nerves: Facial symmetry is present. There is good sensation of the face to pinprick and soft touch bilaterally. The strength of the facial muscles and the muscles to head turning and shoulder shrug are normal bilaterally. Speech is well enunciated, no aphasia or dysarthria is noted. Extraocular movements are full. Visual fields are full. The tongue is midline, and the patient has symmetric elevation of the soft palate. No obvious hearing deficits are noted.  Motor: The motor testing reveals 5 over 5 strength of all 4 extremities. Good symmetric motor tone is noted throughout.  Sensory: Sensory testing is intact to pinprick, soft touch, vibration sensation, and position sense on all 4 extremities. No evidence of extinction is noted.  Coordination: Cerebellar testing reveals good finger-nose-finger and heel-to-shin bilaterally.  Gait and station: Gait is slightly wide-based. Tandem gait is slightly unsteady.  Romberg is negative. No drift is seen.  Reflexes: Deep tendon reflexes are symmetric and normal  bilaterally.  ankle jerk reflexes are well-maintained bilaterally. Toes are downgoing bilaterally.   MRI brain 11/12/16:  IMPRESSION: Chronic left MCA infarct.  No acute abnormality.   Assessment/Plan:  1. Mild gait instability   2. Low back pain, right thigh pain   3. Episodes of near syncope, palpitations of the heart   The patient is having episodes of near-syncope, we will need to get a cardiac event monitor to exclude the presence of atrial fibrillation. The patient is having back pain that goes away with sitting or lying down, he feels better when he is up and moving, but if he is standing upright the pain bothersome. This likely represents a pain syndrome associated with facet joint arthritis. The patient is being followed by Dr. Nelva Bush. We may consider EMG and nerve conduction study in the future. We will check blood work to evaluate the gait instability issue. He has had a recent MRI of the  brain that does not show acute changes, he has had an old left middle cerebral artery infarct. The patient will undergo a carotid Doppler study. He will follow-up in 4 months.   Jill Alexanders MD 01/01/2017 11:44 AM  [facility] [address] [place], [place] [address]  Phone [phone] Fax [phone]

## 2017-01-01 NOTE — Patient Instructions (Signed)
   We will get a cardiac monitor and get a carotid doppler study.

## 2017-01-02 NOTE — Telephone Encounter (Signed)
Pt transferred to [facility] to schedule event monitor.

## 2017-01-02 NOTE — Telephone Encounter (Signed)
Pt states he is returning call from earlier today. Epic notes indicate Ivin Booty called him to schedule event monitor.

## 2017-01-02 NOTE — Telephone Encounter (Signed)
Billy Carpenter is returning a call .  Thanks

## 2017-01-03 ENCOUNTER — Telehealth: Payer: Self-pay | Admitting: *Deleted

## 2017-01-03 DIAGNOSIS — M5137 Other intervertebral disc degeneration, lumbosacral region: Secondary | ICD-10-CM | POA: Diagnosis not present

## 2017-01-03 LAB — VITAMIN B12: VITAMIN B 12: 385 pg/mL (ref 232–1245)

## 2017-01-03 LAB — COPPER, SERUM: Copper: 91 ug/dL (ref 72–166)

## 2017-01-03 LAB — SEDIMENTATION RATE: SED RATE: 2 mm/h (ref 0–30)

## 2017-01-03 NOTE — Telephone Encounter (Signed)
Called and LVM for patient about unremarkable labs per CW,MD note. Gave [facility] phone number if he has further questions or concerns.

## 2017-01-05 ENCOUNTER — Other Ambulatory Visit: Payer: Self-pay | Admitting: *Deleted

## 2017-01-05 DIAGNOSIS — R42 Dizziness and giddiness: Secondary | ICD-10-CM

## 2017-01-05 DIAGNOSIS — R55 Syncope and collapse: Principal | ICD-10-CM

## 2017-01-08 ENCOUNTER — Telehealth: Payer: Self-pay | Admitting: Neurology

## 2017-01-08 ENCOUNTER — Ambulatory Visit (HOSPITAL_COMMUNITY)
Admission: RE | Admit: 2017-01-08 | Discharge: 2017-01-08 | Disposition: A | Discharge status: Home / Self Care | Payer: Medicare Other | Source: Ambulatory Visit | Attending: Neurology | Admitting: Neurology

## 2017-01-08 DIAGNOSIS — R42 Dizziness and giddiness: Secondary | ICD-10-CM | POA: Diagnosis not present

## 2017-01-08 DIAGNOSIS — I6523 Occlusion and stenosis of bilateral carotid arteries: Secondary | ICD-10-CM | POA: Diagnosis not present

## 2017-01-08 DIAGNOSIS — R55 Syncope and collapse: Secondary | ICD-10-CM | POA: Diagnosis not present

## 2017-01-08 LAB — VAS US CAROTID
LCCADDIAS: 24 cm/s
LCCADSYS: 78 cm/s
LEFT ECA DIAS: -18 cm/s
LEFT VERTEBRAL DIAS: 15 cm/s
LICADDIAS: -17 cm/s
LICADSYS: -48 cm/s
LICAPDIAS: -22 cm/s
LICAPSYS: -64 cm/s
Left CCA prox dias: 24 cm/s
Left CCA prox sys: 85 cm/s
RIGHT ECA DIAS: -12 cm/s
RIGHT VERTEBRAL DIAS: -13 cm/s
Right CCA prox dias: -16 cm/s
Right CCA prox sys: -67 cm/s

## 2017-01-08 NOTE — Telephone Encounter (Signed)
I called patient. A carotid Doppler study is unremarkable, a cardiac monitor study is pending.   Carotid doppler study 01/08/17:  Summary:  - The vertebral arteries appear patent with antegrade flow. - Findings consistent with a 1- 93 percent stenosis involving the right internal carotid artery and the left internal carotid artery.

## 2017-01-08 NOTE — Progress Notes (Signed)
*  PRELIMINARY RESULTS* Vascular Ultrasound Carotid Duplex (Doppler) has been completed.  Preliminary findings: Bilateral 1-39% ICA stenosis, antegrade vertebral flow.   Everrett Coombe 01/08/2017, 9:33 AM

## 2017-01-16 ENCOUNTER — Ambulatory Visit (INDEPENDENT_AMBULATORY_CARE_PROVIDER_SITE_OTHER): Payer: Medicare Other

## 2017-01-16 DIAGNOSIS — R42 Dizziness and giddiness: Secondary | ICD-10-CM

## 2017-01-16 DIAGNOSIS — R55 Syncope and collapse: Secondary | ICD-10-CM | POA: Diagnosis not present

## 2017-01-18 DIAGNOSIS — M5137 Other intervertebral disc degeneration, lumbosacral region: Secondary | ICD-10-CM | POA: Diagnosis not present

## 2017-01-18 DIAGNOSIS — M545 Low back pain: Secondary | ICD-10-CM | POA: Diagnosis not present

## 2017-01-31 ENCOUNTER — Ambulatory Visit: Payer: Medicare Other | Admitting: Neurology

## 2017-02-13 ENCOUNTER — Telehealth: Payer: Self-pay | Admitting: Cardiology

## 2017-02-13 ENCOUNTER — Ambulatory Visit (HOSPITAL_COMMUNITY)
Admission: RE | Admit: 2017-02-13 | Discharge: 2017-02-13 | Disposition: A | Discharge status: Home / Self Care | Payer: Medicare Other | Source: Ambulatory Visit | Attending: Nurse Practitioner | Admitting: Nurse Practitioner

## 2017-02-13 ENCOUNTER — Telehealth: Payer: Self-pay | Admitting: Neurology

## 2017-02-13 ENCOUNTER — Encounter: Payer: Self-pay | Admitting: Cardiovascular Disease

## 2017-02-13 ENCOUNTER — Telehealth: Payer: Self-pay | Admitting: Cardiovascular Disease

## 2017-02-13 VITALS — BP 134/82 | HR 113 | Ht 68.0 in | Wt 157.6 lb

## 2017-02-13 DIAGNOSIS — Z7901 Long term (current) use of anticoagulants: Secondary | ICD-10-CM | POA: Insufficient documentation

## 2017-02-13 DIAGNOSIS — E785 Hyperlipidemia, unspecified: Secondary | ICD-10-CM | POA: Insufficient documentation

## 2017-02-13 DIAGNOSIS — I340 Nonrheumatic mitral (valve) insufficiency: Secondary | ICD-10-CM | POA: Insufficient documentation

## 2017-02-13 DIAGNOSIS — Z8042 Family history of malignant neoplasm of prostate: Secondary | ICD-10-CM | POA: Insufficient documentation

## 2017-02-13 DIAGNOSIS — Z8249 Family history of ischemic heart disease and other diseases of the circulatory system: Secondary | ICD-10-CM | POA: Diagnosis not present

## 2017-02-13 DIAGNOSIS — I471 Supraventricular tachycardia: Secondary | ICD-10-CM | POA: Diagnosis not present

## 2017-02-13 DIAGNOSIS — I10 Essential (primary) hypertension: Secondary | ICD-10-CM | POA: Insufficient documentation

## 2017-02-13 DIAGNOSIS — Z8673 Personal history of transient ischemic attack (TIA), and cerebral infarction without residual deficits: Secondary | ICD-10-CM | POA: Diagnosis not present

## 2017-02-13 DIAGNOSIS — Z9889 Other specified postprocedural states: Secondary | ICD-10-CM | POA: Insufficient documentation

## 2017-02-13 DIAGNOSIS — Z8261 Family history of arthritis: Secondary | ICD-10-CM | POA: Diagnosis not present

## 2017-02-13 DIAGNOSIS — Z79899 Other long term (current) drug therapy: Secondary | ICD-10-CM | POA: Insufficient documentation

## 2017-02-13 DIAGNOSIS — I4891 Unspecified atrial fibrillation: Secondary | ICD-10-CM | POA: Diagnosis not present

## 2017-02-13 DIAGNOSIS — N400 Enlarged prostate without lower urinary tract symptoms: Secondary | ICD-10-CM | POA: Diagnosis not present

## 2017-02-13 NOTE — Telephone Encounter (Signed)
Please refer to telephone note from today's date.

## 2017-02-13 NOTE — Telephone Encounter (Signed)
Pt seen in Afib clinic at 3:30 today.

## 2017-02-13 NOTE — Telephone Encounter (Signed)
RJ ([employer]) called to report first triggered abnormal EKG reading. The reading was atrial fibrillation in the 150-180 range. RJ was able to reach the patient. He said the patient reported he was sweeping his deck. He states he felt lightheaded, had some fluttering and dizziness. The patient told him he often gets these symptoms while doing work outside. RJ states the patient sat down and sent a follow-up recording. The follow-up reading was sinus rhythm between 110-120. He still reported lightheadedness but that it was improving.

## 2017-02-13 NOTE — Telephone Encounter (Signed)
Spoke with pt and wife. Pt reports feeling worse as the day goes forth.   Feels fluttering in his chest and light-headedness. Appt made to see Roderic Palau, NP in the AFib clinic today to discuss new onset AFib and possible need for anticoagulation. Directions to Afib clinic given, along with parking code. AFib clinic notified. Will fax monitor reading, from today, to Afib clinic. Patient verbalized understanding and agreeable to plan.

## 2017-02-13 NOTE — Telephone Encounter (Signed)
Pt called in he has been on heart monitor for the past 28 days. He said there have been incidents over the past 28 days he called in. Today he has been outside sweeping off the deck due to the [facility], said he has sent multiple incidents today to [employer]. He sat down a few minutes ago and they called him and advised him to contact Dr Jannifer Franklin. After sitting down for the past 25-30 minutes he is still experiencing lightheadedness, palpations and fluttering. Patient was advised he may need to go to ED. Patient was advised the message will be sent to provider immediately and he will return the call

## 2017-02-13 NOTE — Telephone Encounter (Signed)
I called patient. The patient had onset of palpitations of the heart, increased heart rate while sleeping is back porch today. Typically these events will last only about 15 minutes and go away, this has lasted longer than that. The patient denies any shortness of breath, syncope, or chest pain. He does have some chest tightness.  He is hooked up to a 30 day monitor. I have recommended that he call his primary doctor to see if they 12-lead EKG can be done now. If the symptoms persist for more than 2 or 3 hours today, the patient may need to go to the emergency room.  He has been seen through cardiology previously.

## 2017-02-13 NOTE — Telephone Encounter (Signed)
New message       Calling with an abnormal ekg reading

## 2017-02-13 NOTE — Telephone Encounter (Signed)
New Message     [employer] called her husband and suggested he come in the office for a stat EKG  Due to events that are showing on the heart monitor he is wearing please call asap

## 2017-02-13 NOTE — Telephone Encounter (Signed)
Noted  

## 2017-02-13 NOTE — Telephone Encounter (Signed)
New message      Calling to give an abn ekg reading.  Pt is wearing a monitor

## 2017-02-13 NOTE — Telephone Encounter (Signed)
Collene Mares from [employer] called to report that pt is having rapid A-Fib rate 200 beats/minute and rate of 170 to 190 beats/minute plus  a wide complex 3 beats. Pt was called by Collene Mares at  [employer] [facility] she states that pt   states that he is not feeling well he feels that his heart was having  rapid palpitations, and light headed.

## 2017-02-13 NOTE — Telephone Encounter (Signed)
Call Documentation   Howie Ill at 02/13/2017 1:11 PM   Status: Signed    New Message     [employer] called her husband and suggested he come in the office for a stat EKG  Due to events that are showing on the heart monitor he is wearing please call asap

## 2017-02-13 NOTE — Telephone Encounter (Signed)
This encounter was created in error - please disregard.

## 2017-02-14 ENCOUNTER — Telehealth: Payer: Self-pay | Admitting: Cardiology

## 2017-02-14 ENCOUNTER — Encounter (HOSPITAL_COMMUNITY): Payer: Self-pay | Admitting: Nurse Practitioner

## 2017-02-14 ENCOUNTER — Ambulatory Visit (HOSPITAL_COMMUNITY)
Admission: RE | Admit: 2017-02-14 | Discharge: 2017-02-14 | Disposition: A | Discharge status: Home / Self Care | Payer: Medicare Other | Source: Ambulatory Visit | Attending: Nurse Practitioner | Admitting: Nurse Practitioner

## 2017-02-14 VITALS — BP 116/74 | HR 63

## 2017-02-14 DIAGNOSIS — I4819 Other persistent atrial fibrillation: Secondary | ICD-10-CM

## 2017-02-14 MED ORDER — RIVAROXABAN 20 MG PO TABS: 20.0000 mg | ORAL_TABLET | Freq: Every day | ORAL | 3 refills | Status: DC

## 2017-02-14 MED ORDER — RIVAROXABAN 20 MG PO TABS: 20.0000 mg | ORAL_TABLET | Freq: Every day | ORAL | 0 refills | Status: DC

## 2017-02-14 MED ORDER — DILTIAZEM HCL ER COATED BEADS 120 MG PO CP24: 120.0000 mg | ORAL_CAPSULE | Freq: Every day | ORAL | 3 refills | Status: DC

## 2017-02-14 NOTE — Progress Notes (Signed)
Primary Care Physician: Alroy Dust, L.Marlou Sa, MD Referring Physician: [facility] triage   Billy Carpenter is a 71 y.o. male with a h/o left internal carotid spontaneous dissection, CVA 2013, HTN, that is wearing a 30 day event monitor for feelings of lightheadedness and palpitations with exertional activity, first noted in February but has worsened over the last few months. I was asked to see pt for tachycardia with high v rates that appears to be atrial flutter. I do not have the full monitor results but,the strips I do have for the last couple of days show very frequent PVC's, with which he is also very symptomatic.Marland Kitchen He states that he has not been able to enjoy golf for months due to the palpitations/lightededness. Last couple of days, he has had the most symptomatic episodes, which correlate to atrial flutter with high v rates, non sustained. No syncope with this but got very lightheaded.  Today, he denies symptoms of palpitations, chest pain, shortness of breath, orthopnea, PND, lower extremity edema, dizziness, presyncope, syncope, or neurologic sequela. The patient is tolerating medications without difficulties and is otherwise without complaint today.   Past Medical History:  Diagnosis Date  . BPH (benign prostatic hyperplasia)   . Carotid artery disease ([place])    Left internal carotid artery spontaneous dissection in the past  //   Doppler, October, 2013, minimal plaque, 0-39% bilateral  . Chest discomfort    nuclear, March, 2010, no ischemia ( question of decreased ejection fraction but followup echo showed ejection fraction to be 55%)  . CVA (cerebral infarction)    left insular cortex stroke with left internal carotid artery dissection and near occlusion  . Dizziness    some vertigo and some positional  . Dyslipidemia   . Ejection fraction    . EF 55%, echo, March, 2010  . Hematuria   . Hypertension   . Mitral regurgitation    mild, echo, March, 2010  . Supraventricular tachycardia [facility])     Past Surgical History:  Procedure Laterality Date  . BLADDER STONE REMOVAL    . PROSTATE SURGERY  2009  . WRIST SURGERY     Right    Current Outpatient Prescriptions  Medication Sig Dispense Refill  . atorvastatin (LIPITOR) 20 MG tablet Take 1 tablet (20 mg total) by mouth daily. 90 tablet 3  . metoprolol succinate (TOPROL-XL) 50 MG 24 hr tablet Take 50 mg by mouth daily.  5  . diltiazem (CARDIZEM CD) 120 MG 24 hr capsule Take 1 capsule (120 mg total) by mouth at bedtime. 30 capsule 3  . rivaroxaban (XARELTO) 20 MG TABS tablet Take 1 tablet (20 mg total) by mouth daily with supper. 30 tablet 0   No current facility-administered medications for this encounter.     No Known Allergies  Social History   Social History  . Marital status: Married    Spouse name: Happy  . Number of children: 3  . Years of education: MBA   Occupational History  . Not on file.   Social History Main Topics  . Smoking status: Never Smoker  . Smokeless tobacco: Never Used  . Alcohol use Yes  . Drug use: No  . Sexual activity: Not on file   Other Topics Concern  . Not on file   Social History Narrative   Lives with wife   Right handed   Caffeine use: 3 cups per day    Family History  Problem Relation Age of Onset  . Arrhythmia Mother   .  Hypertension Mother   . Arthritis Mother   . Other Father        tumor  . Prostate cancer Father   . CVA Neg Hx     ROS- All systems are reviewed and negative except as per the HPI above  Physical Exam: Vitals:   02/13/17 1542  BP: 134/82  Pulse: (!) 113  Weight: 157 lb 9.6 oz (71.5 kg)  Height: 5\' 8"  (1.727 m)   Wt Readings from Last 3 Encounters:  02/13/17 157 lb 9.6 oz (71.5 kg)  01/01/17 155 lb 8 oz (70.5 kg)  04/12/16 155 lb 9.6 oz (70.6 kg)    Labs: Lab Results  Component Value Date   NA 138 11/12/2016   K 4.0 11/12/2016   CL 106 11/12/2016   CO2 26 11/12/2016   GLUCOSE 105 (H) 11/12/2016   BUN 13 11/12/2016   CREATININE  0.94 11/12/2016   CALCIUM 8.9 11/12/2016   No results found for: INR No results found for: CHOL, HDL, LDLCALC, TRIG   GEN- The patient is well appearing, alert and oriented x 3 today.   Head- normocephalic, atraumatic Eyes-  Sclera clear, conjunctiva pink Ears- hearing intact Oropharynx- clear Neck- supple, no JVP Lymph- no cervical lymphadenopathy Lungs- Clear to ausculation bilaterally, normal work of breathing Heart- Regular rate and rhythm, no murmurs, rubs or gallops, PMI not laterally displaced GI- soft, NT, ND, + BS Extremities- no clubbing, cyanosis, or edema MS- no significant deformity or atrophy Skin- no rash or lesion Psych- euthymic mood, full affect Neuro- strength and sensation are intact  EKG-Sinus tach at 113 bpm, pr int 152 ms, qrs int 118 bpm, qtc 408 ms Epic records reviewed  Echo- 2013-Study Conclusions  - Left ventricle: The cavity size was normal. There was mild focal basal hypertrophy of the septum. Systolic function was normal. The estimated ejection fraction was in the range of 60% to 65%. Wall motion was normal; there were no regional wall motion abnormalities. Doppler parameters are consistent with abnormal left ventricular relaxation (grade 1 diastolic dysfunction). - Aortic valve: There was trivial regurgitation. - Aorta: Aortic root dimension: 42 mm (ED). - Ascending aorta: The ascending aorta was mildly dilated. - Left atrium: The atrium was mildly dilated.  Caroid U/S -2015-Heterogeneous plaque, bilaterally. Stable 1-39% bilateral ICA stenosis. Patent vertebral arteries with antegrade flow. Normal subclavian arteries, bilaterally. f/u 2 years given stability  Assessment and Plan: 1. Tachycardia, possible atrial flutter, symptomatic Do not have full monitor, pt turns in tomorrow What I have on hand last 2 days, looks to be atrial flutter Strips also show very frequent PVC's, short runs, bigeminal rhythm  General education re  afib/flutter discussed Continue current rate control, metoprolol succinate 50 mg qd  Add Cardizem 120 mg qd  Recommend not to drive or be overly active until arrhythmia's can be further evaluated He has been drinking large amounts of caffeine and daily alcohol and will cut back on both Update echo Sleep study pending for snoring/apnea per wife  2. Chadsvasc score of 4 He denies bleeding risk Stop asa and start xarelto 20 mg qd, renal function calculated at 71.68 Bleeding precautions discussed   He has been made an appointment with Dr. Curt Bears for Monday at 3 pm. afib clinic as needed  Butch Penny C. Kaylee Wombles, [facility] [address] [place], [place] [address] [phone]

## 2017-02-14 NOTE — Telephone Encounter (Signed)
Spoke with Dr. Curt Bears and his RN and the patient is suppose to be seen in the Afib Clinic today and they have already been in contact with the patient. The patient is not on any anticoagulation at this time. Will have medical records scan strips to patient's chart. Will forward to Roderic Palau, NP and her RN for review.

## 2017-02-14 NOTE — Telephone Encounter (Signed)
Marzetta Board, RN with Afib clinic spoke with patient this morning.  Patient was symptomatic this morning, but reports that he is fine at the moment.  Patient is being seen in the AFib clinic this afternoon to discuss treatment plan and possible referral to EP.

## 2017-02-14 NOTE — Telephone Encounter (Signed)
New message    [employer] is calling to report abnormal EKG

## 2017-02-14 NOTE — Patient Instructions (Signed)
Your physician has recommended you make the following change in your medication:  1)Stop aspirin 2)Start Xarelto 20mg  once a day with supper 3)Start Diltiazem (Cardizem) 120mg  one a day at bedtime  Let us know if your blood pressure on top is reading less than 100

## 2017-02-14 NOTE — Telephone Encounter (Signed)
Attempted to call the patient, but there was no answer. Left message for patient to call back.

## 2017-02-14 NOTE — Telephone Encounter (Signed)
[employer] calling to report that the patient had a 30 second episode of Afib RVR in the 200s. She states that he felt lightheaded, dizzy, and can feel palpitations, and that he is sitting. She states that the patient is still in Afib with a rate of 110-150. She states that she spoke to the patient and advised him to be seen in the ER. She states that the patient did not want to go to the ER, but wanted to be seen in the office. [employer] calling to make [place] aware and are going to upload the strips to the website.

## 2017-02-15 ENCOUNTER — Ambulatory Visit (HOSPITAL_COMMUNITY)
Admission: RE | Admit: 2017-02-15 | Discharge: 2017-02-15 | Disposition: A | Discharge status: Home / Self Care | Payer: Medicare Other | Source: Ambulatory Visit | Attending: Nurse Practitioner | Admitting: Nurse Practitioner

## 2017-02-15 DIAGNOSIS — I083 Combined rheumatic disorders of mitral, aortic and tricuspid valves: Secondary | ICD-10-CM | POA: Insufficient documentation

## 2017-02-15 DIAGNOSIS — I481 Persistent atrial fibrillation: Secondary | ICD-10-CM | POA: Insufficient documentation

## 2017-02-15 DIAGNOSIS — I4819 Other persistent atrial fibrillation: Secondary | ICD-10-CM

## 2017-02-15 NOTE — Progress Notes (Signed)
  Echocardiogram 2D Echocardiogram has been performed.  Anthonee Gelin T Dalilah Curlin 02/15/2017, 10:54 AM

## 2017-02-16 ENCOUNTER — Telehealth: Payer: Self-pay | Admitting: *Deleted

## 2017-02-16 DIAGNOSIS — I4891 Unspecified atrial fibrillation: Secondary | ICD-10-CM

## 2017-02-16 NOTE — Telephone Encounter (Signed)
-----   Message from Juluis Mire, RN sent at 02/14/2017  4:38 PM EDT ----- Regarding: sleep study Pt needs sleep study for afib.  Thanks!! Marzetta Board RN Afib CLinic

## 2017-02-18 ENCOUNTER — Telehealth: Payer: Self-pay | Admitting: Neurology

## 2017-02-18 NOTE — Telephone Encounter (Signed)
The cardiac monitor study shows Afib, the patient is aware and has seen cardiology, he has been taken off of aspirin and placed on Xarelto.   Cardiac monitor study 02/15/17:  Sinus rhythm, SVT, PVCs, atrial fibrillation seen. No evidence of bradyarrhythmia

## 2017-02-19 ENCOUNTER — Encounter: Payer: Self-pay | Admitting: Cardiology

## 2017-02-19 ENCOUNTER — Ambulatory Visit (INDEPENDENT_AMBULATORY_CARE_PROVIDER_SITE_OTHER): Payer: Medicare Other | Admitting: Cardiology

## 2017-02-19 VITALS — BP 124/92 | HR 70 | Ht 68.0 in | Wt 159.0 lb

## 2017-02-19 DIAGNOSIS — I493 Ventricular premature depolarization: Secondary | ICD-10-CM | POA: Diagnosis not present

## 2017-02-19 DIAGNOSIS — I428 Other cardiomyopathies: Secondary | ICD-10-CM

## 2017-02-19 DIAGNOSIS — I471 Supraventricular tachycardia: Secondary | ICD-10-CM

## 2017-02-19 MED ORDER — METOPROLOL SUCCINATE ER 50 MG PO TB24: 50.0000 mg | ORAL_TABLET | Freq: Every day | ORAL | 1 refills | Status: DC

## 2017-02-19 MED ORDER — LOSARTAN POTASSIUM 25 MG PO TABS: 25.0000 mg | ORAL_TABLET | Freq: Every day | ORAL | 1 refills | Status: DC

## 2017-02-19 NOTE — Progress Notes (Signed)
Electrophysiology Office Note   Date:  02/19/2017   ID:  Billy Carpenter, DOB 1945-11-10, MRN [id number]  PCP:  Aurea Graff.Marlou Sa, MD  Cardiologist:   Primary Electrophysiologist:  Pat Sires Meredith Leeds, MD    Chief Complaint  Patient presents with  . Follow-up    AFib/PVC's on monitor     History of Present Illness: Billy Carpenter is a 71 y.o. male who is being seen today for the evaluation of palpitations/lightheadedness at the request of Alroy Dust, L.Marlou Sa, MD. Presenting today for electrophysiology evaluation. He is been having palpitations and lightheadedness. This mainly occurs with exertion. He wore cardiac monitor that showed multiple PVCs, SVT, possibly atrial fibrillation. He was put on Xarelto. Is also put on diltiazem. He is felt much improved since starting these medications. That being said, he has been having difficulty doing his daily activities such as playing golf and working in the yard. He does have spinal stenosis and this potentially limits his activity as well. He has not had syncope but has gotten very lightheaded.    Today, he denies symptoms of chest pain, shortness of breath, orthopnea, PND, lower extremity edema, claudication, presyncope, syncope, bleeding, or neurologic sequela. The patient is tolerating medications without difficulties.    Past Medical History:  Diagnosis Date  . BPH (benign prostatic hyperplasia)   . Carotid artery disease ([place])    Left internal carotid artery spontaneous dissection in the past  //   Doppler, October, 2013, minimal plaque, 0-39% bilateral  . Chest discomfort    nuclear, March, 2010, no ischemia ( question of decreased ejection fraction but followup echo showed ejection fraction to be 55%)  . CVA (cerebral infarction)    left insular cortex stroke with left internal carotid artery dissection and near occlusion  . Dizziness    some vertigo and some positional  . Dyslipidemia   . Ejection fraction    . EF 55%, echo,  March, 2010  . Hematuria   . Hypertension   . Mitral regurgitation    mild, echo, March, 2010  . Supraventricular tachycardia [facility])    Past Surgical History:  Procedure Laterality Date  . BLADDER STONE REMOVAL    . PROSTATE SURGERY  2009  . WRIST SURGERY     Right     Current Outpatient Prescriptions  Medication Sig Dispense Refill  . atorvastatin (LIPITOR) 20 MG tablet Take 1 tablet (20 mg total) by mouth daily. 90 tablet 3  . rivaroxaban (XARELTO) 20 MG TABS tablet Take 1 tablet (20 mg total) by mouth daily with supper. 30 tablet 3  . losartan (COZAAR) 25 MG tablet Take 1 tablet (25 mg total) by mouth daily. 90 tablet 1  . metoprolol succinate (TOPROL-XL) 50 MG 24 hr tablet Take 1 tablet (50 mg total) by mouth daily. Take with or immediately following a meal. 90 tablet 1   No current facility-administered medications for this visit.     Allergies:   Patient has no known allergies.   Social History:  The patient  reports that he has never smoked. He has never used smokeless tobacco. He reports that he drinks alcohol. He reports that he does not use drugs.   Family History:  The patient's family history includes Arrhythmia in his mother; Arthritis in his mother; Hypertension in his mother; Other in his father; Prostate cancer in his father.    ROS:  Please see the history of present illness.   Otherwise, review of systems is positive for Chest pressure,  palpitations, dyspnea on exertion, back pain, dizziness.   All other systems are reviewed and negative.    PHYSICAL EXAM: VS:  BP (!) 124/92   Pulse 70   Ht 5\' 8"  (1.727 m)   Wt 159 lb (72.1 kg)   SpO2 95%   BMI 24.18 kg/m  , BMI Body mass index is 24.18 kg/m. GEN: Well nourished, well developed, in no acute distress  HEENT: normal  Neck: no JVD, carotid bruits, or masses Cardiac: iRRR; no murmurs, rubs, or gallops,no edema  Respiratory:  clear to auscultation bilaterally, normal work of breathing GI: soft, nontender,  nondistended, + BS MS: no deformity or atrophy  Skin: warm and dry Neuro:  Strength and sensation are intact Psych: euthymic mood, full affect  EKG:  EKG is not ordered today. Personal review of the ekg ordered 02/13/17 shows SR, PVCs, APCs  Recent Labs: 11/12/2016: ALT 19; BUN 13; Creatinine, Ser 0.94; Hemoglobin 15.0; Platelets 161; Potassium 4.0; Sodium 138    Lipid Panel  No results found for: CHOL, TRIG, HDL, CHOLHDL, VLDL, LDLCALC, LDLDIRECT   Wt Readings from Last 3 Encounters:  02/19/17 159 lb (72.1 kg)  02/13/17 157 lb 9.6 oz (71.5 kg)  01/01/17 155 lb 8 oz (70.5 kg)      Other studies Reviewed: Additional studies/ records that were reviewed today include: TTE 02/15/17  Review of the above records today demonstrates:  - Left ventricle: The cavity size was normal. There was mild focal   basal hypertrophy of the septum. Systolic function was moderately   to severely reduced. The estimated ejection fraction was in the   range of 30% to 35%. Diffuse hypokinesis. Doppler parameters are   consistent with abnormal left ventricular relaxation (grade 1   diastolic dysfunction). There was no evidence of elevated   ventricular filling pressure by Doppler parameters. - Aortic valve: There was mild regurgitation. - Mitral valve: There was mild regurgitation. - Left atrium: The atrium was mildly dilated. - Right ventricle: The cavity size was mildly dilated. Wall   thickness was normal. Systolic function was mildly reduced. - Right atrium: The atrium was moderately dilated. - Tricuspid valve: There was mild regurgitation. - Pulmonic valve: There was mild regurgitation. - Pulmonary arteries: Systolic pressure was within the normal   range. - Inferior vena cava: The vessel was normal in size.     ASSESSMENT AND PLAN:  1.  PVCs: Seen on monitor and apparently has symptoms. He is having a high volume on his monitor, though is quite difficult to tell the exact number. We'll  therefore put him on a 48 hour monitor for improved PVC burden burden is recovering drop in EF, we'll plan for potential ablation versus amiodarone.  2. Paroxysmal atrial fibrillation: Recently put on Xarelto. 30 day monitor showed episodes of atrial fibrillation. Rate controlled. May require antiarrhythmics.  This patients CHA2DS2-VASc Score and unadjusted Ischemic Stroke Rate (% per year) is equal to 4.8 % stroke rate/year from a score of 4  Above score calculated as 1 point each if present [CHF, HTN, DM, Vascular=MI/PAD/Aortic Plaque, Age if 65-74, or Male] Above score calculated as 2 points each if present [Age > 75, or Stroke/TIA/TE]    3. SVT: Seen on a cardiac monitor. Plan to increase metoprolol. Possibility for ablation in the future. Risks and benefits discussed. Risks include bleeding, tamponade, heart block, and stroke. The patient understands these risks.  4. Systolic heart failure, duration unknown: Ejection fraction decreased, newly found at 30-35%. Full therefore  stop his diltiazem and increase his Toprol-XL to 100 mg. We'll also start him on losartan.  Current medicines are reviewed at length with the patient today.   The patient does not have concerns regarding his medicines.  The following changes were made today:  Stop diltiazem, increase Toprol-XL, start losartan  Labs/ tests ordered today include:  Orders Placed This Encounter  Procedures  . Holter monitor - 48 hour     Disposition:   FU with Dearies Meikle 3 months  Signed, Tasheema Perrone Meredith Leeds, MD  02/19/2017 4:10 PM     [place] [address] [place]  [id number] [phone] (office) [phone] (fax)

## 2017-02-19 NOTE — Patient Instructions (Addendum)
Medication Instructions:  Your physician has recommended you make the following change in your medication:  1. STOP Diltiazem 2. INCREASE  Toprol to 50 mg daily 3. START Losartan 25 mg daily  -- If you need a refill on your cardiac medications before your next appointment, please call your pharmacy. --  Labwork: None ordered  Testing/Procedures: Your physician has recommended that you wear a 48 hour holter monitor. Holter monitors are medical devices that record the heart's electrical activity. Doctors most often use these monitors to diagnose arrhythmias. Arrhythmias are problems with the speed or rhythm of the heartbeat. The monitor is a small, portable device. You can wear one while you do your normal daily activities. This is usually used to diagnose what is causing palpitations/syncope (passing out).  Follow-Up: Your physician recommends that you schedule a follow-up appointment in: 3 months with Dr. Curt Bears.  Thank you for choosing [facility]!!   Billy Curet, RN [phone]  Any Other Special Instructions Will Be Listed Below (If Applicable).  Losartan tablets What is this medicine? LOSARTAN (loe SAR tan) is used to treat high blood pressure and to reduce the risk of stroke in certain patients. This drug also slows the progression of kidney disease in patients with diabetes. This medicine may be used for other purposes; ask your health care provider or pharmacist if you have questions. COMMON BRAND NAME(S): Cozaar What should I tell my health care provider before I take this medicine? They need to know if you have any of these conditions: -heart failure -kidney or liver disease -an unusual or allergic reaction to losartan, other medicines, foods, dyes, or preservatives -pregnant or trying to get pregnant -breast-feeding How should I use this medicine? Take this medicine by mouth with a glass of water. Follow the directions on the prescription label. This medicine  can be taken with or without food. Take your doses at regular intervals. Do not take your medicine more often than directed. Talk to your pediatrician regarding the use of this medicine in children. Special care may be needed. Overdosage: If you think you have taken too much of this medicine contact a poison control center or emergency room at once. NOTE: This medicine is only for you. Do not share this medicine with others. What if I miss a dose? If you miss a dose, take it as soon as you can. If it is almost time for your next dose, take only that dose. Do not take double or extra doses. What may interact with this medicine? -blood pressure medicines -diuretics, especially triamterene, spironolactone, or amiloride -fluconazole -NSAIDs, medicines for pain and inflammation, like ibuprofen or naproxen -potassium salts or potassium supplements -rifampin This list may not describe all possible interactions. Give your health care provider a list of all the medicines, herbs, non-prescription drugs, or dietary supplements you use. Also tell them if you smoke, drink alcohol, or use illegal drugs. Some items may interact with your medicine. What should I watch for while using this medicine? Visit your doctor or health care professional for regular checks on your progress. Check your blood pressure as directed. Ask your doctor or health care professional what your blood pressure should be and when you should contact him or her. Call your doctor or health care professional if you notice an irregular or fast heart beat. Women should inform their doctor if they wish to become pregnant or think they might be pregnant. There is a potential for serious side effects to an unborn child, particularly  in the second or third trimester. Talk to your health care professional or pharmacist for more information. You may get drowsy or dizzy. Do not drive, use machinery, or do anything that needs mental alertness until you  know how this drug affects you. Do not stand or sit up quickly, especially if you are an older patient. This reduces the risk of dizzy or fainting spells. Alcohol can make you more drowsy and dizzy. Avoid alcoholic drinks. Avoid salt substitutes unless you are told otherwise by your doctor or health care professional. Do not treat yourself for coughs, colds, or pain while you are taking this medicine without asking your doctor or health care professional for advice. Some ingredients may increase your blood pressure. What side effects may I notice from receiving this medicine? Side effects that you should report to your doctor or health care professional as soon as possible: -confusion, dizziness, light headedness or fainting spells -decreased amount of urine passed -difficulty breathing or swallowing, hoarseness, or tightening of the throat -fast or irregular heart beat, palpitations, or chest pain -skin rash, itching -swelling of your face, lips, tongue, hands, or feet Side effects that usually do not require medical attention (report to your doctor or health care professional if they continue or are bothersome): -cough -decreased sexual function or desire -headache -nasal congestion or stuffiness -nausea or stomach pain -sore or cramping muscles This list may not describe all possible side effects. Call your doctor for medical advice about side effects. You may report side effects to FDA at 1-800-FDA-1088. Where should I keep my medicine? Keep out of the reach of children. Store at room temperature between 15 and 30 degrees C (59 and 86 degrees F). Protect from light. Keep container tightly closed. Throw away any unused medicine after the expiration date. NOTE: This sheet is a summary. It may not cover all possible information. If you have questions about this medicine, talk to your doctor, pharmacist, or health care provider.  2018 Elsevier/Gold Standard (2007-07-26 16:42:18)

## 2017-02-20 NOTE — Telephone Encounter (Signed)
Informed patient of upcoming sleep study and patient understanding was verbalized. Patient understands his sleep study is scheduled for Friday April 06 2017. Patient understands his sleep study will be done at [facility] sleep lab. Patient understands he will receive a sleep packet in a week or so. Patient understands to call if he does not receive the sleep packet in a timely manner. Patient agrees with treatment and thanked me for call.

## 2017-02-21 ENCOUNTER — Telehealth: Payer: Self-pay | Admitting: Cardiology

## 2017-02-21 NOTE — Telephone Encounter (Signed)
New message   Pt wants rn or Dr.Camnitz to contact the medicare provider line   for approval for his Holter monitor [phone]

## 2017-03-01 NOTE — Telephone Encounter (Signed)
Updated/notified pt we were still working on this. He understands we will follow up next week

## 2017-03-08 NOTE — Telephone Encounter (Signed)
Informed pt there should be no reason insurance needs to speak to physician for Holter monitor. Informed that I spoke w/ our monitor tech who agrees that insurance should not need to speak to a physician for a Holter monitor. Pt understands office will call him to re-scheduled monitor.

## 2017-03-08 NOTE — Telephone Encounter (Signed)
New Message ° ° pt verbalized that he is returning call for rn  °

## 2017-03-15 ENCOUNTER — Telehealth: Payer: Self-pay | Admitting: Cardiology

## 2017-03-15 ENCOUNTER — Encounter: Payer: Self-pay | Admitting: *Deleted

## 2017-03-15 NOTE — Telephone Encounter (Signed)
New message     Patient wife calling , states patient is increasingly "off balance".  Wants to know if patient should be waiting until next week for holter monitor placement. Advised wife, no earlier appt available. Patient wife request call back from nurse.

## 2017-03-15 NOTE — Telephone Encounter (Signed)
Wife asking about patient lifestyle and if he should be doing certain things and/or avoiding certain things - ie: diet and exercise. Advised he should be on a heart healthy diet.  He should be exercising but if he begins to experience abnormal heart rhythm he should stop what he is doing and allow his heart to rest and see if arrhythmia resolves w/ rest. States NP told pt not drink more than 2 drinks/week and Dr. Curt Bears told him 1/day -- which is correct? Will review with Dr. Curt Bears and call her by next week and let her know recommendation.  (he is out of the office this week)  (she does not want her husband to know she asked about the drinking question) She is agreeable to plan.

## 2017-03-16 ENCOUNTER — Ambulatory Visit (INDEPENDENT_AMBULATORY_CARE_PROVIDER_SITE_OTHER): Payer: Medicare Other

## 2017-03-16 DIAGNOSIS — I493 Ventricular premature depolarization: Secondary | ICD-10-CM | POA: Diagnosis not present

## 2017-03-18 DIAGNOSIS — Z23 Encounter for immunization: Secondary | ICD-10-CM | POA: Diagnosis not present

## 2017-03-19 NOTE — Telephone Encounter (Signed)
Informed wife that below instructions are correct/agreeable by Dr. Curt Bears. Informed it is ok for pt to have 1 drink/day if he wishes. Wife reports pt's "off balance" has improved over the weekend and she isn't sure what it was, but "it is better now". She also inquires about pt doing some cardiac rehab to get help w/ heart health diet and exercise.  She understands I will look into seeing if pt would qualify and let her know.  She understands it may be a week/two before getting back with her.

## 2017-03-28 ENCOUNTER — Telehealth: Payer: Self-pay | Admitting: Cardiology

## 2017-03-28 NOTE — Telephone Encounter (Signed)
Reviewed monitor results with patient.

## 2017-03-28 NOTE — Telephone Encounter (Signed)
New Message     Pt calling for holter monitor results

## 2017-04-04 ENCOUNTER — Telehealth: Payer: Self-pay

## 2017-04-04 ENCOUNTER — Telehealth: Payer: Self-pay | Admitting: *Deleted

## 2017-04-04 MED ORDER — METOPROLOL SUCCINATE ER 100 MG PO TB24: 100.0000 mg | ORAL_TABLET | Freq: Every day | ORAL | 3 refills | Status: DC

## 2017-04-04 NOTE — Telephone Encounter (Signed)
-----   Message from Erwin, LPN sent at 75/11/3223  4:31 PM EST ----- Regarding: Toprol The pts wife, Happy, called the office upset that the pts Toprol has not been called into his pharmacy. She states that the pharmacy has given them pills to hold him over until we send in refill but that he is now out of those. She is requesting a call back once the refill has been sent in as they are leaving town tonight and need his medication. Please call her. Thanks, [employer]

## 2017-04-04 NOTE — Telephone Encounter (Signed)
**Note De-Identified Aloise Copus Obfuscation** I received a call from the pts wife, Happy, concerning the pts Toprol refill. She states that Dr Curt Bears increased the dose to 100 mg at the pts last f/u on 02/19/17 but that the RX has never been sent in. She states that they have been trying to get this refill from [place] for a week without success and that their pharmacy has given them Toprol tablets to get him through until we send in refill and that he is now out of them. Happy is upset because she states they are going out of town and the pt needs his Toprol.  I checked the pts last OV notes and Dr Curt Bears noted that he was increasing dose to 100 mg but the pt instructions states 50 mg. I cannot refill as I am unsure of dose.  Judeen Hammans was contacted at the [place] office and made aware of this and I sent her a high priority message asking her to call Happy to discuss.

## 2017-04-04 NOTE — Telephone Encounter (Signed)
Informed wife that I would send rx tonight. Sent to [facility] per request. Wife thanks me for calling and following up.  (apologized to wife for the late call and explained I was in a meeting since 4pm)

## 2017-04-06 ENCOUNTER — Encounter (HOSPITAL_BASED_OUTPATIENT_CLINIC_OR_DEPARTMENT_OTHER): Payer: Medicare Other

## 2017-04-16 ENCOUNTER — Telehealth: Payer: Self-pay | Admitting: Cardiology

## 2017-04-16 NOTE — Telephone Encounter (Signed)
Mrs. Billy Carpenter is calling because [name] is having some increased Fluttering . Wants to know if he may need to have some medication adjustment . He was doing good in the beginning when he started the new medication. Please call  Thanks

## 2017-04-16 NOTE — Telephone Encounter (Signed)
Wife reports more frequent breakthrough fluttering in his chest. He notices it when he is playing golf.  "It affects his balance which is un-nerving".  Wife is going to take BP this evening and in the morning and I will call her to see what his BP is running so that Dr. Curt Bears can make recommendation/s. Wife verbalized understanding and agreeable to plan.

## 2017-04-17 DIAGNOSIS — M48062 Spinal stenosis, lumbar region with neurogenic claudication: Secondary | ICD-10-CM | POA: Diagnosis not present

## 2017-04-17 DIAGNOSIS — M545 Low back pain: Secondary | ICD-10-CM | POA: Diagnosis not present

## 2017-04-17 DIAGNOSIS — M5136 Other intervertebral disc degeneration, lumbar region: Secondary | ICD-10-CM | POA: Diagnosis not present

## 2017-04-17 NOTE — Telephone Encounter (Signed)
F/U call:  Happy returning call

## 2017-04-17 NOTE — Telephone Encounter (Signed)
Pt would like 12/11 appt. They will call back if this day will not work after wife able to review his calendar. She is agreeable to current plan.

## 2017-04-17 NOTE — Telephone Encounter (Signed)
Advised that we needed to bring pt in to office to discuss further w/ Dr. Curt Bears. Wife is agreeable. She understands I will call her back today/tomorrow to schedule a sooner OV w/ Camnitz.  Husband is currently not at home to schedule. She thanks me for helping.   She reports BP yesterday/today:    6 pm  104/73    1030 pm  98/62 This morning: 114/79, 120/92

## 2017-05-03 ENCOUNTER — Ambulatory Visit (INDEPENDENT_AMBULATORY_CARE_PROVIDER_SITE_OTHER): Payer: Medicare Other | Admitting: Neurology

## 2017-05-03 ENCOUNTER — Encounter: Payer: Self-pay | Admitting: Neurology

## 2017-05-03 DIAGNOSIS — G8929 Other chronic pain: Secondary | ICD-10-CM | POA: Diagnosis not present

## 2017-05-03 DIAGNOSIS — M545 Low back pain, unspecified: Secondary | ICD-10-CM

## 2017-05-03 HISTORY — DX: Other chronic pain: G89.29

## 2017-05-03 HISTORY — DX: Low back pain, unspecified: M54.50

## 2017-05-03 NOTE — Progress Notes (Signed)
Reason for visit: Low back pain  Billy Carpenter is an 71 y.o. male  History of present illness:  Billy Carpenter is a 71 year old right-handed white male with a history of chronic low back pain mainly on the right side with referral of pain down to the knee on the right.  The patient has no pain with sitting or lying down, when he first gets up in the morning he is very stiff and the pain is more severe.  If he stretches out and moves about the pain improves.  If he stands for more than 15 minutes, the pain comes on and becomes more severe.  The patient is able to play golf, if he is active he feels better.  The patient denies any weakness of the legs, he may occasionally have some tingling sensations down the right thigh to the knee.  The patient is followed by Dr. Nelva Bush for this, injections in the back so far have not been helpful.  The patient has recently been found to have atrial flutter, he is followed through cardiology, they are considering an atrial ablation procedure.  The patient does not report any recent episodes of near syncope.  Past Medical History:  Diagnosis Date  . BPH (benign prostatic hyperplasia)   . Carotid artery disease ([place])    Left internal carotid artery spontaneous dissection in the past  //   Doppler, October, 2013, minimal plaque, 0-39% bilateral  . Chest discomfort    nuclear, March, 2010, no ischemia ( question of decreased ejection fraction but followup echo showed ejection fraction to be 55%)  . CVA (cerebral infarction)    left insular cortex stroke with left internal carotid artery dissection and near occlusion  . Dizziness    some vertigo and some positional  . Dyslipidemia   . Ejection fraction    . EF 55%, echo, March, 2010  . Hematuria   . Hypertension   . Mitral regurgitation    mild, echo, March, 2010  . Supraventricular tachycardia [facility])     Past Surgical History:  Procedure Laterality Date  . BLADDER STONE REMOVAL    . PROSTATE SURGERY   2009  . WRIST SURGERY     Right    Family History  Problem Relation Age of Onset  . Arrhythmia Mother   . Hypertension Mother   . Arthritis Mother   . Other Father        tumor  . Prostate cancer Father   . CVA Neg Hx     Social history:  reports that  has never smoked. he has never used smokeless tobacco. He reports that he drinks alcohol. He reports that he does not use drugs.   No Known Allergies  Medications:  Prior to Admission medications   Medication Sig Start Date End Date Taking? Authorizing Provider  atorvastatin (LIPITOR) 20 MG tablet Take 1 tablet (20 mg total) by mouth daily. 06/20/16  Yes Nahser, Wonda Cheng, MD  losartan (COZAAR) 25 MG tablet Take 1 tablet (25 mg total) by mouth daily. 02/19/17 05/20/17 Yes Camnitz, Will Hassell Done, MD  metoprolol succinate (TOPROL-XL) 100 MG 24 hr tablet Take 1 tablet (100 mg total) daily by mouth. Take with or immediately following a meal. 04/04/17  Yes Camnitz, Ocie Doyne, MD  rivaroxaban (XARELTO) 20 MG TABS tablet Take 1 tablet (20 mg total) by mouth daily with supper. 02/14/17  Yes Sherran Needs, NP    ROS:  Out of a complete 14 system review  of symptoms, the patient complains only of the following symptoms, and all other reviewed systems are negative.  Sleep apnea Back pain  Blood pressure 124/82, pulse 66, height 5\' 8"  (1.727 m), weight 158 lb 8 oz (71.9 kg).  Physical Exam  General: The patient is alert and cooperative at the time of the examination.  Neuromuscular: Range of movement the low back is full.  Skin: No significant peripheral edema is noted.   Neurologic Exam  Mental status: The patient is alert and oriented x 3 at the time of the examination. The patient has apparent normal recent and remote memory, with an apparently normal attention span and concentration ability.   Cranial nerves: Facial symmetry is present. Speech is normal, no aphasia or dysarthria is noted. Extraocular movements are full. Visual  fields are full.  Motor: The patient has good strength in all 4 extremities.  Sensory examination: Soft touch sensation is symmetric on the face, arms, and legs.  Coordination: The patient has good finger-nose-finger and heel-to-shin bilaterally.  Gait and station: The patient has a normal gait. Tandem gait is minimally unsteady. Romberg is negative. No drift is seen.  The patient is able to walk on heels and the toes bilaterally.  Reflexes: Deep tendon reflexes are symmetric.   Carotid doppler study 01/08/17:  Summary:  - The vertebral arteries appear patent with antegrade flow. - Findings consistent with a 1- 70 percent stenosis involving the right internal carotid artery and the left internal carotid artery.   Assessment/Plan:  1.  Chronic low back pain, right leg pain  2.  Atrial flutter  The back pain issue is being managed by Dr. Nelva Bush.  The patient appears to have a facet joint pain syndrome.  At this point, correction of the atrial flutter is like to improve his symptoms of near syncope.  He will follow-up through this office on an as-needed basis.  Jill Alexanders MD 05/03/2017 7:38 AM  [facility] [address] [place], [place] [address]  Phone [phone] Fax [phone]

## 2017-05-04 ENCOUNTER — Ambulatory Visit (HOSPITAL_BASED_OUTPATIENT_CLINIC_OR_DEPARTMENT_OTHER): Payer: Medicare Other | Attending: Nurse Practitioner | Admitting: Cardiology

## 2017-05-04 DIAGNOSIS — I4891 Unspecified atrial fibrillation: Secondary | ICD-10-CM | POA: Insufficient documentation

## 2017-05-04 DIAGNOSIS — G4733 Obstructive sleep apnea (adult) (pediatric): Secondary | ICD-10-CM | POA: Insufficient documentation

## 2017-05-04 DIAGNOSIS — I481 Persistent atrial fibrillation: Secondary | ICD-10-CM | POA: Diagnosis not present

## 2017-05-04 DIAGNOSIS — I4819 Other persistent atrial fibrillation: Secondary | ICD-10-CM

## 2017-05-08 ENCOUNTER — Ambulatory Visit: Payer: Medicare Other | Admitting: Cardiology

## 2017-05-08 ENCOUNTER — Other Ambulatory Visit (HOSPITAL_BASED_OUTPATIENT_CLINIC_OR_DEPARTMENT_OTHER): Payer: Self-pay

## 2017-05-08 DIAGNOSIS — I4891 Unspecified atrial fibrillation: Secondary | ICD-10-CM

## 2017-05-11 ENCOUNTER — Ambulatory Visit (INDEPENDENT_AMBULATORY_CARE_PROVIDER_SITE_OTHER): Payer: Medicare Other | Admitting: Cardiology

## 2017-05-11 ENCOUNTER — Encounter: Payer: Self-pay | Admitting: Cardiology

## 2017-05-11 DIAGNOSIS — I480 Paroxysmal atrial fibrillation: Secondary | ICD-10-CM

## 2017-05-11 DIAGNOSIS — I471 Supraventricular tachycardia: Secondary | ICD-10-CM

## 2017-05-11 DIAGNOSIS — I428 Other cardiomyopathies: Secondary | ICD-10-CM | POA: Diagnosis not present

## 2017-05-11 DIAGNOSIS — I493 Ventricular premature depolarization: Secondary | ICD-10-CM | POA: Diagnosis not present

## 2017-05-11 MED ORDER — SACUBITRIL-VALSARTAN 24-26 MG PO TABS: 1.0000 | ORAL_TABLET | Freq: Two times a day (BID) | ORAL | 3 refills | Status: DC

## 2017-05-11 NOTE — Addendum Note (Signed)
Addended by: Marlis Edelson C on: 05/11/2017 02:02 PM   Modules accepted: Orders

## 2017-05-11 NOTE — Progress Notes (Signed)
Electrophysiology Office Note   Date:  05/11/2017   ID:  Billy Carpenter, DOB March 09, 1946, MRN [id number]  PCP:  Aurea Graff.Marlou Sa, MD  Cardiologist:   Primary Electrophysiologist:  Lizzeth Meder Meredith Leeds, MD    Chief Complaint  Patient presents with  . Palpitations     History of Present Illness: Billy Carpenter is a 71 y.o. male who is being seen today for the evaluation of palpitations/lightheadedness at the request of Alroy Dust, L.Marlou Sa, MD. Presenting today for electrophysiology evaluation. He is been having palpitations and lightheadedness. This mainly occurs with exertion. He wore cardiac monitor that showed multiple PVCs, SVT, possibly atrial fibrillation. He was put on Xarelto. Is also put on diltiazem. He is felt much improved since starting these medications. That being said, he has been having difficulty doing his daily activities such as playing golf and working in the yard. He does have spinal stenosis and this potentially limits his activity as well. He has not had syncope but has gotten very lightheaded.  Today, denies symptoms of chest pain, shortness of breath, orthopnea, PND, lower extremity edema, claudication, dizziness, presyncope, syncope, bleeding, or neurologic sequela. The patient is tolerating medications without difficulties.  He does continue to have palpitations, but they are much improved.  His palpitations mainly occur when he is exerting himself.  He wore a Holter monitor that showed 5% PVCs, no atrial fibrillation and no SVT.  His palpitation generally go away when he rests.  He is continued to have issues with back pain.   Past Medical History:  Diagnosis Date  . BPH (benign prostatic hyperplasia)   . Carotid artery disease ([place])    Left internal carotid artery spontaneous dissection in the past  //   Doppler, October, 2013, minimal plaque, 0-39% bilateral  . Chest discomfort    nuclear, March, 2010, no ischemia ( question of decreased ejection fraction but  followup echo showed ejection fraction to be 55%)  . Chronic low back pain 05/03/2017  . CVA (cerebral infarction)    left insular cortex stroke with left internal carotid artery dissection and near occlusion  . Dizziness    some vertigo and some positional  . Dyslipidemia   . Ejection fraction    . EF 55%, echo, March, 2010  . Hematuria   . Hypertension   . Mitral regurgitation    mild, echo, March, 2010  . Supraventricular tachycardia [facility])    Past Surgical History:  Procedure Laterality Date  . BLADDER STONE REMOVAL    . PROSTATE SURGERY  2009  . WRIST SURGERY     Right     Current Outpatient Medications  Medication Sig Dispense Refill  . atorvastatin (LIPITOR) 20 MG tablet Take 1 tablet (20 mg total) by mouth daily. 90 tablet 3  . metoprolol succinate (TOPROL-XL) 100 MG 24 hr tablet Take 1 tablet (100 mg total) daily by mouth. Take with or immediately following a meal. 90 tablet 3  . rivaroxaban (XARELTO) 20 MG TABS tablet Take 1 tablet (20 mg total) by mouth daily with supper. 30 tablet 3  . sacubitril-valsartan (ENTRESTO) 24-26 MG Take 1 tablet by mouth 2 (two) times daily. 60 tablet 3   No current facility-administered medications for this visit.     Allergies:   Patient has no known allergies.   Social History:  The patient  reports that  has never smoked. he has never used smokeless tobacco. He reports that he drinks alcohol. He reports that he does not use drugs.  Family History:  The patient's family history includes Arrhythmia in his mother; Arthritis in his mother; Hypertension in his mother; Other in his father; Prostate cancer in his father.   ROS:  Please see the history of present illness.   Otherwise, review of systems is positive for palpitations, DOE.   All other systems are reviewed and negative.   PHYSICAL EXAM: VS:  There were no vitals taken for this visit. , BMI There is no height or weight on file to calculate BMI. GEN: Well nourished, well  developed, in no acute distress  HEENT: normal  Neck: no JVD, carotid bruits, or masses Cardiac: RRR; no murmurs, rubs, or gallops,no edema  Respiratory:  clear to auscultation bilaterally, normal work of breathing GI: soft, nontender, nondistended, + BS MS: no deformity or atrophy  Skin: warm and dry Neuro:  Strength and sensation are intact Psych: euthymic mood, full affect  EKG:  EKG is ordered today. Personal review of the ekg ordered shows sinus rhythm, rate 56   Recent Labs: 11/12/2016: ALT 19; BUN 13; Creatinine, Ser 0.94; Hemoglobin 15.0; Platelets 161; Potassium 4.0; Sodium 138    Lipid Panel  No results found for: CHOL, TRIG, HDL, CHOLHDL, VLDL, LDLCALC, LDLDIRECT   Wt Readings from Last 3 Encounters:  05/04/17 152 lb (68.9 kg)  05/03/17 158 lb 8 oz (71.9 kg)  02/19/17 159 lb (72.1 kg)      Other studies Reviewed: Additional studies/ records that were reviewed today include: TTE 02/15/17  Review of the above records today demonstrates:  - Left ventricle: The cavity size was normal. There was mild focal   basal hypertrophy of the septum. Systolic function was moderately   to severely reduced. The estimated ejection fraction was in the   range of 30% to 35%. Diffuse hypokinesis. Doppler parameters are   consistent with abnormal left ventricular relaxation (grade 1   diastolic dysfunction). There was no evidence of elevated   ventricular filling pressure by Doppler parameters. - Aortic valve: There was mild regurgitation. - Mitral valve: There was mild regurgitation. - Left atrium: The atrium was mildly dilated. - Right ventricle: The cavity size was mildly dilated. Wall   thickness was normal. Systolic function was mildly reduced. - Right atrium: The atrium was moderately dilated. - Tricuspid valve: There was mild regurgitation. - Pulmonic valve: There was mild regurgitation. - Pulmonary arteries: Systolic pressure was within the normal   range. - Inferior vena  cava: The vessel was normal in size.  Holter 03/16/17 - personally reviewed Minimum HR: 43 BPM at 3:43:29 AM Maximum HR: 118 BPM at 11:34:15 AM Average HR: 65 BPM 5% PVCs, 1% APCs Sinus rhythm Zero atrial fibrillation   ASSESSMENT AND PLAN:  1.  PVCs: Seen on monitor and having minimal symptoms.  Symptoms mainly occur when he is exerting himself.  Holter monitor showed 5% PVCs.  Salvador Coupe hold off on further therapy.  2. Paroxysmal atrial fibrillation: Currently anticoagulated with Xarelto.  Holter monitor showed no further episodes of atrial fibrillation.  This patients CHA2DS2-VASc Score and unadjusted Ischemic Stroke Rate (% per year) is equal to 4.8 % stroke rate/year from a score of 4  Above score calculated as 1 point each if present [CHF, HTN, DM, Vascular=MI/PAD/Aortic Plaque, Age if 65-74, or Male] Above score calculated as 2 points each if present [Age > 75, or Stroke/TIA/TE]    3. SVT: No further episodes seen.  Patient is currently asymptomatic.  No changes.  4. Systolic heart failure, duration unknown:  Ejection fraction 30-35%.  Currently on Toprol-XL 100 mg.  We Brennley Curtice switch his losartan to Entresto to see if this Carla Rashad help out with some of his fatigue and weakness.  May also help with his PVC burden.  We Eilan Mcinerny plan for an echo prior to his next visit to see if his ejection fraction has improved on optimal medical therapy.  Current medicines are reviewed at length with the patient today.   The patient does not have concerns regarding his medicines.  The following changes were made today:  Stop diltiazem, increase Toprol-XL, start losartan  Labs/ tests ordered today include:  Orders Placed This Encounter  Procedures  . ECHOCARDIOGRAM COMPLETE     Disposition:   FU with Nysir Fergusson 6 months  Signed, Renner Sebald Meredith Leeds, MD  05/11/2017 12:03 PM     [place] [address] [phone] (office) [phone] (fax)

## 2017-05-11 NOTE — Patient Instructions (Addendum)
Medication Instructions:  Your physician has recommended you make the following change in your medication:  1. STOP Losartan 2. START Entresto 24/26 mg twice daily  * If you need a refill on your cardiac medications before your next appointment, please call your pharmacy. *  Labwork: None ordered  Testing/Procedures: Your physician has requested that you have an echocardiogram in 6 months - prior to your follow up with Dr. Curt Bears. Echocardiography is a painless test that uses sound waves to create images of your heart. It provides your doctor with information about the size and shape of your heart and how well your heart's chambers and valves are working. This procedure takes approximately one hour. There are no restrictions for this procedure.   Follow-Up: Your physician recommends that you schedule a follow-up appointment in: 2 weeks, with pharmacist, to [facility]  Your physician wants you to follow-up in: 6 months with Dr. Curt Bears (after you have completed your echocardiogram).  You will receive a reminder letter in the mail two months in advance. If you don't receive a letter, please call our office to schedule the follow-up appointment.  Thank you for choosing [facility]!!   Trinidad Curet, RN [phone]  Any Other Special Instructions Will Be Listed Below (If Applicable).  Sacubitril; Valsartan oral tablet What is this medicine? SACUBITRIL; VALSARTAN (sak UE bi tril; val SAR tan) is a combination of 2 drugs used to reduce the risk of death and hospitalizations in people with long-lasting heart failure. It is usually used with other medicines to treat heart failure. This medicine may be used for other purposes; ask your health care provider or pharmacist if you have questions. COMMON BRAND NAME(S): Entresto What should I tell my health care provider before I take this medicine? They need to know if you have any of these conditions: -diabetes and take a  medicine that contains aliskiren -kidney disease -liver disease -an unusual or allergic reaction to sacubitril; valsartan, drugs called angiotensin converting enzyme (ACE) inhibitors, angiotensin II receptor blockers (ARBs), other medicines, foods, dyes, or preservatives -pregnant or trying to get pregnant -breast-feeding How should I use this medicine? Take this medicine by mouth with a glass of water. Follow the directions on the prescription label. You can take it with or without food. If it upsets your stomach, take it with food. Take your medicine at regular intervals. Do not take it more often than directed. Do not stop taking except on your doctor's advice. Do not take this medicine for at least 36 hours before or after you take an ACE inhibitor medicine. Talk to your health care provider if you are not sure if you take an ACE inhibitor. Talk to your pediatrician regarding the use of this medicine in children. Special care may be needed. Overdosage: If you think you have taken too much of this medicine contact a poison control center or emergency room at once. NOTE: This medicine is only for you. Do not share this medicine with others. What if I miss a dose? If you miss a dose, take it as soon as you can. If it is almost time for next dose, take only that dose. Do not take double or extra doses. What may interact with this medicine? Do not take this medicine with any of the following medicines: -aliskiren if you have diabetes -angiotensin-converting enzyme (ACE) inhibitors, like benazepril, captopril, enalapril, fosinopril, lisinopril, or ramipril This medicine may also interact with the following medicines: -angiotensin II receptor blockers (ARBs) like azilsartan,  candesartan, eprosartan, irbesartan, losartan, olmesartan, telmisartan, or valsartan -lithium -NSAIDS, medicines for pain and inflammation, like ibuprofen or naproxen -potassium-sparing diuretics like amiloride,  spironolactone, and triamterene -potassium supplements This list may not describe all possible interactions. Give your health care provider a list of all the medicines, herbs, non-prescription drugs, or dietary supplements you use. Also tell them if you smoke, drink alcohol, or use illegal drugs. Some items may interact with your medicine. What should I watch for while using this medicine? Tell your doctor or healthcare professional if your symptoms do not start to get better or if they get worse. Do not become pregnant while taking this medicine. Women should inform their doctor if they wish to become pregnant or think they might be pregnant. There is a potential for serious side effects to an unborn child. Talk to your health care professional or pharmacist for more information. You may get dizzy. Do not drive, use machinery, or do anything that needs mental alertness until you know how this medicine affects you. Do not stand or sit up quickly, especially if you are an older patient. This reduces the risk of dizzy or fainting spells. Avoid alcoholic drinks; they can make you more dizzy. What side effects may I notice from receiving this medicine? Side effects that you should report to your doctor or health care professional as soon as possible: -allergic reactions like skin rash, itching or hives, swelling of the face, lips, or tongue -signs and symptoms of increased potassium like muscle weakness; chest pain; or fast, irregular heartbeat -signs and symptoms of kidney injury like trouble passing urine or change in the amount of urine -signs and symptoms of low blood pressure like feeling dizzy or lightheaded, or if you develop extreme fatigue Side effects that usually do not require medical attention (report to your doctor or health care professional if they continue or are bothersome): -cough This list may not describe all possible side effects. Call your doctor for medical advice about side  effects. You may report side effects to FDA at 1-800-FDA-1088. Where should I keep my medicine? Keep out of the reach of children. Store at room temperature between 15 and 30 degrees C (59 and 86 degrees F). Throw away any unused medicine after the expiration date. NOTE: This sheet is a summary. It may not cover all possible information. If you have questions about this medicine, talk to your doctor, pharmacist, or health care provider.  2018 Elsevier/Gold Standard (2015-06-30 13:54:19)   Heart-Healthy Eating Plan Many factors influence your heart health, including eating and exercise habits. Heart (coronary) risk increases with abnormal blood fat (lipid) levels. Heart-healthy meal planning includes limiting unhealthy fats, increasing healthy fats, and making other small dietary changes. This includes maintaining a healthy body weight to help keep lipid levels within a normal range. What is my plan? Your health care provider recommends that you:  Get no more than _________% of the total calories in your daily diet from fat.  Limit your intake of saturated fat to less than _________% of your total calories each day.  Limit the amount of cholesterol in your diet to less than _________ mg per day.  What types of fat should I choose?  Choose healthy fats more often. Choose monounsaturated and polyunsaturated fats, such as olive oil and canola oil, flaxseeds, walnuts, almonds, and seeds.  Eat more omega-3 fats. Good choices include salmon, mackerel, sardines, tuna, flaxseed oil, and ground flaxseeds. Aim to eat fish at least two times each week.  Limit saturated fats. Saturated fats are primarily found in animal products, such as meats, butter, and cream. Plant sources of saturated fats include palm oil, palm kernel oil, and coconut oil.  Avoid foods with partially hydrogenated oils in them. These contain trans fats. Examples of foods that contain trans fats are stick margarine, some tub  margarines, cookies, crackers, and other baked goods. What general guidelines do I need to follow?  Check food labels carefully to identify foods with trans fats or high amounts of saturated fat.  Fill one half of your plate with vegetables and green salads. Eat 4-5 servings of vegetables per day. A serving of vegetables equals 1 cup of raw leafy vegetables,  cup of raw or cooked cut-up vegetables, or  cup of vegetable juice.  Fill one fourth of your plate with whole grains. Look for the word "whole" as the first word in the ingredient list.  Fill one fourth of your plate with lean protein foods.  Eat 4-5 servings of fruit per day. A serving of fruit equals one medium whole fruit,  cup of dried fruit,  cup of fresh, frozen, or canned fruit, or  cup of 100% fruit juice.  Eat more foods that contain soluble fiber. Examples of foods that contain this type of fiber are apples, broccoli, carrots, beans, peas, and barley. Aim to get 20-30 g of fiber per day.  Eat more home-cooked food and less restaurant, buffet, and fast food.  Limit or avoid alcohol.  Limit foods that are high in starch and sugar.  Avoid fried foods.  Cook foods by using methods other than frying. Baking, boiling, grilling, and broiling are all great options. Other fat-reducing suggestions include: ? Removing the skin from poultry. ? Removing all visible fats from meats. ? Skimming the fat off of stews, soups, and gravies before serving them. ? Steaming vegetables in water or broth.  Lose weight if you are overweight. Losing just 5-10% of your initial body weight can help your overall health and prevent diseases such as diabetes and heart disease.  Increase your consumption of nuts, legumes, and seeds to 4-5 servings per week. One serving of dried beans or legumes equals  cup after being cooked, one serving of nuts equals 1 ounces, and one serving of seeds equals  ounce or 1 tablespoon.  You may need to monitor  your salt (sodium) intake, especially if you have high blood pressure. Talk with your health care provider or dietitian to get more information about reducing sodium. What foods can I eat? Grains  Breads, including [place], white, pita, wheat, raisin, rye, oatmeal, and [place]. Tortillas that are neither fried nor made with lard or trans fat. Low-fat rolls, including hotdog and hamburger buns and English muffins. Biscuits. Muffins. Waffles. Pancakes. Light popcorn. Whole-grain cereals. Flatbread. Melba toast. Pretzels. Breadsticks. Rusks. Low-fat snacks and crackers, including oyster, saltine, matzo, graham, animal, and rye. Rice and pasta, including brown rice and those that are made with whole wheat. Vegetables All vegetables. Fruits All fruits, but limit coconut. Meats and Other Protein Sources Lean, well-trimmed beef, veal, pork, and lamb. Chicken and [place] without skin. All fish and shellfish. Wild duck, rabbit, pheasant, and venison. Egg whites or low-cholesterol egg substitutes. Dried beans, peas, lentils, and tofu.Seeds and most nuts. Dairy Low-fat or nonfat cheeses, including ricotta, string, and mozzarella. Skim or 1% milk that is liquid, powdered, or evaporated. Buttermilk that is made with low-fat milk. Nonfat or low-fat yogurt. Beverages Mineral water. Diet carbonated beverages. Sweets  and Desserts Sherbets and fruit ices. Honey, jam, marmalade, jelly, and syrups. Meringues and gelatins. Pure sugar candy, such as hard candy, jelly beans, gumdrops, mints, marshmallows, and small amounts of dark chocolate. [employer] Eat all sweets and desserts in moderation. Fats and Oils Nonhydrogenated (trans-free) margarines. Vegetable oils, including soybean, sesame, sunflower, olive, peanut, safflower, corn, canola, and cottonseed. Salad dressings or mayonnaise that are made with a vegetable oil. Limit added fats and oils that you use for cooking, baking, salads, and as  spreads. Other Cocoa powder. Coffee and tea. All seasonings and condiments. The items listed above may not be a complete list of recommended foods or beverages. Contact your dietitian for more options. What foods are not recommended? Grains Breads that are made with saturated or trans fats, oils, or whole milk. Croissants. Butter rolls. Cheese breads. Sweet rolls. Donuts. Buttered popcorn. Chow mein noodles. High-fat crackers, such as cheese or butter crackers. Meats and Other Protein Sources Fatty meats, such as hotdogs, short ribs, sausage, spareribs, bacon, ribeye roast or steak, and mutton. High-fat deli meats, such as salami and bologna. Caviar. Domestic duck and goose. Organ meats, such as kidney, liver, sweetbreads, brains, gizzard, chitterlings, and heart. Dairy Cream, sour cream, cream cheese, and creamed cottage cheese. Whole milk cheeses, including blue (bleu), [place], [place], [place], American, [place], Swiss, [place], [place], and [place]. Whole or 2% milk that is liquid, evaporated, or condensed. Whole buttermilk. Cream sauce or high-fat cheese sauce. Yogurt that is made from whole milk. Beverages Regular sodas and drinks with added sugar. Sweets and Desserts Frosting. Pudding. Cookies. Cakes other than angel food cake. Candy that has milk chocolate or white chocolate, hydrogenated fat, butter, coconut, or unknown ingredients. Buttered syrups. Full-fat ice cream or ice cream drinks. Fats and Oils Gravy that has suet, meat fat, or shortening. Cocoa butter, hydrogenated oils, palm oil, coconut oil, palm kernel oil. These can often be found in baked products, candy, fried foods, nondairy creamers, and whipped toppings. Solid fats and shortenings, including bacon fat, salt pork, lard, and butter. Nondairy cream substitutes, such as coffee creamers and sour cream substitutes. Salad dressings that are made of unknown oils, cheese, or sour cream. The items listed above may not be a  complete list of foods and beverages to avoid. Contact your dietitian for more information. This information is not intended to replace advice given to you by your health care provider. Make sure you discuss any questions you have with your health care provider. Document Released: 02/22/2008 Document Revised: 12/03/2015 Document Reviewed: 11/06/2013 Elsevier Interactive Patient Education  2017 [place] With Less [employer] with less salt is one way to reduce the amount of sodium you get from food. Depending on your condition and overall health, your health care provider or diet and nutrition specialist (dietitian) may recommend that you reduce your sodium intake. Most people should have less than 2,300 milligrams (mg) of sodium each day. If you have high blood pressure (hypertension), you may need to limit your sodium to 1,500 mg each day. Follow the tips below to help reduce your sodium intake. What do I need to know about cooking with less salt? Shopping  Buy sodium-free or low-sodium products. Look for the following words on food labels: ? Low-sodium. ? Sodium-free. ? Reduced-sodium. ? No salt added. ? Unsalted.  Buy fresh or frozen vegetables. Avoid canned vegetables.  Avoid buying meats or protein foods that have been injected with broth or saline solution.  Avoid  cured or smoked meats, such as hot dogs, bacon, salami, ham, and bologna. Reading food labels  Check the food label before buying or using packaged ingredients.  Look for products with no more than 140 mg of sodium in one serving.  Do not choose foods with salt as one of the first three ingredients on the ingredients list. If salt is one of the first three ingredients, it usually means the item is high in sodium, because ingredients are listed in order of amount in the food item. Cooking  Use herbs, seasonings without salt, and spices as substitutes for salt in foods.  Use sodium-free baking soda  when baking.  Grill, braise, or roast foods to add flavor with less salt.  Avoid adding salt to pasta, rice, or hot cereals while cooking.  Drain and rinse canned vegetables before use.  Avoid adding salt when cooking sweets and desserts.  Cook with low-sodium ingredients. What are some salt alternatives? The following are herbs, seasonings, and spices that can be used instead of salt to give taste to your food. Herbs should be fresh or dried. Do not choose packaged mixes. Next to the name of the herb, spice, or seasoning are some examples of foods you can pair it with. Herbs  Bay leaves - Soups, meat and vegetable dishes, and spaghetti sauce.  Basil - [employer], soups, pasta, and fish dishes.  Cilantro - Meat, poultry, and vegetable dishes.  Chili powder - Marinades and Mexican dishes.  Chives - Salad dressings and potato dishes.  Cumin - Mexican dishes, couscous, and meat dishes.  Dill - Fish dishes, sauces, and salads.  Fennel - Meat and vegetable dishes, breads, and cookies.  Garlic (do not use garlic salt) - [place] dishes, meat dishes, salad dressings, and sauces.  Marjoram - Soups, potato dishes, and meat dishes.  Oregano - Pizza and spaghetti sauce.  Parsley - Salads, soups, pasta, and meat dishes.  Rosemary - [place] dishes, salad dressings, soups, and red meats.  Saffron - Fish dishes, pasta, and some poultry dishes.  Sage - Stuffings and sauces.  Tarragon - Fish and [employer].  Thyme - Stuffing, meat, and fish dishes. Seasonings  Lemon juice - Fish dishes, poultry dishes, vegetables, and salads.  Vinegar - Salad dressings, vegetables, and fish dishes. Spices  Cinnamon - Sweet dishes, such as cakes, cookies, and puddings.  Cloves - Gingerbread, puddings, and marinades for meats.  Curry - Vegetable dishes, fish and poultry dishes, and stir-fry dishes.  Ginger - Vegetables dishes, fish dishes, and stir-fry dishes.  Nutmeg - Pasta,  vegetables, poultry, fish dishes, and custard. What are some low-sodium ingredients and foods?  Fresh or frozen fruits and vegetables with no sauce added.  Fresh or frozen whole meats, poultry, and fish with no sauce added.  Eggs.  Noodles, pasta, quinoa, rice.  Shredded or puffed wheat or puffed rice.  Regular or quick oats.  Milk, yogurt, hard cheeses, and low-sodium cheeses. Good cheese choices include Swiss, [place]. Always check the label for the serving size and sodium content.  Unsalted butter or margarine.  Unsalted nuts.  Sherbet or ice cream (keep to  cup per serving).  Homemade pudding.  Sodium-free baking soda and baking powder. This is not a complete list of low-sodium ingredients and foods. Contact your dietitian for more options. Summary  Cooking with less salt is one way to reduce the amount of sodium that you get from food.  Buy sodium-free or low-sodium products.  Check  the food label before using or buying packaged ingredients.  Use herbs, seasonings without salt, and spices as substitutes for salt in foods. This information is not intended to replace advice given to you by your health care provider. Make sure you discuss any questions you have with your health care provider. Document Released: 05/15/2005 Document Revised: 05/23/2016 Document Reviewed: 05/23/2016 Elsevier Interactive Patient Education  2017 [employer].

## 2017-05-16 NOTE — Procedures (Signed)
   NAME: Billy Carpenter DATE OF BIRTH:  01/09/1946 MEDICAL RECORD NUMBER [id number]  [facility]  PHYSICIAN: Kalani Baray  DATE OF STUDY: 05/04/2017  SLEEP STUDY TYPE: Nocturnal Polysomnogram               REFERRING PHYSICIAN: Sherran Needs, NP  INDICATION FOR STUDY: atrial fibrillation  EPWORTH SLEEPINESS SCORE:   HEIGHT: 5\' 8"  (172.7 cm)  WEIGHT: 152 lb (68.9 kg)    Body mass index is 23.11 kg/m.  NECK SIZE: 15.5 in.  CLINICAL INFORMATION Sleep Study Type: NPSG Indication for sleep study: OSA Epworth Sleepiness Score: 4  SLEEP STUDY TECHNIQUE As per the AASM Manual for the Scoring of Sleep and Associated Events v2.3 (April 2016) with a hypopnea requiring 4% desaturations.  The channels recorded and monitored were frontal, central and occipital EEG, electrooculogram (EOG), submentalis EMG (chin), nasal and oral airflow, thoracic and abdominal wall motion, anterior tibialis EMG, snore microphone, electrocardiogram, and pulse oximetry.  MEDICATIONS Medications self-administered by patient taken the night of the study : ATORVASTATIN, LORASTATIN POTASSIUM, [name] The study was initiated at 9:49:22 PM and ended at 4:03:37 AM.  Sleep onset time was 50.9 minutes and the sleep efficiency was 63.1%. The total sleep time was 236.0 minutes.  Stage REM latency was 195.5 minutes.  The patient spent 6.57% of the night in stage N1 sleep, 66.10% in stage N2 sleep, 12.29% in stage N3 and 15.04% in REM.  Alpha intrusion was absent.  Supine sleep was 100.00%.  RESPIRATORY PARAMETERS The overall apnea/hypopnea index (AHI) was 1.0 per hour. There were 2 total apneas, including 2 obstructive, 0 central and 0 mixed apneas. There were 2 hypopneas and 16 RERAs.  The AHI during Stage REM sleep was 5.1 per hour.  AHI while supine was 1.0 per hour.  The mean oxygen saturation was 92.88%. The minimum SpO2 during sleep was 89.00%.  soft  snoring was noted during this study.  CARDIAC DATA The 2 lead EKG demonstrated sinus rhythm. The mean heart rate was 57.13 beats per minute. Other EKG findings include: PVCs.  LEG MOVEMENT DATA The total PLMS were 1 with a resulting PLMS index of 0.25. Associated arousal with leg movement index was 0.3 .  IMPRESSIONS - No significant obstructive sleep apnea occurred during this study (AHI = 1.0/h). - No significant central sleep apnea occurred during this study (CAI = 0.0/h). - Mild oxygen desaturation was noted during this study (Min O2 = 89.00%). - The patient snored with soft snoring volume. - EKG findings include PVCs. - Clinically significant periodic limb movements did not occur during sleep. No significant associated arousals.  DIAGNOSIS - Normal Study - PVCs  RECOMMENDATIONS - Avoid alcohol, sedatives and other CNS depressants that may worsen sleep apnea and disrupt normal sleep architecture. - Sleep hygiene should be reviewed to assess factors that may improve sleep quality. - Weight management and regular exercise should be initiated or continued if appropriate.   [place], American Board of Sleep Medicine  ELECTRONICALLY SIGNED ON:  05/16/2017, 8:19 PM [place] PH: [phone]   FX: [phone] [place]

## 2017-05-17 ENCOUNTER — Ambulatory Visit: Payer: Medicare Other | Admitting: Cardiology

## 2017-05-18 ENCOUNTER — Telehealth: Payer: Self-pay | Admitting: *Deleted

## 2017-05-18 NOTE — Telephone Encounter (Signed)
Informed patient of sleep study results and patient understanding was verbalized. Patient understands his sleep study showed no sleep apnea but was in normal range at 1/hr. Patient understands he has no further appointments at this time. Patient was grateful for the call and thanked me.

## 2017-05-18 NOTE — Telephone Encounter (Signed)
-----   Message from Billy Margarita, MD sent at 05/16/2017  8:23 PM EST ----- Please let patient know that sleep study showed no significant sleep apnea.

## 2017-05-31 DIAGNOSIS — M9903 Segmental and somatic dysfunction of lumbar region: Secondary | ICD-10-CM | POA: Diagnosis not present

## 2017-05-31 DIAGNOSIS — M5441 Lumbago with sciatica, right side: Secondary | ICD-10-CM | POA: Diagnosis not present

## 2017-06-04 DIAGNOSIS — M5441 Lumbago with sciatica, right side: Secondary | ICD-10-CM | POA: Diagnosis not present

## 2017-06-04 DIAGNOSIS — M9903 Segmental and somatic dysfunction of lumbar region: Secondary | ICD-10-CM | POA: Diagnosis not present

## 2017-06-05 ENCOUNTER — Encounter (INDEPENDENT_AMBULATORY_CARE_PROVIDER_SITE_OTHER): Payer: Self-pay

## 2017-06-05 ENCOUNTER — Ambulatory Visit (INDEPENDENT_AMBULATORY_CARE_PROVIDER_SITE_OTHER): Payer: Medicare Other | Admitting: Pharmacist

## 2017-06-05 ENCOUNTER — Other Ambulatory Visit: Payer: Self-pay | Admitting: Cardiology

## 2017-06-05 DIAGNOSIS — I5020 Unspecified systolic (congestive) heart failure: Secondary | ICD-10-CM | POA: Insufficient documentation

## 2017-06-05 LAB — BASIC METABOLIC PANEL
BUN/Creatinine Ratio: 17 (ref 10–24)
BUN: 18 mg/dL (ref 8–27)
CHLORIDE: 103 mmol/L (ref 96–106)
CO2: 24 mmol/L (ref 20–29)
Calcium: 9.2 mg/dL (ref 8.6–10.2)
Creatinine, Ser: 1.03 mg/dL (ref 0.76–1.27)
GFR calc non Af Amer: 72 mL/min/{1.73_m2} (ref >59)
GFR, EST AFRICAN AMERICAN: 83 mL/min/{1.73_m2} (ref >59)
GLUCOSE: 74 mg/dL (ref 65–99)
POTASSIUM: 4.3 mmol/L (ref 3.5–5.2)
SODIUM: 141 mmol/L (ref 134–144)

## 2017-06-05 NOTE — Patient Instructions (Addendum)
Thanks for coming to see [place]!   Sounds like you're doing much better! Continue all of your medications and exercise routine. Please keep an eye on your lightheadedness and log these episodes so we can see if there is any correlation.   Labs today, I will call you if there are any issues.   Please follow up with Dr. Curt Bears and Dr. Acie Fredrickson as usual.

## 2017-06-05 NOTE — Progress Notes (Signed)
Patient ID: Billy Carpenter                 DOB: [birth year]                      MRN: [id number]     HPI: Billy Carpenter is a 72 y.o. male referred by Dr. Curt Bears to pharmacy for [facility] start follow up and heart failure medication optimization. PMH is significant for HTN, possible Afib and PVCs, h/o CVA on Xarelto, dyslipidemia, mitral regurgitation, CAD, HFrEF (LVEF 30-35% in 01/2017). Patient was recently seen by Dr. Curt Bears in December, 2018 for f/u of palpitations and lightheadedness + cardiac monitor resulting with multiple PVCs, SVT, possible Afib. At that time, patient was switched from losartan to Entresto, Toprol XL was increased and diltiazem was discontinued.   Today, patient reports that he feels better now that he is on Entresto, stating that he has experienced no palpitations since switch from losartan. Reports Delene Loll is affordable at this time. Does endorse dizzy spells 2-3 times weekly that cause him to stop activity briefly. He denies falls, syncope, blackened vision, falls, CP, PND, orthopnea. No lower extremity edema appreciated. Does report some DOE but only when riding his bike x15 minutes. Walking tolerance limited by back pain.   Last took meds at ~8AM this morning. Reports BP 93/75 mmHg this morning. Felt a little dizzy.  Current HF meds: Toprol XL 100 mg daily, Entresto Previously tried: losartan (d/c'd once switched to [employer])   Family History: arrhythmia and hypertension in mother  Social History: never smoked or used smokeless tobacco. Drinks alcohol. Denies illicit drug use.  Diet: Coffee (half caf) 2 cups daily. 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 Eats out 4 times a week. Some fast food for lunch ~2 times weekly. Has to be careful with salt because he gets a "salt hangover" which causes neck pain.   Exercise: golf twice weekly, bike every morning for 15 minutes (stationary bike), set of core exercises x30 minutes every morning; used to be a runner  x48 years  Home BP readings: From memory: 110s-120/70s HR 60. Has bicep cuff at home.  Wt Readings from Last 3 Encounters:  05/04/17 152 lb (68.9 kg)  05/03/17 158 lb 8 oz (71.9 kg)  02/19/17 159 lb (72.1 kg)   BP Readings from Last 3 Encounters:  05/03/17 124/82  02/19/17 (!) 124/92  02/14/17 116/74   Pulse Readings from Last 3 Encounters:  05/03/17 66  02/19/17 70  02/14/17 63    Renal function: CrCl cannot be calculated (Patient's most recent lab result is older than the maximum 21 days allowed.).  Past Medical History:  Diagnosis Date  . BPH (benign prostatic hyperplasia)   . Carotid artery disease ([place])    Left internal carotid artery spontaneous dissection in the past  //   Doppler, October, 2013, minimal plaque, 0-39% bilateral  . Chest discomfort    nuclear, March, 2010, no ischemia ( question of decreased ejection fraction but followup echo showed ejection fraction to be 55%)  . Chronic low back pain 05/03/2017  . CVA (cerebral infarction)    left insular cortex stroke with left internal carotid artery dissection and near occlusion  . Dizziness    some vertigo and some positional  . Dyslipidemia   . Ejection fraction    . EF 55%, echo, March, 2010  . Hematuria   . Hypertension   . Mitral regurgitation    mild, echo,  March, 2010  . Supraventricular tachycardia ([place])     Current Outpatient Medications on File Prior to Visit  Medication Sig Dispense Refill  . atorvastatin (LIPITOR) 20 MG tablet Take 1 tablet (20 mg total) by mouth daily. 90 tablet 3  . metoprolol succinate (TOPROL-XL) 100 MG 24 hr tablet Take 1 tablet (100 mg total) daily by mouth. Take with or immediately following a meal. 90 tablet 3  . rivaroxaban (XARELTO) 20 MG TABS tablet Take 1 tablet (20 mg total) by mouth daily with supper. 30 tablet 3  . sacubitril-valsartan (ENTRESTO) 24-26 MG Take 1 tablet by mouth 2 (two) times daily. 60 tablet 3   No current facility-administered medications on  file prior to visit.     No Known Allergies   Assessment/Plan:   1. Heart Failure Medication Optimization - Patient with HFrEF with last LVEF 30-35% on ECHO in 01/2017. Patient optimized on Toprol XL with HR in low 60s. Entresto dose titration or spironolactone initiation limited dizzy spells at home. Will continue current meds, patient to call if dizzy spells worsen or become intolerable. BMET today with recent switch from losartan to [facility]. Patient to follow up with Drs. Camnitz and Location manager.   Carlean Jews, Pharm.D., BCPS PGY2 Ambulatory Care Pharmacy Resident Phone: [phone]

## 2017-06-06 DIAGNOSIS — M5441 Lumbago with sciatica, right side: Secondary | ICD-10-CM | POA: Diagnosis not present

## 2017-06-06 DIAGNOSIS — M9903 Segmental and somatic dysfunction of lumbar region: Secondary | ICD-10-CM | POA: Diagnosis not present

## 2017-06-11 DIAGNOSIS — M9903 Segmental and somatic dysfunction of lumbar region: Secondary | ICD-10-CM | POA: Diagnosis not present

## 2017-06-11 DIAGNOSIS — M5441 Lumbago with sciatica, right side: Secondary | ICD-10-CM | POA: Diagnosis not present

## 2017-06-13 DIAGNOSIS — M5441 Lumbago with sciatica, right side: Secondary | ICD-10-CM | POA: Diagnosis not present

## 2017-06-13 DIAGNOSIS — M9903 Segmental and somatic dysfunction of lumbar region: Secondary | ICD-10-CM | POA: Diagnosis not present

## 2017-06-18 DIAGNOSIS — M9903 Segmental and somatic dysfunction of lumbar region: Secondary | ICD-10-CM | POA: Diagnosis not present

## 2017-06-18 DIAGNOSIS — M5441 Lumbago with sciatica, right side: Secondary | ICD-10-CM | POA: Diagnosis not present

## 2017-06-20 ENCOUNTER — Ambulatory Visit: Payer: Medicare Other | Admitting: Cardiology

## 2017-06-20 DIAGNOSIS — M5441 Lumbago with sciatica, right side: Secondary | ICD-10-CM | POA: Diagnosis not present

## 2017-06-20 DIAGNOSIS — M9903 Segmental and somatic dysfunction of lumbar region: Secondary | ICD-10-CM | POA: Diagnosis not present

## 2017-06-25 ENCOUNTER — Emergency Department (HOSPITAL_COMMUNITY): Payer: Medicare Other

## 2017-06-25 ENCOUNTER — Other Ambulatory Visit: Payer: Self-pay

## 2017-06-25 ENCOUNTER — Inpatient Hospital Stay (HOSPITAL_COMMUNITY)
Admission: EM | Admit: 2017-06-25 | Discharge: 2017-06-28 | DRG: 287 | Disposition: A | Discharge status: Home / Self Care | Payer: Medicare Other | Attending: Cardiology | Admitting: Cardiology

## 2017-06-25 ENCOUNTER — Encounter (HOSPITAL_COMMUNITY): Payer: Self-pay | Admitting: Emergency Medicine

## 2017-06-25 DIAGNOSIS — I420 Dilated cardiomyopathy: Secondary | ICD-10-CM | POA: Diagnosis present

## 2017-06-25 DIAGNOSIS — R55 Syncope and collapse: Secondary | ICD-10-CM | POA: Diagnosis not present

## 2017-06-25 DIAGNOSIS — I340 Nonrheumatic mitral (valve) insufficiency: Secondary | ICD-10-CM | POA: Diagnosis present

## 2017-06-25 DIAGNOSIS — I481 Persistent atrial fibrillation: Secondary | ICD-10-CM | POA: Diagnosis not present

## 2017-06-25 DIAGNOSIS — I493 Ventricular premature depolarization: Secondary | ICD-10-CM | POA: Diagnosis present

## 2017-06-25 DIAGNOSIS — R404 Transient alteration of awareness: Secondary | ICD-10-CM | POA: Diagnosis not present

## 2017-06-25 DIAGNOSIS — I361 Nonrheumatic tricuspid (valve) insufficiency: Secondary | ICD-10-CM | POA: Diagnosis not present

## 2017-06-25 DIAGNOSIS — E785 Hyperlipidemia, unspecified: Secondary | ICD-10-CM | POA: Diagnosis present

## 2017-06-25 DIAGNOSIS — Z8673 Personal history of transient ischemic attack (TIA), and cerebral infarction without residual deficits: Secondary | ICD-10-CM | POA: Diagnosis not present

## 2017-06-25 DIAGNOSIS — R51 Headache: Secondary | ICD-10-CM | POA: Diagnosis not present

## 2017-06-25 DIAGNOSIS — G8929 Other chronic pain: Secondary | ICD-10-CM | POA: Diagnosis present

## 2017-06-25 DIAGNOSIS — M4800 Spinal stenosis, site unspecified: Secondary | ICD-10-CM | POA: Diagnosis present

## 2017-06-25 DIAGNOSIS — M545 Low back pain: Secondary | ICD-10-CM | POA: Diagnosis present

## 2017-06-25 DIAGNOSIS — Z7901 Long term (current) use of anticoagulants: Secondary | ICD-10-CM | POA: Diagnosis not present

## 2017-06-25 DIAGNOSIS — N400 Enlarged prostate without lower urinary tract symptoms: Secondary | ICD-10-CM | POA: Diagnosis present

## 2017-06-25 DIAGNOSIS — I480 Paroxysmal atrial fibrillation: Secondary | ICD-10-CM | POA: Diagnosis present

## 2017-06-25 DIAGNOSIS — I110 Hypertensive heart disease with heart failure: Secondary | ICD-10-CM | POA: Diagnosis present

## 2017-06-25 DIAGNOSIS — I5020 Unspecified systolic (congestive) heart failure: Secondary | ICD-10-CM | POA: Diagnosis present

## 2017-06-25 DIAGNOSIS — I471 Supraventricular tachycardia: Secondary | ICD-10-CM | POA: Diagnosis not present

## 2017-06-25 DIAGNOSIS — R0789 Other chest pain: Secondary | ICD-10-CM | POA: Diagnosis present

## 2017-06-25 DIAGNOSIS — I5022 Chronic systolic (congestive) heart failure: Secondary | ICD-10-CM | POA: Diagnosis not present

## 2017-06-25 DIAGNOSIS — Z79899 Other long term (current) drug therapy: Secondary | ICD-10-CM

## 2017-06-25 DIAGNOSIS — R002 Palpitations: Secondary | ICD-10-CM | POA: Diagnosis not present

## 2017-06-25 DIAGNOSIS — R42 Dizziness and giddiness: Secondary | ICD-10-CM | POA: Diagnosis not present

## 2017-06-25 DIAGNOSIS — I4891 Unspecified atrial fibrillation: Secondary | ICD-10-CM

## 2017-06-25 DIAGNOSIS — I10 Essential (primary) hypertension: Secondary | ICD-10-CM | POA: Diagnosis not present

## 2017-06-25 DIAGNOSIS — R000 Tachycardia, unspecified: Secondary | ICD-10-CM | POA: Diagnosis not present

## 2017-06-25 DIAGNOSIS — S0990XA Unspecified injury of head, initial encounter: Secondary | ICD-10-CM | POA: Diagnosis not present

## 2017-06-25 HISTORY — DX: Syncope and collapse: R55

## 2017-06-25 HISTORY — DX: Other persistent atrial fibrillation: I48.19

## 2017-06-25 LAB — BASIC METABOLIC PANEL
ANION GAP: 11 (ref 5–15)
BUN: 18 mg/dL (ref 6–20)
CALCIUM: 8.8 mg/dL — AB (ref 8.9–10.3)
CO2: 22 mmol/L (ref 22–32)
CREATININE: 1.17 mg/dL (ref 0.61–1.24)
Chloride: 105 mmol/L (ref 101–111)
Glucose, Bld: 96 mg/dL (ref 65–99)
Potassium: 4.6 mmol/L (ref 3.5–5.1)
SODIUM: 138 mmol/L (ref 135–145)

## 2017-06-25 LAB — CBC WITH DIFFERENTIAL/PLATELET
BASOS ABS: 0 10*3/uL (ref 0.0–0.1)
BASOS PCT: 0 %
EOS ABS: 0 10*3/uL (ref 0.0–0.7)
Eosinophils Relative: 0 %
HEMATOCRIT: 44.1 % (ref 39.0–52.0)
HEMOGLOBIN: 15.7 g/dL (ref 13.0–17.0)
Lymphocytes Relative: 24 %
Lymphs Abs: 1.8 10*3/uL (ref 0.7–4.0)
MCH: 33.6 pg (ref 26.0–34.0)
MCHC: 35.6 g/dL (ref 30.0–36.0)
MCV: 94.4 fL (ref 78.0–100.0)
MONOS PCT: 8 %
Monocytes Absolute: 0.6 10*3/uL (ref 0.1–1.0)
NEUTROS ABS: 5.1 10*3/uL (ref 1.7–7.7)
NEUTROS PCT: 68 %
Platelets: 175 10*3/uL (ref 150–400)
RBC: 4.67 MIL/uL (ref 4.22–5.81)
RDW: 12.9 % (ref 11.5–15.5)
WBC: 7.5 10*3/uL (ref 4.0–10.5)

## 2017-06-25 LAB — URINALYSIS, ROUTINE W REFLEX MICROSCOPIC
Bilirubin Urine: NEGATIVE
GLUCOSE, UA: NEGATIVE mg/dL
HGB URINE DIPSTICK: NEGATIVE
KETONES UR: 20 mg/dL — AB
LEUKOCYTES UA: NEGATIVE
Nitrite: NEGATIVE
PROTEIN: NEGATIVE mg/dL
Specific Gravity, Urine: 1.017 (ref 1.005–1.030)
pH: 5 (ref 5.0–8.0)

## 2017-06-25 LAB — I-STAT TROPONIN, ED: TROPONIN I, POC: 0.01 ng/mL (ref 0.00–0.08)

## 2017-06-25 LAB — CBG MONITORING, ED: GLUCOSE-CAPILLARY: 95 mg/dL (ref 65–99)

## 2017-06-25 MED ORDER — NITROGLYCERIN 0.4 MG SL SUBL: 0.4000 mg | SUBLINGUAL_TABLET | SUBLINGUAL | Status: DC | PRN

## 2017-06-25 MED ORDER — ONDANSETRON HCL 4 MG/2ML IJ SOLN: 4.0000 mg | Freq: Four times a day (QID) | INTRAMUSCULAR | Status: DC | PRN

## 2017-06-25 MED ORDER — SODIUM CHLORIDE 0.9 % IV SOLN
INTRAVENOUS | Status: DC
  Administered 2017-06-25 – 2017-06-27 (×2): via INTRAVENOUS

## 2017-06-25 MED ORDER — ACETAMINOPHEN 325 MG PO TABS: 650.0000 mg | ORAL_TABLET | ORAL | Status: DC | PRN

## 2017-06-25 MED ORDER — MECLIZINE HCL 25 MG PO TABS
25.0000 mg | ORAL_TABLET | Freq: Once | ORAL | Status: DC
  Filled 2017-06-25: qty 1

## 2017-06-25 MED ORDER — ASPIRIN 81 MG PO CHEW
324.0000 mg | CHEWABLE_TABLET | ORAL | Status: AC
  Administered 2017-06-25: 324 mg via ORAL
  Filled 2017-06-25: qty 4

## 2017-06-25 MED ORDER — ASPIRIN 300 MG RE SUPP: 300.0000 mg | RECTAL | Status: AC

## 2017-06-25 MED ORDER — SACUBITRIL-VALSARTAN 24-26 MG PO TABS
1.0000 | ORAL_TABLET | Freq: Two times a day (BID) | ORAL | Status: DC
  Administered 2017-06-25 – 2017-06-28 (×5): 1 via ORAL
  Filled 2017-06-25 (×6): qty 1

## 2017-06-25 MED ORDER — METOPROLOL SUCCINATE ER 100 MG PO TB24
100.0000 mg | ORAL_TABLET | Freq: Every day | ORAL | Status: DC
  Administered 2017-06-26: 100 mg via ORAL
  Filled 2017-06-25 (×2): qty 1

## 2017-06-25 MED ORDER — RIVAROXABAN 20 MG PO TABS
20.0000 mg | ORAL_TABLET | Freq: Every day | ORAL | Status: DC
  Administered 2017-06-25: 20 mg via ORAL
  Filled 2017-06-25 (×2): qty 1

## 2017-06-25 MED ORDER — DIAZEPAM 5 MG/ML IJ SOLN
2.5000 mg | Freq: Once | INTRAMUSCULAR | Status: DC
  Filled 2017-06-25: qty 2

## 2017-06-25 MED ORDER — ATORVASTATIN CALCIUM 20 MG PO TABS
20.0000 mg | ORAL_TABLET | Freq: Every evening | ORAL | Status: DC
  Administered 2017-06-25 – 2017-06-27 (×3): 20 mg via ORAL
  Filled 2017-06-25 (×3): qty 1

## 2017-06-25 MED ORDER — ASPIRIN EC 81 MG PO TBEC
81.0000 mg | DELAYED_RELEASE_TABLET | Freq: Every day | ORAL | Status: DC
  Administered 2017-06-26: 81 mg via ORAL
  Filled 2017-06-25: qty 1

## 2017-06-25 MED ORDER — SODIUM CHLORIDE 0.9 % IV BOLUS (SEPSIS)
500.0000 mL | Freq: Once | INTRAVENOUS | Status: AC
  Administered 2017-06-25: 500 mL via INTRAVENOUS

## 2017-06-25 MED ORDER — DILTIAZEM HCL 25 MG/5ML IV SOLN
10.0000 mg | Freq: Once | INTRAVENOUS | Status: AC
  Administered 2017-06-25: 10 mg via INTRAVENOUS
  Filled 2017-06-25: qty 5
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
## 2017-06-25 NOTE — ED Notes (Signed)
Patient transported to CT 
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
## 2017-06-26 ENCOUNTER — Telehealth: Payer: Self-pay | Admitting: Cardiology

## 2017-06-26 ENCOUNTER — Other Ambulatory Visit: Payer: Self-pay

## 2017-06-26 ENCOUNTER — Encounter (HOSPITAL_COMMUNITY): Payer: Self-pay | Admitting: Nurse Practitioner

## 2017-06-26 ENCOUNTER — Inpatient Hospital Stay (HOSPITAL_COMMUNITY): Payer: Medicare Other

## 2017-06-26 DIAGNOSIS — I361 Nonrheumatic tricuspid (valve) insufficiency: Secondary | ICD-10-CM

## 2017-06-26 DIAGNOSIS — I480 Paroxysmal atrial fibrillation: Principal | ICD-10-CM

## 2017-06-26 DIAGNOSIS — R55 Syncope and collapse: Secondary | ICD-10-CM

## 2017-06-26 LAB — MRSA PCR SCREENING: MRSA BY PCR: NEGATIVE

## 2017-06-26 LAB — ECHOCARDIOGRAM COMPLETE
Height: 68 in
WEIGHTICAEL: 2465.62 [oz_av]

## 2017-06-26 MED ORDER — HEPARIN (PORCINE) IN NACL 100-0.45 UNIT/ML-% IJ SOLN
1050.0000 [IU]/h | INTRAMUSCULAR | Status: DC
  Administered 2017-06-26: 1050 [IU]/h via INTRAVENOUS
  Filled 2017-06-26: qty 250
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
## 2017-06-26 NOTE — Progress Notes (Signed)
  Echocardiogram 2D Echocardiogram has been performed.  Merrie Roof F 06/26/2017, 2:08 PM

## 2017-06-26 NOTE — Telephone Encounter (Signed)
Patient wife (Happy) calling, states that patient is in the hospital and she would like to discuss his condition.

## 2017-06-26 NOTE — Progress Notes (Signed)
ANTICOAGULATION CONSULT NOTE - Initial Consult  Pharmacy Consult for heparin Indication: atrial fibrillation  No Known Allergies  Patient Measurements: Height: 5\' 8"  (172.7 cm) Weight: 154 lb 1.6 oz (69.9 kg) IBW/kg (Calculated) : 68.4  Vital Signs: Temp: 97.7 F (36.5 C) (01/29 0738) Temp Source: Oral (01/29 0738) BP: 105/75 (01/29 0956) Pulse Rate: 66 (01/29 0956)  Assessment: 72 yo M presents with palpitations and lightheadedness. Holding Xarelto as Cards taking to cath lab on 1/30. Last dose of Xarelto was 2300 last night. Will wait to start heparin until later tonight. CBC stable.  Goal of Therapy:  Heparin level 0.3-0.7 units/ml Monitor platelets by anticoagulation protocol: Yes   Plan:  No heparin bolus Start heparin gtt at 1,050 units/hr at 2100 tonight Monitor daily heparin level, CBC, s/s of bleed  Elenor Quinones, PharmD, BCPS Clinical Pharmacist Pager [phone] 06/26/2017 12:22 PM

## 2017-06-26 NOTE — Progress Notes (Signed)
Visited with patient to share AD forms. Patient said he already has forms.  I shared that he can fill those out while here in hospital usually weekdays before 3 notary and volunteers are available.  He seemed very interested in completing these forms while in the hospital.  I shared just ask for the chaplain to come and we can complete the paperwork. Billy Carpenter, Chaplain   06/26/17 1100  Clinical Encounter Type  Visited With Patient and family together  Visit Type Initial;Other (Comment) (wife and patient)  Referral From Nurse  Consult/Referral To Chaplain
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
## 2017-06-27 ENCOUNTER — Encounter (HOSPITAL_COMMUNITY)
Admission: EM | Disposition: A | Discharge status: Home / Self Care | Payer: Self-pay | Source: Home / Self Care | Attending: Cardiology

## 2017-06-27 ENCOUNTER — Encounter (HOSPITAL_COMMUNITY): Payer: Self-pay | Admitting: Interventional Cardiology

## 2017-06-27 DIAGNOSIS — I481 Persistent atrial fibrillation: Secondary | ICD-10-CM

## 2017-06-27 DIAGNOSIS — R0789 Other chest pain: Secondary | ICD-10-CM

## 2017-06-27 HISTORY — PX: LEFT HEART CATH AND CORONARY ANGIOGRAPHY: CATH118249

## 2017-06-27 LAB — CBC
HCT: 40.6 % (ref 39.0–52.0)
HEMOGLOBIN: 14.2 g/dL (ref 13.0–17.0)
MCH: 32.9 pg (ref 26.0–34.0)
MCHC: 35 g/dL (ref 30.0–36.0)
MCV: 94.2 fL (ref 78.0–100.0)
Platelets: 145 10*3/uL — ABNORMAL LOW (ref 150–400)
RBC: 4.31 MIL/uL (ref 4.22–5.81)
RDW: 12.6 % (ref 11.5–15.5)
WBC: 4.7 10*3/uL (ref 4.0–10.5)

## 2017-06-27 LAB — APTT: APTT: 98 s — AB (ref 24–36)

## 2017-06-27 LAB — HEPARIN LEVEL (UNFRACTIONATED): HEPARIN UNFRACTIONATED: 0.4 [IU]/mL (ref 0.30–0.70)

## 2017-06-27 SURGERY — LEFT HEART CATH AND CORONARY ANGIOGRAPHY
Anesthesia: LOCAL

## 2017-06-27 MED ORDER — FENTANYL CITRATE (PF) 100 MCG/2ML IJ SOLN
INTRAMUSCULAR | Status: AC
  Filled 2017-06-27: qty 2

## 2017-06-27 MED ORDER — SODIUM CHLORIDE 0.9% FLUSH
3.0000 mL | INTRAVENOUS | Status: DC | PRN
  Administered 2017-06-27: 3 mL via INTRAVENOUS
  Filled 2017-06-27: qty 3

## 2017-06-27 MED ORDER — SODIUM CHLORIDE 0.9 % WEIGHT BASED INFUSION: 3.0000 mL/kg/h | INTRAVENOUS | Status: DC

## 2017-06-27 MED ORDER — ASPIRIN 81 MG PO CHEW
81.0000 mg | CHEWABLE_TABLET | ORAL | Status: AC
  Administered 2017-06-27: 81 mg via ORAL
  Filled 2017-06-27: qty 1

## 2017-06-27 MED ORDER — CEFAZOLIN SODIUM-DEXTROSE 2-4 GM/100ML-% IV SOLN: 2.0000 g | INTRAVENOUS | Status: DC

## 2017-06-27 MED ORDER — SODIUM CHLORIDE 0.9 % WEIGHT BASED INFUSION: 1.0000 mL/kg/h | INTRAVENOUS | Status: DC

## 2017-06-27 MED ORDER — SODIUM CHLORIDE 0.9 % IV SOLN: INTRAVENOUS | Status: AC

## 2017-06-27 MED ORDER — HEPARIN SODIUM (PORCINE) 1000 UNIT/ML IJ SOLN
INTRAMUSCULAR | Status: DC | PRN
  Administered 2017-06-27: 3500 [IU] via INTRAVENOUS

## 2017-06-27 MED ORDER — SODIUM CHLORIDE 0.9% FLUSH: 3.0000 mL | Freq: Two times a day (BID) | INTRAVENOUS | Status: DC

## 2017-06-27 MED ORDER — LIDOCAINE HCL (PF) 1 % IJ SOLN
INTRAMUSCULAR | Status: AC
  Filled 2017-06-27: qty 30

## 2017-06-27 MED ORDER — VERAPAMIL HCL 2.5 MG/ML IV SOLN
INTRAVENOUS | Status: AC
  Filled 2017-06-27: qty 2

## 2017-06-27 MED ORDER — SODIUM CHLORIDE 0.9 % IV SOLN: 250.0000 mL | INTRAVENOUS | Status: DC | PRN

## 2017-06-27 MED ORDER — IOPAMIDOL (ISOVUE-370) INJECTION 76%
INTRAVENOUS | Status: DC | PRN
  Administered 2017-06-27: 60 mL via INTRA_ARTERIAL

## 2017-06-27 MED ORDER — FENTANYL CITRATE (PF) 100 MCG/2ML IJ SOLN
INTRAMUSCULAR | Status: DC | PRN
  Administered 2017-06-27: 50 ug via INTRAVENOUS

## 2017-06-27 MED ORDER — ASPIRIN 81 MG PO CHEW: 81.0000 mg | CHEWABLE_TABLET | ORAL | Status: DC

## 2017-06-27 MED ORDER — MIDAZOLAM HCL 2 MG/2ML IJ SOLN
INTRAMUSCULAR | Status: AC
  Filled 2017-06-27: qty 2

## 2017-06-27 MED ORDER — HEPARIN SODIUM (PORCINE) 1000 UNIT/ML IJ SOLN
INTRAMUSCULAR | Status: AC
  Filled 2017-06-27: qty 1

## 2017-06-27 MED ORDER — HEPARIN (PORCINE) IN NACL 2-0.9 UNIT/ML-% IJ SOLN
INTRAMUSCULAR | Status: DC | PRN
  Administered 2017-06-27: 10 mL via INTRA_ARTERIAL

## 2017-06-27 MED ORDER — OXYCODONE HCL 5 MG PO TABS: 5.0000 mg | ORAL_TABLET | ORAL | Status: DC | PRN

## 2017-06-27 MED ORDER — SODIUM CHLORIDE 0.9% FLUSH: 3.0000 mL | INTRAVENOUS | Status: DC | PRN

## 2017-06-27 MED ORDER — ONDANSETRON HCL 4 MG/2ML IJ SOLN: 4.0000 mg | Freq: Four times a day (QID) | INTRAMUSCULAR | Status: DC | PRN

## 2017-06-27 MED ORDER — SODIUM CHLORIDE 0.9% FLUSH
3.0000 mL | Freq: Two times a day (BID) | INTRAVENOUS | Status: DC
  Administered 2017-06-27: 3 mL via INTRAVENOUS

## 2017-06-27 MED ORDER — HEPARIN (PORCINE) IN NACL 2-0.9 UNIT/ML-% IJ SOLN
INTRAMUSCULAR | Status: AC
  Filled 2017-06-27: qty 1000

## 2017-06-27 MED ORDER — HEPARIN (PORCINE) IN NACL 100-0.45 UNIT/ML-% IJ SOLN
1050.0000 [IU]/h | INTRAMUSCULAR | Status: DC
  Administered 2017-06-27 (×2): 1050 [IU]/h via INTRAVENOUS
  Filled 2017-06-27: qty 250

## 2017-06-27 MED ORDER — MIDAZOLAM HCL 2 MG/2ML IJ SOLN
INTRAMUSCULAR | Status: DC | PRN
  Administered 2017-06-27: 1 mg via INTRAVENOUS

## 2017-06-27 MED ORDER — ACETAMINOPHEN 325 MG PO TABS: 650.0000 mg | ORAL_TABLET | ORAL | Status: DC | PRN

## 2017-06-27 MED ORDER — LIDOCAINE HCL (PF) 1 % IJ SOLN
INTRAMUSCULAR | Status: DC | PRN
  Administered 2017-06-27: 2 mL

## 2017-06-27 MED ORDER — HEPARIN (PORCINE) IN NACL 2-0.9 UNIT/ML-% IJ SOLN
INTRAMUSCULAR | Status: AC | PRN
  Administered 2017-06-27: 1000 mL

## 2017-06-27 MED ORDER — SODIUM CHLORIDE 0.9 % IV SOLN
250.0000 mL | INTRAVENOUS | Status: DC | PRN
Start: 2017-06-27 — End: 2017-06-28

## 2017-06-27 MED ORDER — IOPAMIDOL (ISOVUE-370) INJECTION 76%
INTRAVENOUS | Status: AC
  Filled 2017-06-27: qty 100

## 2017-06-27 SURGICAL SUPPLY — 11 items
CATH 5FR JL3.5 JR4 ANG PIG MP (CATHETERS) ×2 IMPLANT
COVER PRB 48X5XTLSCP FOLD TPE (BAG) ×1 IMPLANT
COVER PROBE 5X48 (BAG) ×1
DEVICE RAD COMP TR BAND LRG (VASCULAR PRODUCTS) ×2 IMPLANT
GLIDESHEATH SLEND A-KIT 6F 22G (SHEATH) ×2 IMPLANT
GUIDEWIRE INQWIRE 1.5J.035X260 (WIRE) ×1 IMPLANT
INQWIRE 1.5J .035X260CM (WIRE) ×2
KIT HEART LEFT (KITS) ×2 IMPLANT
PACK CARDIAC CATHETERIZATION (CUSTOM PROCEDURE TRAY) ×2 IMPLANT
TRANSDUCER W/STOPCOCK (MISCELLANEOUS) ×2 IMPLANT
TUBING CIL FLEX 10 FLL-RA (TUBING) ×2 IMPLANT

## 2017-06-27 NOTE — Progress Notes (Signed)
Took over patient care w/reported 5cc air remaining in TR band

## 2017-06-27 NOTE — Progress Notes (Signed)
Patient arrived from the cath lab, rt radial approach, to 4E room 2.  Telemetry monitor applied and CCMD notified.  Patient oriented to unit and room to include call light and phone.  Will continue to monitor.

## 2017-06-27 NOTE — Interval H&P Note (Signed)
Cath Lab Visit (complete for each Cath Lab visit)  Clinical Evaluation Leading to the Procedure:   ACS: No.  Non-ACS:    Anginal Classification: No Symptoms  Anti-ischemic medical therapy: Minimal Therapy (1 class of medications)  Non-Invasive Test Results: High-risk stress test findings: cardiac mortality >3%/year  Prior CABG: No previous CABG      History and Physical Interval Note:  06/27/2017 7:23 AM  Billy Carpenter  has presented today for surgery, with the diagnosis of syncope, cm  The various methods of treatment have been discussed with the patient and family. After consideration of risks, benefits and other options for treatment, the patient has consented to  Procedure(s): LEFT HEART CATH AND CORONARY ANGIOGRAPHY (N/A) as a surgical intervention .  The patient's history has been reviewed, patient examined, no change in status, stable for surgery.  I have reviewed the patient's chart and labs.  Questions were answered to the patient's satisfaction.     Billy Carpenter
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
## 2017-06-27 NOTE — Progress Notes (Signed)
ANTICOAGULATION CONSULT NOTE - Follow Up Consult  Pharmacy Consult for Heparin (Xarelto on hold) Indication: atrial fibrillation  No Known Allergies  Patient Measurements: Height: 5\' 8"  (322.0 cm) Weight: 156 lb 4.9 oz (70.9 kg) IBW/kg (Calculated) : 68.4  Vital Signs: Temp: 97.8 F (36.6 C) (01/30 1328) Temp Source: Oral (01/30 1328) BP: 137/84 (01/30 1328) Pulse Rate: 54 (01/30 1230)  Labs: Recent Labs    06/25/17 1624 06/27/17 0342  HGB 15.7 14.2  HCT 44.1 40.6  PLT 175 145*  APTT  --  98*  HEPARINUNFRC  --  0.40  CREATININE 1.17  --     Estimated Creatinine Clearance: 54.4 mL/min (by C-G formula based on SCr of 1.17 mg/dL).   Assessment: Holding Xarelto and using heparin in anticipation of cath today to assess for CAD before deciding on ICD implantation.   Heparin level at goal this morning, which was stopped prior to cath. New orders received to start heparin once TR band removed. Sheath removed at 11am. TR band off at 1330. Will start heparin back at 1900.  Goal of Therapy:  Heparin level 0.3-0.7 units/ml Monitor platelets by anticoagulation protocol: Yes   Plan:  Restart heparin at 1050 units/hr tonight Daily heparin level and cbc  Erin Hearing PharmD., BCPS Clinical Pharmacist 06/27/2017 1:51 PM

## 2017-06-27 NOTE — Progress Notes (Signed)
ANTICOAGULATION CONSULT NOTE - Follow Up Consult  Pharmacy Consult for Heparin (Xarelto on hold) Indication: atrial fibrillation  No Known Allergies  Patient Measurements: Height: 5\' 8"  (172.7 cm) Weight: 156 lb 4.9 oz (70.9 kg) IBW/kg (Calculated) : 68.4  Vital Signs: Temp: 97.8 F (36.6 C) (01/30 0300) Temp Source: Oral (01/30 0300) BP: 97/77 (01/30 0300) Pulse Rate: 89 (01/29 2325)  Labs: Recent Labs    06/25/17 1624 06/27/17 0342  HGB 15.7 14.2  HCT 44.1 40.6  PLT 175 145*  APTT  --  98*  HEPARINUNFRC  --  0.40  CREATININE 1.17  --     Estimated Creatinine Clearance: 54.4 mL/min (by C-G formula based on SCr of 1.17 mg/dL).   Assessment: Holding Xarelto and using heparin in anticipation of cath today to assess for CAD before deciding on ICD implantation. Heparin level is therapeutic x 1 at 0.4.   Goal of Therapy:  Heparin level 0.3-0.7 units/ml Monitor platelets by anticoagulation protocol: Yes   Plan:  Cont heparin at 1050 units/hr 1200 heparin level   Narda Bonds 06/27/2017,5:58 AM

## 2017-06-28 ENCOUNTER — Encounter (HOSPITAL_COMMUNITY): Payer: Self-pay

## 2017-06-28 ENCOUNTER — Emergency Department (HOSPITAL_COMMUNITY): Payer: Medicare Other

## 2017-06-28 ENCOUNTER — Emergency Department (HOSPITAL_COMMUNITY)
Admission: EM | Admit: 2017-06-28 | Discharge: 2017-06-28 | Disposition: A | Discharge status: Home / Self Care | Payer: Medicare Other | Attending: Emergency Medicine | Admitting: Emergency Medicine

## 2017-06-28 ENCOUNTER — Encounter (HOSPITAL_COMMUNITY)
Admission: EM | Disposition: A | Discharge status: Home / Self Care | Payer: Self-pay | Source: Home / Self Care | Attending: Cardiology

## 2017-06-28 ENCOUNTER — Other Ambulatory Visit: Payer: Self-pay | Admitting: Physician Assistant

## 2017-06-28 DIAGNOSIS — Z7901 Long term (current) use of anticoagulants: Secondary | ICD-10-CM | POA: Diagnosis not present

## 2017-06-28 DIAGNOSIS — Z79899 Other long term (current) drug therapy: Secondary | ICD-10-CM | POA: Diagnosis not present

## 2017-06-28 DIAGNOSIS — I480 Paroxysmal atrial fibrillation: Secondary | ICD-10-CM

## 2017-06-28 DIAGNOSIS — Z8673 Personal history of transient ischemic attack (TIA), and cerebral infarction without residual deficits: Secondary | ICD-10-CM | POA: Insufficient documentation

## 2017-06-28 DIAGNOSIS — R000 Tachycardia, unspecified: Secondary | ICD-10-CM | POA: Insufficient documentation

## 2017-06-28 DIAGNOSIS — R002 Palpitations: Secondary | ICD-10-CM | POA: Diagnosis not present

## 2017-06-28 DIAGNOSIS — Z5181 Encounter for therapeutic drug level monitoring: Secondary | ICD-10-CM

## 2017-06-28 DIAGNOSIS — I10 Essential (primary) hypertension: Secondary | ICD-10-CM | POA: Insufficient documentation

## 2017-06-28 LAB — BASIC METABOLIC PANEL
ANION GAP: 11 (ref 5–15)
BUN: 12 mg/dL (ref 6–20)
CALCIUM: 9.1 mg/dL (ref 8.9–10.3)
CO2: 20 mmol/L — ABNORMAL LOW (ref 22–32)
CREATININE: 1.1 mg/dL (ref 0.61–1.24)
Chloride: 107 mmol/L (ref 101–111)
Glucose, Bld: 117 mg/dL — ABNORMAL HIGH (ref 65–99)
Potassium: 3.6 mmol/L (ref 3.5–5.1)
SODIUM: 138 mmol/L (ref 135–145)

## 2017-06-28 LAB — HEPARIN LEVEL (UNFRACTIONATED): HEPARIN UNFRACTIONATED: 0.38 [IU]/mL (ref 0.30–0.70)

## 2017-06-28 LAB — I-STAT TROPONIN, ED: TROPONIN I, POC: 0.02 ng/mL (ref 0.00–0.08)

## 2017-06-28 LAB — CBC
HCT: 40.9 % (ref 39.0–52.0)
HCT: 42.6 % (ref 39.0–52.0)
HEMOGLOBIN: 15.6 g/dL (ref 13.0–17.0)
Hemoglobin: 14.3 g/dL (ref 13.0–17.0)
MCH: 32.6 pg (ref 26.0–34.0)
MCH: 33.6 pg (ref 26.0–34.0)
MCHC: 35 g/dL (ref 30.0–36.0)
MCHC: 36.6 g/dL — ABNORMAL HIGH (ref 30.0–36.0)
MCV: 93.4 fL (ref 78.0–100.0)
MCV: 91.8 fL (ref 78.0–100.0)
PLATELETS: 138 10*3/uL — AB (ref 150–400)
PLATELETS: 161 10*3/uL (ref 150–400)
RBC: 4.38 MIL/uL (ref 4.22–5.81)
RBC: 4.64 MIL/uL (ref 4.22–5.81)
RDW: 12.4 % (ref 11.5–15.5)
RDW: 12.4 % (ref 11.5–15.5)
WBC: 6.8 10*3/uL (ref 4.0–10.5)
WBC: 5.6 10*3/uL (ref 4.0–10.5)

## 2017-06-28 SURGERY — LOOP RECORDER INSERTION

## 2017-06-28 MED ORDER — FLECAINIDE ACETATE 100 MG PO TABS
100.0000 mg | ORAL_TABLET | Freq: Once | ORAL | Status: AC
  Administered 2017-06-28: 100 mg via ORAL
  Filled 2017-06-28: qty 1

## 2017-06-28 MED ORDER — CARVEDILOL 6.25 MG PO TABS: 6.2500 mg | ORAL_TABLET | Freq: Two times a day (BID) | ORAL | 11 refills | Status: DC

## 2017-06-28 MED ORDER — FLECAINIDE ACETATE 100 MG PO TABS: 100.0000 mg | ORAL_TABLET | Freq: Two times a day (BID) | ORAL | 5 refills | Status: DC

## 2017-06-28 NOTE — ED Triage Notes (Signed)
PT bib gcems from home. Pt was discharged today from the hospital with afib and was prescribed new medication but hasn't taken it. Pt had gotten home and was eating when pt felt palpitations and sob. Ems arrived pt was in afib with a HR of 120 and pt then dropped back into sinus rhythm as of right now. Pt denise chest pain , N/V/D.

## 2017-06-28 NOTE — Progress Notes (Signed)
ANTICOAGULATION CONSULT NOTE - Follow Up Consult  Pharmacy Consult for Heparin (Xarelto on hold) Indication: atrial fibrillation  No Known Allergies  Patient Measurements: Height: 5\' 8"  (172.7 cm) Weight: 154 lb 1.6 oz (69.9 kg) IBW/kg (Calculated) : 68.4  Vital Signs: Temp: 97.7 F (36.5 C) (01/31 0520) Temp Source: Oral (01/31 0520) BP: 128/85 (01/31 0520) Pulse Rate: 83 (01/31 0520)  Labs: Recent Labs    06/25/17 1624 06/27/17 0342 06/28/17 0335  HGB 15.7 14.2 14.3  HCT 44.1 40.6 40.9  PLT 175 145* 138*  APTT  --  98*  --   HEPARINUNFRC  --  0.40 0.38  CREATININE 1.17  --   --     Estimated Creatinine Clearance: 54.4 mL/min (by C-G formula based on SCr of 1.17 mg/dL).   Assessment:  Heparin at goal this am. Planning to restart xarelto today at discharge. No plans for any further procedures.  Goal of Therapy:  Heparin level 0.3-0.7 units/ml Monitor platelets by anticoagulation protocol: Yes   Plan:  Resume xarelto 20mg  daily at d/c  Erin Hearing PharmD., BCPS Clinical Pharmacist 06/28/2017 10:37 AM
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
## 2017-06-28 NOTE — Progress Notes (Signed)
Responded to page to assist patient with completing AD.  AD was completed and notarize and copies given to nurse for patients chart and for chosen agents.  Patient being discharged.    06/28/17 1108  Clinical Encounter Type  Visited With Patient and family together;Health care provider  Visit Type Initial;Spiritual support  Referral From Nurse  Spiritual Encounters  Spiritual Needs Brochure;Emotional  Stress Factors  Patient Stress Factors None identified  Advance Directives (For Healthcare)  Does Patient Have a Medical Advance Directive? No  Does patient want to make changes to medical advance directive? Yes (Inpatient - patient requests chaplain consult to change a medical advance directive)  Type of Advance Directive [place];Living will  Copy of [place] in Chart? Yes  Copy of Living Will in Chart? Yes  Cristopher [place], [facility], Pager [phone]

## 2017-06-28 NOTE — Consult Note (Signed)
           [facility] Primary Care Navigator  06/28/2017  Billy Carpenter 10/22/1945 [id number]   Wenttoseepatient at the bedsideto identify possible discharge needs but he was alreadydischargedper RN report.   PerMD note, patient was seen for evaluation of Atrial fibrillation with RVR (Rapid Ventricular Response) with accompanied palpitation and lightheadedness while golfing. He underwent cardiac catheterization on 06/27/2017.  Patient was discharged homethis morning.  Primary care provider's officeis listed asprovidingtransition of care (TOC).   For questions, please contact:  Dannielle Huh, BSN, RN- [facility] Primary Care Navigator  Telephone: [phone] [place]

## 2017-06-28 NOTE — Discharge Instructions (Signed)
Return to the ED with any concerns including difficulty breathing, chest pain, fainting, decreased level of alertness/lethargy, or any other alarming symptoms °

## 2017-06-28 NOTE — ED Provider Notes (Signed)
[facility] EMERGENCY DEPARTMENT Provider Note   CSN: [id number] Arrival date & time: 06/28/17  1851     History   Chief Complaint Chief Complaint  Patient presents with  . Atrial Fibrillation    HPI Billy Carpenter is a 72 y.o. male.  HPI  Patient with history of atrial fib flutter on Xarelto presenting after episode of palpitations with rapid heart rate.  He was admitted to the hospital 3 days ago and was discharged earlier today.  His atrial fibrillation had resolved during hospital stay.  His medications were changed to Coreg and plan was to start him on flecainide.  Patient opted not for link recorder.  He wanted to try flecainide prior to ablation.  After being discharged today he went home took a shower was eating dinner and then felt fast heart rate.  He did not syncopized but wife states he was leaning over and appeared to be sweating.  He had no difficulty breathing.  He had no chest pain.  On EMS arrival his heart rate was 120.  On arrival to the ED he is in sinus rhythm with heart rate in the 70s and 80s.  He currently feels back to his baseline.  They had not had a chance to pick up his prescription for flecainide or start on this medication yet.  There are no other associated systemic symptoms, there are no other alleviating or modifying factors.   Past Medical History:  Diagnosis Date  . BPH (benign prostatic hyperplasia)   . Carotid artery disease ([place])    Left internal carotid artery spontaneous dissection in the past  //   Doppler, October, 2013, minimal plaque, 0-39% bilateral  . Chronic low back pain 05/03/2017  . CVA (cerebral infarction)    left insular cortex stroke with left internal carotid artery dissection and near occlusion  . Dyslipidemia   . Hematuria   . Hypertension   . Mitral regurgitation    mild, echo, March, 2010  . Persistent atrial fibrillation ([place])   . Syncope     Patient Active Problem List   Diagnosis Date Noted  .  Paroxysmal A-fib ([place]) 06/25/2017  . HFrEF (heart failure with reduced ejection fraction) ([place]) 06/05/2017  . Chronic low back pain 05/03/2017  . History of carotid artery dissection 04/12/2016  . Carotid artery disease ([place])   . Supraventricular tachycardia ([place])   . Dyslipidemia   . Hypertension   . CVA (cerebral infarction)   . BPH (benign prostatic hyperplasia)   . Hematuria   . Postural dizziness with near syncope   . Ejection fraction   . Mitral regurgitation   . Chest discomfort     Past Surgical History:  Procedure Laterality Date  . BLADDER STONE REMOVAL    . LEFT HEART CATH AND CORONARY ANGIOGRAPHY N/A 06/27/2017   Procedure: LEFT HEART CATH AND CORONARY ANGIOGRAPHY;  Surgeon: Belva Crome, MD;  Location: [facility];  Service: Cardiovascular;  Laterality: N/A;  . PROSTATE SURGERY  2009  . WRIST SURGERY     Right       Home Medications    Prior to Admission medications   Medication Sig Start Date End Date Taking? Authorizing Provider  atorvastatin (LIPITOR) 20 MG tablet Take 1 tablet (20 mg total) by mouth daily. Patient taking differently: Take 20 mg by mouth at bedtime.  06/20/16   Nahser, Wonda Cheng, MD  carvedilol (COREG) 6.25 MG tablet Take 1 tablet (6.25 mg total) by  mouth 2 (two) times daily. 06/28/17 06/28/18  Almyra Deforest, PA  flecainide (TAMBOCOR) 100 MG tablet Take 1 tablet (100 mg total) by mouth 2 (two) times daily. 06/28/17   Almyra Deforest, PA  rivaroxaban (XARELTO) 20 MG TABS tablet Take 1 tablet (20 mg total) by mouth daily with supper. 02/14/17   Sherran Needs, NP  sacubitril-valsartan (ENTRESTO) 24-26 MG Take 1 tablet by mouth 2 (two) times daily. 05/11/17   Constance Haw, MD    Family History Family History  Problem Relation Age of Onset  . Arrhythmia Mother   . Hypertension Mother   . Arthritis Mother   . Other Father        tumor  . Prostate cancer Father   . CVA Neg Hx     Social History Social History   Tobacco Use  .  Smoking status: Never Smoker  . Smokeless tobacco: Never Used  Substance Use Topics  . Alcohol use: Yes  . Drug use: No     Allergies   Patient has no known allergies.   Review of Systems Review of Systems  ROS reviewed and all otherwise negative except for mentioned in HPI   Physical Exam Updated Vital Signs BP 116/89   Pulse 67   Resp (!) 25   Ht 5\' 8"  (1.727 m)   Wt 69.8 kg (153 lb 12.8 oz)   SpO2 98%   BMI 23.39 kg/m  Vitals reviewed Physical Exam  Physical Examination: General appearance - alert, well appearing, and in no distress Mental status - alert, oriented to person, place, and time Eyes - no conjunctival injection, no scleral icterus Mouth - mucous membranes moist, pharynx normal without lesions Neck - supple, no significant adenopathy Chest - clear to auscultation, no wheezes, rales or rhonchi, symmetric air entry Heart - normal rate, regular rhythm, normal S1, S2, no murmurs, rubs, clicks or gallops Abdomen - soft, nontender, nondistended, no masses or organomegaly Neurological - alert, oriented, normal speech,  Extremities - peripheral pulses normal, no pedal edema, no clubbing or cyanosis Skin - normal coloration and turgor, no rashes,    ED Treatments / Results  Labs (all labs ordered are listed, but only abnormal results are displayed) Labs Reviewed  BASIC METABOLIC PANEL - Abnormal; Notable for the following components:      Result Value   CO2 20 (*)    Glucose, Bld 117 (*)    All other components within normal limits  CBC - Abnormal; Notable for the following components:   MCHC 36.6 (*)    All other components within normal limits  I-STAT TROPONIN, ED    EKG  EKG Interpretation  Date/Time:  Thursday June 28 2017 18:57:44 EST Ventricular Rate:  86 PR Interval:    QRS Duration: 90 QT Interval:  381 QTC Calculation: 456 R Axis:   -27 Text Interpretation:  Sinus rhythm Borderline left axis deviation Borderline T abnormalities,  inferior leads atiral fibrillation is no longer present Confirmed by Alfonzo Beers [phone]) on 06/28/2017 7:14:44 PM       Radiology Dg Chest 2 View  Result Date: 06/28/2017 CLINICAL DATA:  Palpitations and shortness-of-breath. EXAM: CHEST  2 VIEW COMPARISON:  09/17/2015 FINDINGS: Lungs are adequately inflated and otherwise clear. Cardiomediastinal silhouette and remainder the exam is unchanged. IMPRESSION: No active cardiopulmonary disease. Electronically Signed   By: Marin Olp M.D.   On: 06/28/2017 19:27    Procedures Procedures (including critical care time)  Medications Ordered in ED Medications  flecainide (  TAMBOCOR) tablet 100 mg (100 mg Oral Given 06/28/17 2216)     Initial Impression / Assessment and Plan / ED Course  I have reviewed the triage vital signs and the nursing notes.  Pertinent labs & imaging results that were available during my care of the patient were reviewed by me and considered in my medical decision making (see chart for details).    10:08 PM discussed with cardiology on-call who recommends patient to continue the plan as outlined at his discharge which was to start on flecainide.  She states there is no indication for admission at this time.  Patient is reassured and has been given first dose of flecainide tonight.  He has a prescription waiting for him to pick up at the pharmacy tomorrow.    Final Clinical Impressions(s) / ED Diagnoses   Final diagnoses:  Paroxysmal atrial fibrillation [facility])    ED Discharge Orders    None       Marcha Dutton Forbes Cellar, MD 06/28/17 2350

## 2017-06-28 NOTE — Progress Notes (Signed)
Order received to discharge patient.  Telemetry monitor removed and CCMD notified.  PIV access removed.  Discharge instructions, follow up, medications and instructions for their use discussed with patient. 

## 2017-06-28 NOTE — Progress Notes (Signed)
Progress Note  Patient Name: Billy Carpenter Date of Encounter: 06/28/2017  Primary Cardiologist: No primary care provider on file.   Subjective   Continues to feel well.  No further episodes of palpitations, chest pain, or near syncope.  In discussion with the patient, he does not wish to have a Linq monitor implanted nor does he wish to have a pacemaker implanted.  He would prefer to have further therapy for atrial fibrillation with medical management.  Inpatient Medications    Scheduled Meds: . atorvastatin  20 mg Oral QPM  . meclizine  25 mg Oral Once  . sacubitril-valsartan  1 tablet Oral BID  . sodium chloride flush  3 mL Intravenous Q12H   Continuous Infusions: . sodium chloride    . heparin 1,050 Units/hr (06/28/17 0300)   PRN Meds: sodium chloride, acetaminophen, nitroGLYCERIN, ondansetron (ZOFRAN) IV, oxyCODONE, sodium chloride flush   Vital Signs    Vitals:   06/27/17 1230 06/27/17 1328 06/27/17 2035 06/28/17 0520  BP: 119/86 137/84 129/85 128/85  Pulse: (!) 54   83  Resp: (!) 22  17 16   Temp:  97.8 F (36.6 C) 98.2 F (36.8 C) 97.7 F (36.5 C)  TempSrc:  Oral Oral Oral  SpO2: 98% 97% 96% 96%  Weight:  154 lb 1.6 oz (69.9 kg)    Height:  5\' 8"  (1.727 m)      Intake/Output Summary (Last 24 hours) at 06/28/2017 0819 Last data filed at 06/28/2017 0300 Gross per 24 hour  Intake 686.45 ml  Output 2550 ml  Net -1863.55 ml   Filed Weights   06/26/17 0517 06/27/17 0550 06/27/17 1328  Weight: 154 lb 1.6 oz (69.9 kg) 156 lb 4.9 oz (70.9 kg) 154 lb 1.6 oz (69.9 kg)    Telemetry    Sinus rhythm- Personally Reviewed  ECG    None new - Personally Reviewed  Physical Exam   GEN: Well nourished, well developed, in no acute distress  HEENT: normal  Neck: no JVD, carotid bruits, or masses Cardiac: RRR; no murmurs, rubs, or gallops,no edema  Respiratory:  clear to auscultation bilaterally, normal work of breathing GI: soft, nontender, nondistended, +  BS MS: no deformity or atrophy  Skin: warm and dry Neuro:  Strength and sensation are intact Psych: euthymic mood, full affect   Labs    Chemistry Recent Labs  Lab 06/25/17 1624  NA 138  K 4.6  CL 105  CO2 22  GLUCOSE 96  BUN 18  CREATININE 1.17  CALCIUM 8.8*  GFRNONAA >60  GFRAA >60  ANIONGAP 11     Hematology Recent Labs  Lab 06/25/17 1624 06/27/17 0342 06/28/17 0335  WBC 7.5 4.7 6.8  RBC 4.67 4.31 4.38  HGB 15.7 14.2 14.3  HCT 44.1 40.6 40.9  MCV 94.4 94.2 93.4  MCH 33.6 32.9 32.6  MCHC 35.6 35.0 35.0  RDW 12.9 12.6 12.4  PLT 175 145* 138*    Cardiac EnzymesNo results for input(s): TROPONINI in the last 168 hours.  Recent Labs  Lab 06/25/17 1639  TROPIPOC 0.01     BNPNo results for input(s): BNP, PROBNP in the last 168 hours.   DDimer No results for input(s): DDIMER in the last 168 hours.   Radiology    No results found.  Cardiac Studies   TTE - Left ventricle: The cavity size was normal. Wall thickness was   increased in a pattern of mild LVH. Systolic function was mildly   reduced. The estimated ejection  fraction was in the range of 45%   to 50%. Wall motion was normal; there were no regional wall   motion abnormalities. Doppler parameters are consistent with   abnormal left ventricular relaxation (grade 1 diastolic   dysfunction). - Aortic valve: Transvalvular velocity was within the normal range.   There was no stenosis. There was mild regurgitation. - Mitral valve: Transvalvular velocity was within the normal range.   There was no evidence for stenosis. There was mild regurgitation. - Left atrium: The atrium was severely dilated. - Right ventricle: The cavity size was mildly dilated. Wall   thickness was normal. Systolic function was normal. - Right atrium: The atrium was moderately dilated. - Atrial septum: No defect or patent foramen ovale was identified   by color flow Doppler. - Tricuspid valve: There was mild regurgitation. -  Pulmonary arteries: Systolic pressure was within the normal   range. PA peak pressure: 25 mm Hg (S).  Patient Profile     72 y.o. male with past history of persistent atrial fibrillation, nonischemic cardiomyopathy, prior carotid dissection and CVA who presented to the hospital with an episode of syncope.  Assessment & Plan    1.  Syncope Is an echo shows an ejection fraction 45-50%.  Catheterization shows no evidence of coronary artery disease.  It is likely that his episode of syncope was related to atrial fibrillation.  I did offer link monitoring, but the patient has since declined and does not wish to have a pacemaker implanted at this time.  We Billy Carpenter plan for further therapy with atrial fibrillation.  I did discuss with him no driving per [facility] law for 6 months.  2.  Persistent atrial fibrillation Increasing in frequency and severity.  Would plan to start flecainide 100 mg twice daily, and Coreg 6.25 mg twice daily.  Would restart his home dose of Xarelto.  He may wish to have ablation in the future.  This patients CHA2DS2-VASc Score and unadjusted Ischemic Stroke Rate (% per year) is equal to 2.2 % stroke rate/year from a score of 2  Above score calculated as 1 point each if present [CHF, HTN, DM, Vascular=MI/PAD/Aortic Plaque, Age if 65-74, or Male] Above score calculated as 2 points each if present [Age > 75, or Stroke/TIA/TE]  3.  LV dysfunction Ejection fraction has since improved.  Restarting Coreg and currently on Entresto.  4.  Prior CVA Occurred in the setting of carotid dissection  Plan for discharge today with follow-up in EP clinic in 3 months.  For questions or updates, please contact [place] Please consult [email or web address] for contact info under Cardiology/STEMI.      Signed, Carlitos Bottino Meredith Leeds, MD  06/28/2017, 8:19 AM

## 2017-07-02 DIAGNOSIS — I639 Cerebral infarction, unspecified: Secondary | ICD-10-CM | POA: Diagnosis not present

## 2017-07-02 DIAGNOSIS — M1990 Unspecified osteoarthritis, unspecified site: Secondary | ICD-10-CM | POA: Diagnosis not present

## 2017-07-03 DIAGNOSIS — R7301 Impaired fasting glucose: Secondary | ICD-10-CM | POA: Diagnosis not present

## 2017-07-03 DIAGNOSIS — I4891 Unspecified atrial fibrillation: Secondary | ICD-10-CM | POA: Diagnosis not present

## 2017-07-03 DIAGNOSIS — I10 Essential (primary) hypertension: Secondary | ICD-10-CM | POA: Diagnosis not present

## 2017-07-04 DIAGNOSIS — I499 Cardiac arrhythmia, unspecified: Secondary | ICD-10-CM | POA: Insufficient documentation

## 2017-07-04 DIAGNOSIS — Z87898 Personal history of other specified conditions: Secondary | ICD-10-CM | POA: Insufficient documentation

## 2017-07-04 DIAGNOSIS — E782 Mixed hyperlipidemia: Secondary | ICD-10-CM | POA: Insufficient documentation

## 2017-07-05 ENCOUNTER — Ambulatory Visit (INDEPENDENT_AMBULATORY_CARE_PROVIDER_SITE_OTHER): Payer: Medicare Other

## 2017-07-05 DIAGNOSIS — Z79899 Other long term (current) drug therapy: Secondary | ICD-10-CM

## 2017-07-05 DIAGNOSIS — Z5181 Encounter for therapeutic drug level monitoring: Secondary | ICD-10-CM

## 2017-07-06 ENCOUNTER — Telehealth: Payer: Self-pay | Admitting: Cardiology

## 2017-07-06 ENCOUNTER — Ambulatory Visit (HOSPITAL_COMMUNITY)
Admission: RE | Admit: 2017-07-06 | Discharge: 2017-07-06 | Disposition: A | Discharge status: Home / Self Care | Payer: Medicare Other | Source: Ambulatory Visit | Attending: Nurse Practitioner | Admitting: Nurse Practitioner

## 2017-07-06 ENCOUNTER — Other Ambulatory Visit: Payer: Self-pay

## 2017-07-06 VITALS — BP 118/84 | HR 71 | Ht 68.0 in | Wt 157.2 lb

## 2017-07-06 DIAGNOSIS — I480 Paroxysmal atrial fibrillation: Secondary | ICD-10-CM | POA: Diagnosis not present

## 2017-07-06 DIAGNOSIS — Z8673 Personal history of transient ischemic attack (TIA), and cerebral infarction without residual deficits: Secondary | ICD-10-CM | POA: Insufficient documentation

## 2017-07-06 DIAGNOSIS — Z7901 Long term (current) use of anticoagulants: Secondary | ICD-10-CM | POA: Insufficient documentation

## 2017-07-06 DIAGNOSIS — I10 Essential (primary) hypertension: Secondary | ICD-10-CM | POA: Insufficient documentation

## 2017-07-06 DIAGNOSIS — I4891 Unspecified atrial fibrillation: Secondary | ICD-10-CM | POA: Diagnosis present

## 2017-07-06 DIAGNOSIS — N400 Enlarged prostate without lower urinary tract symptoms: Secondary | ICD-10-CM | POA: Insufficient documentation

## 2017-07-06 DIAGNOSIS — I481 Persistent atrial fibrillation: Secondary | ICD-10-CM | POA: Insufficient documentation

## 2017-07-06 DIAGNOSIS — E785 Hyperlipidemia, unspecified: Secondary | ICD-10-CM | POA: Insufficient documentation

## 2017-07-06 DIAGNOSIS — Z79899 Other long term (current) drug therapy: Secondary | ICD-10-CM | POA: Insufficient documentation

## 2017-07-06 LAB — EXERCISE TOLERANCE TEST
CSEPED: 8 min
CSEPEW: 10.1 METS
CSEPPHR: 116 {beats}/min
Exercise duration (sec): 0 s
MPHR: 147 {beats}/min
Percent HR: 78 %
RPE: 17
Rest HR: 70 {beats}/min

## 2017-07-06 NOTE — Telephone Encounter (Signed)
Walk In pt Form-pt dropped off [employer] papers for [facility] to complete. Placed in his doc Box.
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
## 2017-07-09 DIAGNOSIS — M9903 Segmental and somatic dysfunction of lumbar region: Secondary | ICD-10-CM | POA: Diagnosis not present

## 2017-07-09 DIAGNOSIS — M5441 Lumbago with sciatica, right side: Secondary | ICD-10-CM | POA: Diagnosis not present

## 2017-07-09 LAB — FLECAINIDE LEVEL: FLECAINIDE: 0.2 ug/mL (ref 0.20–1.00)

## 2017-07-10 ENCOUNTER — Encounter (INDEPENDENT_AMBULATORY_CARE_PROVIDER_SITE_OTHER): Payer: Self-pay

## 2017-07-10 DIAGNOSIS — R55 Syncope and collapse: Secondary | ICD-10-CM | POA: Diagnosis not present

## 2017-07-10 DIAGNOSIS — I4891 Unspecified atrial fibrillation: Secondary | ICD-10-CM | POA: Diagnosis not present

## 2017-07-10 DIAGNOSIS — Z7901 Long term (current) use of anticoagulants: Secondary | ICD-10-CM | POA: Diagnosis not present

## 2017-07-10 DIAGNOSIS — E785 Hyperlipidemia, unspecified: Secondary | ICD-10-CM | POA: Diagnosis not present

## 2017-07-11 ENCOUNTER — Other Ambulatory Visit: Payer: Self-pay | Admitting: Family Medicine

## 2017-07-11 ENCOUNTER — Other Ambulatory Visit (HOSPITAL_COMMUNITY): Payer: Self-pay | Admitting: Nurse Practitioner

## 2017-07-11 DIAGNOSIS — R55 Syncope and collapse: Secondary | ICD-10-CM

## 2017-07-11 DIAGNOSIS — M5441 Lumbago with sciatica, right side: Secondary | ICD-10-CM | POA: Diagnosis not present

## 2017-07-11 DIAGNOSIS — M9903 Segmental and somatic dysfunction of lumbar region: Secondary | ICD-10-CM | POA: Diagnosis not present

## 2017-07-16 ENCOUNTER — Ambulatory Visit
Admission: RE | Admit: 2017-07-16 | Discharge: 2017-07-16 | Disposition: A | Discharge status: Home / Self Care | Payer: Medicare Other | Source: Ambulatory Visit | Attending: Family Medicine | Admitting: Family Medicine

## 2017-07-16 DIAGNOSIS — R55 Syncope and collapse: Secondary | ICD-10-CM

## 2017-07-16 DIAGNOSIS — M5441 Lumbago with sciatica, right side: Secondary | ICD-10-CM | POA: Diagnosis not present

## 2017-07-16 DIAGNOSIS — M9903 Segmental and somatic dysfunction of lumbar region: Secondary | ICD-10-CM | POA: Diagnosis not present

## 2017-07-18 DIAGNOSIS — M48061 Spinal stenosis, lumbar region without neurogenic claudication: Secondary | ICD-10-CM | POA: Insufficient documentation

## 2017-07-18 DIAGNOSIS — M4310 Spondylolisthesis, site unspecified: Secondary | ICD-10-CM | POA: Diagnosis not present

## 2017-07-19 DIAGNOSIS — M5441 Lumbago with sciatica, right side: Secondary | ICD-10-CM | POA: Diagnosis not present

## 2017-07-19 DIAGNOSIS — M9903 Segmental and somatic dysfunction of lumbar region: Secondary | ICD-10-CM | POA: Diagnosis not present

## 2017-07-20 DIAGNOSIS — I4891 Unspecified atrial fibrillation: Secondary | ICD-10-CM | POA: Diagnosis not present

## 2017-07-20 DIAGNOSIS — M1288 Other specific arthropathies, not elsewhere classified, other specified site: Secondary | ICD-10-CM | POA: Diagnosis not present

## 2017-07-20 DIAGNOSIS — M4800 Spinal stenosis, site unspecified: Secondary | ICD-10-CM | POA: Diagnosis not present

## 2017-07-20 DIAGNOSIS — I639 Cerebral infarction, unspecified: Secondary | ICD-10-CM | POA: Diagnosis not present

## 2017-07-23 DIAGNOSIS — M5441 Lumbago with sciatica, right side: Secondary | ICD-10-CM | POA: Diagnosis not present

## 2017-07-23 DIAGNOSIS — M9903 Segmental and somatic dysfunction of lumbar region: Secondary | ICD-10-CM | POA: Diagnosis not present

## 2017-07-24 ENCOUNTER — Other Ambulatory Visit: Payer: Self-pay | Admitting: Family Medicine

## 2017-07-24 DIAGNOSIS — G8929 Other chronic pain: Secondary | ICD-10-CM

## 2017-07-24 DIAGNOSIS — M545 Low back pain: Principal | ICD-10-CM

## 2017-07-25 ENCOUNTER — Encounter: Payer: Self-pay | Admitting: Cardiology

## 2017-07-25 DIAGNOSIS — M5441 Lumbago with sciatica, right side: Secondary | ICD-10-CM | POA: Diagnosis not present

## 2017-07-25 DIAGNOSIS — M9903 Segmental and somatic dysfunction of lumbar region: Secondary | ICD-10-CM | POA: Diagnosis not present

## 2017-07-26 DIAGNOSIS — I480 Paroxysmal atrial fibrillation: Secondary | ICD-10-CM | POA: Diagnosis not present

## 2017-07-26 DIAGNOSIS — R55 Syncope and collapse: Secondary | ICD-10-CM | POA: Diagnosis not present

## 2017-07-26 DIAGNOSIS — I471 Supraventricular tachycardia: Secondary | ICD-10-CM | POA: Diagnosis not present

## 2017-08-01 ENCOUNTER — Ambulatory Visit: Payer: Medicare Other | Admitting: Cardiology

## 2017-08-02 ENCOUNTER — Ambulatory Visit
Admission: RE | Admit: 2017-08-02 | Discharge: 2017-08-02 | Disposition: A | Discharge status: Home / Self Care | Payer: Medicare Other | Source: Ambulatory Visit | Attending: Family Medicine | Admitting: Family Medicine

## 2017-08-02 ENCOUNTER — Other Ambulatory Visit: Payer: Self-pay | Admitting: Family Medicine

## 2017-08-02 DIAGNOSIS — I480 Paroxysmal atrial fibrillation: Secondary | ICD-10-CM | POA: Diagnosis not present

## 2017-08-02 DIAGNOSIS — M545 Low back pain: Principal | ICD-10-CM

## 2017-08-02 DIAGNOSIS — G8929 Other chronic pain: Secondary | ICD-10-CM

## 2017-08-02 DIAGNOSIS — M47817 Spondylosis without myelopathy or radiculopathy, lumbosacral region: Secondary | ICD-10-CM | POA: Diagnosis not present

## 2017-08-02 MED ORDER — IOPAMIDOL (ISOVUE-M 200) INJECTION 41%
1.0000 mL | Freq: Once | INTRAMUSCULAR | Status: AC
  Administered 2017-08-02: 1 mL via INTRA_ARTICULAR

## 2017-08-02 MED ORDER — METHYLPREDNISOLONE ACETATE 40 MG/ML INJ SUSP (RADIOLOG
120.0000 mg | Freq: Once | INTRAMUSCULAR | Status: AC
  Administered 2017-08-02: 120 mg via INTRA_ARTICULAR

## 2017-08-02 NOTE — Discharge Instructions (Signed)
Joint Injection Discharge Instructions ° °1. After your joint injection, use ice to the affected area for the next 24 hours as a temporary increase in pain is not uncommon for a day or two after your procedure. ° °2. Resume all medications unless otherwise instructed. ° °3. Common side effects of steroids include facial flushing or redness, restlessness or inability to sleep and an increase in your blood sugar if you are a diabetic.   ° °4. Follow up with the ordering physician for post care. ° °5. If you have any of the following please call [phone]: ° °    Temperature greater than 101 °    Pain, redness or swelling at the injection site ° ° °You may resume Xarelto today! °

## 2017-08-09 ENCOUNTER — Encounter: Payer: Self-pay | Admitting: *Deleted

## 2017-08-16 ENCOUNTER — Telehealth: Payer: Self-pay | Admitting: Neurology

## 2017-08-16 DIAGNOSIS — R55 Syncope and collapse: Secondary | ICD-10-CM

## 2017-08-16 NOTE — Telephone Encounter (Signed)
Pt's wife called states he's been having heart issues for the past several months and even passed out in January. He has A-Fib, a flutter, PVC and SVT and cardiomyopathy. He was seen at [facility] for a second opinion. PCP ordered MRI/brain at GI 2/18. She is wanting Dr Jannifer Franklin to look at the acutal scan, she said they trust his opinion over anybody. PCP said it showed age related process. She said he told her he felt like he was going to pass out yesterday but did not. The rest of the day he felt disconnected, zoned out. She is aware pt may need to come for appt. Please call to advise.

## 2017-08-16 NOTE — Telephone Encounter (Signed)
Spoke with wife--Pt. had MRI brain at [facility] on 07/16/17,  ordered by pcp.  Wife sts. pcp told them that the changes the radiologist saw seemed to mostly be age related changes.  Wife sts. they respect Dr. Tobey Grim opinion, would like to know what he thinks.  She is aware the radiologist read the MRI and Dr. Jannifer Franklin may only be reading the radiologist's report. She sts. this is fine with her--she just wanted his opinion, as he is the one who was able to steer them towards the dx. of A-Fib as a cause for his syncope/fim
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
## 2017-08-21 DIAGNOSIS — I480 Paroxysmal atrial fibrillation: Secondary | ICD-10-CM | POA: Diagnosis not present

## 2017-08-23 NOTE — Telephone Encounter (Signed)
I called and talked with the wife.  The patient had a cardiac monitor that did not show any significant episodes of cardiac rhythm problems, one episode of atrial fibrillation was noted early in the morning.  This did not correlate with his near syncopal event that he had while on the monitor.  I will go ahead and get an EEG study.

## 2017-08-23 NOTE — Telephone Encounter (Signed)
Pts wife called stating they got the results from pts heart monitor and nothing out of the ordinary had been shown. Pt and wife is wanting to go through with EEG if Dr. Jannifer Franklin sees this as the next best option

## 2017-08-23 NOTE — Addendum Note (Signed)
Addended by: Kathrynn Ducking on: 08/23/2017 04:43 PM   Modules accepted: Orders

## 2017-08-24 ENCOUNTER — Telehealth: Payer: Self-pay | Admitting: Neurology

## 2017-08-24 NOTE — Telephone Encounter (Signed)
Patient has requested for his EEG to be at [facility].

## 2017-08-24 NOTE — Telephone Encounter (Signed)
I did order the EEG to be done at [facility].  Please check the original order.

## 2017-08-27 ENCOUNTER — Telehealth: Payer: Self-pay | Admitting: Neurology

## 2017-08-27 NOTE — Telephone Encounter (Signed)
Called and left a message for Billy Carpenter and Billy Carpenter to please call patient to schedule EEG . [phone].  I have also called patient as well and he has the telephone number also.

## 2017-08-27 NOTE — Telephone Encounter (Signed)
Patient is scheduled for EEG Friday 08/31/2017  Arrive at 9:15 for 9:[address] . Patient has to come with clean dry hair . If patient can't make apt please call Devina at [facility] [phone].

## 2017-08-28 ENCOUNTER — Ambulatory Visit: Payer: Medicare Other | Admitting: Cardiovascular Disease

## 2017-08-29 ENCOUNTER — Other Ambulatory Visit: Payer: Self-pay | Admitting: Neurology

## 2017-08-29 DIAGNOSIS — R42 Dizziness and giddiness: Secondary | ICD-10-CM

## 2017-08-29 DIAGNOSIS — R55 Syncope and collapse: Principal | ICD-10-CM

## 2017-08-29 NOTE — Telephone Encounter (Signed)
Noted, thank you

## 2017-08-29 NOTE — Telephone Encounter (Signed)
Billy Carpenter- can you check on this? Order is in [place] for [employer] as future.

## 2017-08-29 NOTE — Telephone Encounter (Signed)
I have talked to [facility] at [facility] . This has been corrected patient is aware . Thanks Terrence Dupont .

## 2017-08-29 NOTE — Telephone Encounter (Signed)
Patient cancelled appointment for an EEG at [facility] and when he called back to reschedule he was told the order was gone and another order would need to be put in computer.

## 2017-08-31 ENCOUNTER — Ambulatory Visit (HOSPITAL_COMMUNITY): Payer: Medicare Other

## 2017-09-04 ENCOUNTER — Ambulatory Visit (HOSPITAL_COMMUNITY)
Admission: RE | Admit: 2017-09-04 | Discharge: 2017-09-04 | Disposition: A | Discharge status: Home / Self Care | Payer: Medicare Other | Source: Ambulatory Visit | Attending: Family Medicine | Admitting: Family Medicine

## 2017-09-04 DIAGNOSIS — R55 Syncope and collapse: Secondary | ICD-10-CM | POA: Diagnosis not present

## 2017-09-04 DIAGNOSIS — R42 Dizziness and giddiness: Secondary | ICD-10-CM | POA: Insufficient documentation

## 2017-09-04 DIAGNOSIS — Z7901 Long term (current) use of anticoagulants: Secondary | ICD-10-CM | POA: Insufficient documentation

## 2017-09-04 DIAGNOSIS — Z5181 Encounter for therapeutic drug level monitoring: Secondary | ICD-10-CM | POA: Insufficient documentation

## 2017-09-04 NOTE — Procedures (Signed)
HPI:  72 y/o with syncope  TECHNICAL SUMMARY:  A multichannel referential and bipolar montage EEG using the standard international 10-20 system was performed on the patient described as awake and drowsy.  The dominant background activity consists of 9 hertz activity seen most prominantly over the posterior head region.  The backgound activity is reactive to eye opening and closing procedures.  Low voltage fast (beta) activity is distributed symmetrically and maximally over the anterior head regions.  ACTIVATION:  Stepwise photic stimulation at 4-20 flashes per second was performed and did not elicit any abnormal waveforms.  Hyperventilation was not performed.  EPILEPTIFORM ACTIVITY:  There were no spikes, sharp waves or paroxysmal activity.  SLEEP: Both stage I and a brief stage II sleep are noted.  CARDIAC:  The EKG lead revealed a regular rhythm.  IMPRESSION:  This is a normal EEG for the patients stated age.  There were no focal, hemispheric or lateralizing features.  No epileptiform activity was recorded.  A normal EEG does not exclude the diagnosis of a seizure disorder and if seizure remains high on the list of differential diagnosis, an ambulatory EEG may be of value.  Clinical correlation is required.

## 2017-09-04 NOTE — Progress Notes (Signed)
EEG Completed; Results Pending  

## 2017-09-10 ENCOUNTER — Telehealth: Payer: Self-pay | Admitting: Neurology

## 2017-09-10 MED ORDER — LEVETIRACETAM 250 MG PO TABS: 250.0000 mg | ORAL_TABLET | Freq: Two times a day (BID) | ORAL | 3 refills | Status: DC

## 2017-09-10 NOTE — Telephone Encounter (Signed)
Pt wife(on DPR) is asking for a call once the EEG results are available

## 2017-09-10 NOTE — Addendum Note (Signed)
Addended by: Kathrynn Ducking on: 09/10/2017 10:18 AM   Modules accepted: Orders

## 2017-09-10 NOTE — Telephone Encounter (Signed)
  I called the patient.  The EEG study was normal, but the patient is still having events of syncope, the last event was 3 weeks ago, did not correlate with an abnormality on the cardiac monitor study.  The patient also has days where he feels that he is not completely with it, as if it takes time to get things done in his mind and body are disconnected.  We will give an empiric trial on low-dose Keppra taking 250 mg twice daily.  It is not clear what the syncopal events represent.  The patient has had a left middle cerebral artery distribution stroke that could be a seizure focus, however.  EEG 09/04/17:  IMPRESSION:  This is a normal EEG for the patients stated age.  There were no focal, hemispheric or lateralizing features.  No epileptiform activity was recorded.  A normal EEG does not exclude the diagnosis of a seizure disorder and if seizure remains high on the list of differential diagnosis, an ambulatory EEG may be of value.  Clinical correlation is required.

## 2017-09-13 DIAGNOSIS — I480 Paroxysmal atrial fibrillation: Secondary | ICD-10-CM | POA: Diagnosis not present

## 2017-09-13 DIAGNOSIS — R55 Syncope and collapse: Secondary | ICD-10-CM | POA: Diagnosis not present

## 2017-09-17 DIAGNOSIS — H524 Presbyopia: Secondary | ICD-10-CM | POA: Diagnosis not present

## 2017-09-17 DIAGNOSIS — H2513 Age-related nuclear cataract, bilateral: Secondary | ICD-10-CM | POA: Diagnosis not present

## 2017-09-17 DIAGNOSIS — H52203 Unspecified astigmatism, bilateral: Secondary | ICD-10-CM | POA: Diagnosis not present

## 2017-09-17 DIAGNOSIS — H5203 Hypermetropia, bilateral: Secondary | ICD-10-CM | POA: Diagnosis not present

## 2017-09-17 DIAGNOSIS — H5371 Glare sensitivity: Secondary | ICD-10-CM | POA: Diagnosis not present

## 2017-09-25 DIAGNOSIS — G40909 Epilepsy, unspecified, not intractable, without status epilepticus: Secondary | ICD-10-CM | POA: Diagnosis not present

## 2017-09-25 DIAGNOSIS — M4800 Spinal stenosis, site unspecified: Secondary | ICD-10-CM | POA: Diagnosis not present

## 2017-09-25 DIAGNOSIS — Z7901 Long term (current) use of anticoagulants: Secondary | ICD-10-CM | POA: Diagnosis not present

## 2017-09-25 DIAGNOSIS — I4891 Unspecified atrial fibrillation: Secondary | ICD-10-CM | POA: Diagnosis not present

## 2017-10-04 NOTE — Telephone Encounter (Signed)
Pt called stating he is unsure if he will need a f/u appt to determine if the keppra dosing is correct. Pt states he has been taking Keppra since 4/25 and has only experienced slight doziness as a s/e. Please call to advise.

## 2017-10-04 NOTE — Telephone Encounter (Signed)
I called the patient.  The patient will continue on the [place] for now, he is having some slight drowsiness only.  We will need more time on the medication to determine if it helps his episodes or not.  I will get a revisit set up in the next 2 to 3 months.

## 2017-10-05 NOTE — Telephone Encounter (Signed)
LVM for pt (ok per DPR) offering appt for 12/21/17 at 12pm, check in 1130am. Asked him to call back to let me know if this works. Gave [facility] phone number.

## 2017-10-08 NOTE — Telephone Encounter (Signed)
I scheduled pt for 12/21/17 at 12pm w/ Dr. Jannifer Franklin

## 2017-10-08 NOTE — Telephone Encounter (Signed)
Pt returning RNs call would like to accept appt time given for 12/21/17 (RN will have to schedule I do not have override access). Pt would also like a call back to discuss dosing for keppra before appt. Please call to advise

## 2017-10-08 NOTE — Telephone Encounter (Signed)
Patient called office back. He states PCP questioned if keppra levels would need to be checked via blood work. I advised this would be monitored. He recently started on medication. He states he feels he is tolerating well, only some fatigue. He will come for f/u on 12/21/17. Nothing further needed.

## 2017-10-08 NOTE — Telephone Encounter (Signed)
Called, LVM for pt to call. I was calling him back in regards to questions he had about keppra dosing.

## 2017-10-10 ENCOUNTER — Other Ambulatory Visit: Payer: Medicare Other

## 2017-10-15 DIAGNOSIS — D044 Carcinoma in situ of skin of scalp and neck: Secondary | ICD-10-CM | POA: Diagnosis not present

## 2017-10-15 DIAGNOSIS — Z85828 Personal history of other malignant neoplasm of skin: Secondary | ICD-10-CM | POA: Diagnosis not present

## 2017-10-15 DIAGNOSIS — L814 Other melanin hyperpigmentation: Secondary | ICD-10-CM | POA: Diagnosis not present

## 2017-10-15 DIAGNOSIS — L570 Actinic keratosis: Secondary | ICD-10-CM | POA: Diagnosis not present

## 2017-10-15 DIAGNOSIS — D225 Melanocytic nevi of trunk: Secondary | ICD-10-CM | POA: Diagnosis not present

## 2017-10-15 DIAGNOSIS — D485 Neoplasm of uncertain behavior of skin: Secondary | ICD-10-CM | POA: Diagnosis not present

## 2017-10-15 DIAGNOSIS — D1800 Hemangioma unspecified site: Secondary | ICD-10-CM | POA: Diagnosis not present

## 2017-10-15 DIAGNOSIS — L821 Other seborrheic keratosis: Secondary | ICD-10-CM | POA: Diagnosis not present

## 2017-10-15 DIAGNOSIS — S90222A Contusion of left lesser toe(s) with damage to nail, initial encounter: Secondary | ICD-10-CM | POA: Diagnosis not present

## 2017-10-16 DIAGNOSIS — D044 Carcinoma in situ of skin of scalp and neck: Secondary | ICD-10-CM | POA: Diagnosis not present

## 2017-10-16 DIAGNOSIS — D485 Neoplasm of uncertain behavior of skin: Secondary | ICD-10-CM | POA: Diagnosis not present

## 2017-10-17 ENCOUNTER — Other Ambulatory Visit: Payer: Self-pay | Admitting: Physician Assistant

## 2017-10-30 DIAGNOSIS — C4442 Squamous cell carcinoma of skin of scalp and neck: Secondary | ICD-10-CM | POA: Diagnosis not present

## 2017-10-30 DIAGNOSIS — C4492 Squamous cell carcinoma of skin, unspecified: Secondary | ICD-10-CM | POA: Diagnosis not present

## 2017-11-06 ENCOUNTER — Other Ambulatory Visit (HOSPITAL_COMMUNITY): Payer: Medicare Other

## 2017-11-13 ENCOUNTER — Ambulatory Visit: Payer: Medicare Other | Admitting: Cardiology

## 2017-12-21 ENCOUNTER — Encounter: Payer: Self-pay | Admitting: Neurology

## 2017-12-21 ENCOUNTER — Ambulatory Visit (INDEPENDENT_AMBULATORY_CARE_PROVIDER_SITE_OTHER): Payer: Medicare Other | Admitting: Neurology

## 2017-12-21 VITALS — BP 134/87 | HR 65 | Ht 68.0 in | Wt 154.0 lb

## 2017-12-21 DIAGNOSIS — G8929 Other chronic pain: Secondary | ICD-10-CM | POA: Diagnosis not present

## 2017-12-21 DIAGNOSIS — M545 Low back pain: Secondary | ICD-10-CM | POA: Diagnosis not present

## 2017-12-21 DIAGNOSIS — R55 Syncope and collapse: Secondary | ICD-10-CM

## 2017-12-21 HISTORY — DX: Syncope and collapse: R55

## 2017-12-21 MED ORDER — LEVETIRACETAM 250 MG PO TABS: 250.0000 mg | ORAL_TABLET | Freq: Two times a day (BID) | ORAL | 3 refills | Status: DC
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
## 2018-01-15 ENCOUNTER — Other Ambulatory Visit (HOSPITAL_COMMUNITY): Payer: Self-pay | Admitting: Nurse Practitioner

## 2018-01-15 DIAGNOSIS — L820 Inflamed seborrheic keratosis: Secondary | ICD-10-CM | POA: Diagnosis not present

## 2018-01-15 DIAGNOSIS — L570 Actinic keratosis: Secondary | ICD-10-CM | POA: Diagnosis not present

## 2018-01-15 DIAGNOSIS — W57XXXA Bitten or stung by nonvenomous insect and other nonvenomous arthropods, initial encounter: Secondary | ICD-10-CM | POA: Diagnosis not present

## 2018-01-15 DIAGNOSIS — S30860A Insect bite (nonvenomous) of lower back and pelvis, initial encounter: Secondary | ICD-10-CM | POA: Diagnosis not present

## 2018-01-18 ENCOUNTER — Other Ambulatory Visit (HOSPITAL_COMMUNITY): Payer: Self-pay

## 2018-01-21 DIAGNOSIS — I4891 Unspecified atrial fibrillation: Secondary | ICD-10-CM | POA: Diagnosis not present

## 2018-01-21 DIAGNOSIS — Z7901 Long term (current) use of anticoagulants: Secondary | ICD-10-CM | POA: Diagnosis not present

## 2018-01-21 DIAGNOSIS — L03011 Cellulitis of right finger: Secondary | ICD-10-CM | POA: Diagnosis not present

## 2018-01-23 DIAGNOSIS — L0291 Cutaneous abscess, unspecified: Secondary | ICD-10-CM | POA: Diagnosis not present

## 2018-01-23 DIAGNOSIS — L03011 Cellulitis of right finger: Secondary | ICD-10-CM | POA: Diagnosis not present

## 2018-01-23 DIAGNOSIS — G4700 Insomnia, unspecified: Secondary | ICD-10-CM | POA: Diagnosis not present

## 2018-01-23 DIAGNOSIS — Z7901 Long term (current) use of anticoagulants: Secondary | ICD-10-CM | POA: Diagnosis not present

## 2018-01-25 DIAGNOSIS — Z23 Encounter for immunization: Secondary | ICD-10-CM | POA: Diagnosis not present

## 2018-03-11 ENCOUNTER — Telehealth: Payer: Self-pay | Admitting: Cardiology

## 2018-03-11 NOTE — Telephone Encounter (Signed)
° °  Patient request to switch from seeing  Dr Curt Bears to Dr Caryl Comes.  Please advise

## 2018-03-12 NOTE — Telephone Encounter (Signed)
Ok with me 

## 2018-04-09 ENCOUNTER — Other Ambulatory Visit: Payer: Self-pay | Admitting: Physician Assistant

## 2018-05-07 DIAGNOSIS — Z23 Encounter for immunization: Secondary | ICD-10-CM | POA: Diagnosis not present

## 2018-05-07 DIAGNOSIS — L820 Inflamed seborrheic keratosis: Secondary | ICD-10-CM | POA: Diagnosis not present

## 2018-06-25 ENCOUNTER — Ambulatory Visit (INDEPENDENT_AMBULATORY_CARE_PROVIDER_SITE_OTHER): Payer: Medicare Other | Admitting: Neurology

## 2018-06-25 ENCOUNTER — Encounter: Payer: Self-pay | Admitting: Neurology

## 2018-06-25 VITALS — BP 119/81 | HR 74 | Ht 68.0 in | Wt 157.0 lb

## 2018-06-25 DIAGNOSIS — R55 Syncope and collapse: Secondary | ICD-10-CM
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
## 2018-06-27 DIAGNOSIS — I480 Paroxysmal atrial fibrillation: Secondary | ICD-10-CM | POA: Diagnosis not present

## 2018-06-27 DIAGNOSIS — R55 Syncope and collapse: Secondary | ICD-10-CM | POA: Diagnosis not present

## 2018-06-27 DIAGNOSIS — I471 Supraventricular tachycardia: Secondary | ICD-10-CM | POA: Diagnosis not present

## 2018-07-05 DIAGNOSIS — E785 Hyperlipidemia, unspecified: Secondary | ICD-10-CM | POA: Diagnosis not present

## 2018-07-05 DIAGNOSIS — Z789 Other specified health status: Secondary | ICD-10-CM | POA: Diagnosis not present

## 2018-07-05 DIAGNOSIS — I4891 Unspecified atrial fibrillation: Secondary | ICD-10-CM | POA: Diagnosis not present

## 2018-07-05 DIAGNOSIS — M4800 Spinal stenosis, site unspecified: Secondary | ICD-10-CM | POA: Diagnosis not present

## 2018-07-05 DIAGNOSIS — Z0000 Encounter for general adult medical examination without abnormal findings: Secondary | ICD-10-CM | POA: Diagnosis not present

## 2018-07-24 ENCOUNTER — Other Ambulatory Visit: Payer: Self-pay | Admitting: Orthopedic Surgery

## 2018-07-24 DIAGNOSIS — M47817 Spondylosis without myelopathy or radiculopathy, lumbosacral region: Secondary | ICD-10-CM | POA: Diagnosis not present

## 2018-07-24 DIAGNOSIS — M4310 Spondylolisthesis, site unspecified: Secondary | ICD-10-CM | POA: Diagnosis not present

## 2018-07-24 DIAGNOSIS — M48061 Spinal stenosis, lumbar region without neurogenic claudication: Secondary | ICD-10-CM | POA: Diagnosis not present

## 2018-07-25 ENCOUNTER — Other Ambulatory Visit: Payer: Self-pay | Admitting: Orthopedic Surgery

## 2018-07-25 DIAGNOSIS — M47817 Spondylosis without myelopathy or radiculopathy, lumbosacral region: Secondary | ICD-10-CM

## 2018-07-30 DIAGNOSIS — M25511 Pain in right shoulder: Secondary | ICD-10-CM | POA: Diagnosis not present

## 2018-07-30 DIAGNOSIS — M7541 Impingement syndrome of right shoulder: Secondary | ICD-10-CM | POA: Insufficient documentation

## 2018-08-02 ENCOUNTER — Ambulatory Visit
Admission: RE | Admit: 2018-08-02 | Discharge: 2018-08-02 | Disposition: A | Discharge status: Home / Self Care | Payer: Medicare Other | Source: Ambulatory Visit | Attending: Orthopedic Surgery | Admitting: Orthopedic Surgery

## 2018-08-02 ENCOUNTER — Other Ambulatory Visit: Payer: Self-pay | Admitting: Orthopedic Surgery

## 2018-08-02 DIAGNOSIS — M47817 Spondylosis without myelopathy or radiculopathy, lumbosacral region: Secondary | ICD-10-CM

## 2018-08-02 DIAGNOSIS — M545 Low back pain: Secondary | ICD-10-CM | POA: Diagnosis not present

## 2018-08-02 MED ORDER — IOPAMIDOL (ISOVUE-M 200) INJECTION 41%
1.0000 mL | Freq: Once | INTRAMUSCULAR | Status: AC
  Administered 2018-08-02: 1 mL via INTRA_ARTICULAR

## 2018-08-02 MED ORDER — METHYLPREDNISOLONE ACETATE 40 MG/ML INJ SUSP (RADIOLOG
120.0000 mg | Freq: Once | INTRAMUSCULAR | Status: AC
  Administered 2018-08-02: 120 mg via INTRA_ARTICULAR

## 2018-08-02 NOTE — Discharge Instructions (Signed)
Spinal Injection Discharge Instruction Sheet  1. You may resume a regular diet and any medications that you routinely take, including pain medications.  2. No driving the rest of the day of the procedure.  3. Light activity throughout the rest of the day.  Do not do any strenuous work, exercise, bending or lifting.  The day following the procedure, you may resume normal physical activity but you should refrain from exercising or physical therapy for at least three days.   Common Side Effects:   Headaches- take your usual medications as directed by your physician.     Restlessness or inability to sleep- you may have trouble sleeping for the next few days.  Ask your referring physician if you need any medication for sleep if over the counter sleep medications do not help.   Facial flushing or redness- this should subside within a few days.   Increased pain- a temporary increase in pain a day or two following your procedure is not unusual.  Take your pain medication as prescribed by your referring physician.  You may use ice to the injection site as needed.  Please do not use heat for 24 hours.   Leg cramps  Please contact our office at [phone] for the following symptoms:  Fever greater than 100 degrees.  Headaches unresolved with medication after 2-3 days.  Increased swelling, pain, or redness at injection site.  Thank you for visiting our office.   You may resume Xarelto today.

## 2018-08-08 DIAGNOSIS — M25511 Pain in right shoulder: Secondary | ICD-10-CM | POA: Diagnosis not present

## 2018-08-13 DIAGNOSIS — M19019 Primary osteoarthritis, unspecified shoulder: Secondary | ICD-10-CM | POA: Diagnosis not present

## 2018-08-13 DIAGNOSIS — M25511 Pain in right shoulder: Secondary | ICD-10-CM | POA: Diagnosis not present

## 2018-08-13 DIAGNOSIS — M7541 Impingement syndrome of right shoulder: Secondary | ICD-10-CM | POA: Diagnosis not present

## 2018-08-13 DIAGNOSIS — M19011 Primary osteoarthritis, right shoulder: Secondary | ICD-10-CM | POA: Diagnosis not present

## 2018-10-25 DIAGNOSIS — I4891 Unspecified atrial fibrillation: Secondary | ICD-10-CM | POA: Diagnosis not present

## 2018-10-25 DIAGNOSIS — S61215A Laceration without foreign body of left ring finger without damage to nail, initial encounter: Secondary | ICD-10-CM | POA: Diagnosis not present

## 2018-10-25 DIAGNOSIS — M4800 Spinal stenosis, site unspecified: Secondary | ICD-10-CM | POA: Diagnosis not present

## 2018-10-25 DIAGNOSIS — E785 Hyperlipidemia, unspecified: Secondary | ICD-10-CM | POA: Diagnosis not present

## 2018-12-10 ENCOUNTER — Telehealth: Payer: Self-pay | Admitting: Neurology

## 2018-12-10 MED ORDER — LEVETIRACETAM 500 MG PO TABS: 500.0000 mg | ORAL_TABLET | Freq: Two times a day (BID) | ORAL | 3 refills | Status: DC

## 2018-12-10 NOTE — Telephone Encounter (Signed)
I contacted the Billy Carpenter's wife (ok per dpr) She states yesterday the Billy Carpenter had a dizzy/ blacking out spell and fell on the ground. Wife states after this spell the Billy Carpenter felt disconnected all day but has returned to base line today. She sates Billy Carpenter was able to check BP and HR right after this episode and BP was normal and no evidence of a-fib was present.  Billy Carpenter is scheduled for 12/24/18 f/u and was advised to keep as a face to face for proper evaluation. I then spoke with the Billy Carpenter and he was agreeable with keeping appointment as scheduled.   Billy Carpenter did not want to verify between now and appt, if Dr. Jannifer Franklin felt like any medication changes needed to be made or of this should wait and be addressed on 12/24/18?  I advised I would send message.

## 2018-12-10 NOTE — Telephone Encounter (Signed)
Pt's wife called and stated that the pt had small passing out and dizzy spell yesterday and is wanting to be advised on this. Also she is wanting to know if the appt that the pt has scheduled can be a Tele Visit. Please advise.

## 2018-12-10 NOTE — Telephone Encounter (Signed)
I called the patient.  The patient had another brief episode of loss of consciousness, he literally believes that he woke up as he was falling to the ground.  He did not sustain significant injury.  He is on Xarelto.  In the past, he has had syncopal events while being monitored that did not show cardiac abnormalities.  The patient will go up on the [place] taking 500 mg twice daily although the episode noted above appears to be atypical for true seizure event.  He has felt somewhat spaced out for almost 24 hours after the blackout event.  When the patient is seen next in the end of July, I will check blood work and set him up for a CT angiogram of the head and neck.

## 2018-12-24 ENCOUNTER — Ambulatory Visit (INDEPENDENT_AMBULATORY_CARE_PROVIDER_SITE_OTHER): Payer: Medicare Other | Admitting: Neurology

## 2018-12-24 ENCOUNTER — Encounter: Payer: Self-pay | Admitting: Neurology

## 2018-12-24 ENCOUNTER — Other Ambulatory Visit: Payer: Self-pay

## 2018-12-24 ENCOUNTER — Telehealth: Payer: Self-pay | Admitting: Neurology

## 2018-12-24 VITALS — BP 109/77 | HR 75 | Temp 96.9°F | Ht 68.0 in | Wt 152.0 lb

## 2018-12-24 DIAGNOSIS — R55 Syncope and collapse: Secondary | ICD-10-CM

## 2018-12-24 DIAGNOSIS — Z5181 Encounter for therapeutic drug level monitoring: Secondary | ICD-10-CM

## 2018-12-24 MED ORDER — LACOSAMIDE 50 MG PO TABS: ORAL_TABLET | ORAL | 3 refills | Status: DC

## 2018-12-24 NOTE — Progress Notes (Signed)
Reason for visit: Syncope  Billy Carpenter is an 73 y.o. male  History of present illness:  Billy Carpenter is a 73 year old right-handed white male with a history of episodes of brief syncope.  He is on an empiric trial of seizure medications, he takes Keppra 250 mg twice daily.  He unfortunately had a recent brief syncopal episode that occurred around 10 December 2018.  The patient was out in the yard, he was hot, he was swinging his golf clubs.  He was returning to go inside the house when he had a brief episode of syncope, he literally was unconscious only for fraction of a second, the episode occurred without warning.  He recalls being conscious prior to hitting the ground, he was able to hold his arms out in front of him to break the fall.  He remembers no sensation of dizziness or palpitations of the heart prior to the event.  He has only had blackout events while standing, not while sitting or lying down.  He does report some episodes of vertigo that occurs when he tilts his head backwards to look up.  He does get somewhat lightheaded if he stands up quickly at times.  He has been able to do yard work in the hot weather without any difficulty since that recent blackout.  The patient felt somewhat spacey for about 24 hours after the recent blackout, but he has now returned back to normal.  The patient returns for further evaluation.  He has reported chronic insomnia on Keppra.  Past Medical History:  Diagnosis Date  . BPH (benign prostatic hyperplasia)   . Carotid artery disease ([place])    Left internal carotid artery spontaneous dissection in the past  //   Doppler, October, 2013, minimal plaque, 0-39% bilateral  . Chronic low back pain 05/03/2017  . CVA (cerebral infarction)    left insular cortex stroke with left internal carotid artery dissection and near occlusion  . Dyslipidemia   . Hematuria   . Hypertension   . Mitral regurgitation    mild, echo, March, 2010  . Persistent atrial  fibrillation   . Syncope   . Syncope and collapse 12/21/2017    Past Surgical History:  Procedure Laterality Date  . BLADDER STONE REMOVAL    . LEFT HEART CATH AND CORONARY ANGIOGRAPHY N/A 06/27/2017   Procedure: LEFT HEART CATH AND CORONARY ANGIOGRAPHY;  Surgeon: Belva Crome, MD;  Location: [facility];  Service: Cardiovascular;  Laterality: N/A;  . PROSTATE SURGERY  2009  . WRIST SURGERY     Right    Family History  Problem Relation Age of Onset  . Arrhythmia Mother   . Hypertension Mother   . Arthritis Mother   . Other Father        tumor  . Prostate cancer Father   . CVA Neg Hx     Social history:  reports that he has never smoked. He has never used smokeless tobacco. He reports current alcohol use. He reports that he does not use drugs.   No Known Allergies  Medications:  Prior to Admission medications   Medication Sig Start Date End Date Taking? Authorizing Provider  atorvastatin (LIPITOR) 20 MG tablet Take 1 tablet (20 mg total) by mouth daily. Patient taking differently: Take 20 mg by mouth at bedtime.  06/20/16  Yes Nahser, Wonda Cheng, MD  Flecainide Acetate (TAMBOCOR PO) Take 75 mg by mouth 2 (two) times a day.   Yes [provider]  levETIRAcetam (KEPPRA) 500 MG tablet Take 1 tablet (500 mg total) by mouth 2 (two) times daily. 12/10/18  Yes Kathrynn Ducking, MD  metoprolol succinate (TOPROL-XL) 25 MG 24 hr tablet  12/21/18  Yes [provider]  XARELTO 20 MG TABS tablet TAKE 1 TABLET BY MOUTH EVERY DAY WITH SUPPER 01/15/18  Yes Sherran Needs, NP    ROS:  Out of a complete 14 system review of symptoms, the patient complains only of the following symptoms, and all other reviewed systems are negative.  Insomnia Vertigo Syncope  Blood pressure 109/77, pulse 75, temperature (!) 96.9 F (36.1 C), temperature source Temporal, height 5\' 8"  (1.727 m), weight 152 lb (68.9 kg).   Blood pressure, right arm, sitting is 116/80.  Blood pressure,  right arm, standing is 112/82.  Physical Exam  General: The patient is alert and cooperative at the time of the examination.  Skin: No significant peripheral edema is noted.   Neurologic Exam  Mental status: The patient is alert and oriented x 3 at the time of the examination. The patient has apparent normal recent and remote memory, with an apparently normal attention span and concentration ability.   Cranial nerves: Facial symmetry is present. Speech is normal, no aphasia or dysarthria is noted. Extraocular movements are full. Visual fields are full.  Motor: The patient has good strength in all 4 extremities.  Sensory examination: Soft touch sensation is symmetric on the face, arms, and legs.  Coordination: The patient has good finger-nose-finger and heel-to-shin bilaterally.  Gait and station: The patient has a normal gait. Tandem gait is normal. Romberg is negative. No drift is seen.  Reflexes: Deep tendon reflexes are symmetric.   Assessment/Plan:  1.  Recurrent syncope, etiology unclear  The patient is having some issues with Keppra with insomnia, we will reduce the dose to 250 mg twice daily for 2 weeks and then stop.  He will start Vimpat 50 mg twice daily for 2 weeks and then go to 100 mg twice daily.  He will be set up for blood work today and have a CT angiogram of the head and neck to exclude cerebrovascular circulation issues resulting in syncope.  If the CT angiogram is unremarkable and the patient continues to have vertigo, we may consider vestibular rehabilitation.  He will otherwise follow-up in 4 months.  Jill Alexanders MD 12/24/2018 9:52 AM  [facility] [address] [place], [place] [address]  Phone [phone] Fax [phone]

## 2018-12-24 NOTE — Patient Instructions (Signed)
We will go to 250 mg of the keppra twice a day for 2 weeks, then stop the medication.  Start Vimpat 50 mg twice a day for 2 weeks, then take 100 mg twice a day.

## 2018-12-24 NOTE — Telephone Encounter (Signed)
Medicare/bcbs supp order sent to GI. No auith they will reach out to the patient to schedule.

## 2018-12-25 LAB — COMPREHENSIVE METABOLIC PANEL
ALT: 36 IU/L (ref 0–44)
AST: 26 IU/L (ref 0–40)
Albumin/Globulin Ratio: 2.7 — ABNORMAL HIGH (ref 1.2–2.2)
Albumin: 4.6 g/dL (ref 3.7–4.7)
Alkaline Phosphatase: 84 IU/L (ref 39–117)
BUN/Creatinine Ratio: 24 (ref 10–24)
BUN: 25 mg/dL (ref 8–27)
Bilirubin Total: 0.7 mg/dL (ref 0.0–1.2)
CO2: 23 mmol/L (ref 20–29)
Calcium: 9.2 mg/dL (ref 8.6–10.2)
Chloride: 103 mmol/L (ref 96–106)
Creatinine, Ser: 1.03 mg/dL (ref 0.76–1.27)
GFR calc Af Amer: 82 mL/min/{1.73_m2} (ref >59)
GFR calc non Af Amer: 71 mL/min/{1.73_m2} (ref >59)
Globulin, Total: 1.7 g/dL (ref 1.5–4.5)
Glucose: 83 mg/dL (ref 65–99)
Potassium: 4.7 mmol/L (ref 3.5–5.2)
Sodium: 141 mmol/L (ref 134–144)
Total Protein: 6.3 g/dL (ref 6.0–8.5)

## 2018-12-26 ENCOUNTER — Telehealth: Payer: Self-pay | Admitting: Neurology

## 2018-12-26 ENCOUNTER — Other Ambulatory Visit: Payer: Self-pay

## 2018-12-26 MED ORDER — LAMOTRIGINE 25 MG PO TABS: ORAL_TABLET | ORAL | 1 refills | Status: DC

## 2018-12-26 NOTE — Telephone Encounter (Signed)
pgt called in and stated he went in to pick up his lacosamide (VIMPAT) 50 MG TABS tablet  and the total was 368.00 for 30days he states that is to expensive, is there a generic or anything else he can try

## 2018-12-26 NOTE — Telephone Encounter (Signed)
I called the patient.  The patient could not afford the Vimpat, we will go with Lamictal instead, working up by 50 mg every 2 weeks, when he gets to the 75 mg twice daily dose, he will call me and I will send in the 100 mg tablet prescription.

## 2019-01-07 ENCOUNTER — Ambulatory Visit
Admission: RE | Admit: 2019-01-07 | Discharge: 2019-01-07 | Disposition: A | Discharge status: Home / Self Care | Payer: Medicare Other | Source: Ambulatory Visit | Attending: Neurology | Admitting: Neurology

## 2019-01-07 ENCOUNTER — Telehealth: Payer: Self-pay | Admitting: Neurology

## 2019-01-07 DIAGNOSIS — R55 Syncope and collapse: Secondary | ICD-10-CM

## 2019-01-07 DIAGNOSIS — I6523 Occlusion and stenosis of bilateral carotid arteries: Secondary | ICD-10-CM | POA: Diagnosis not present

## 2019-01-07 DIAGNOSIS — R42 Dizziness and giddiness: Secondary | ICD-10-CM

## 2019-01-07 MED ORDER — IOPAMIDOL (ISOVUE-370) INJECTION 76%
75.0000 mL | Freq: Once | INTRAVENOUS | Status: AC | PRN
  Administered 2019-01-07: 75 mL via INTRAVENOUS

## 2019-01-07 NOTE — Telephone Encounter (Signed)
I called the patient.  The CT angiogram does show 2 areas of significant compression of the left vertebral artery due to facet joint arthritis.  The right vertebral artery however is fully patent, not clear that this would cause a blackout.  The patient is still having some dizziness and vertigo when he gets out of bed in the morning and makes any sudden move, I will go ahead and set up physical therapy for vestibular rehab.  The patient is not to drive currently we will watch him for several months to make sure he does not have another syncopal event, he has had one episode while driving previously.   CTA of the head and neck 01/07/19:  IMPRESSION: 1. Chronic infarct left insula and left frontal operculum unchanged from prior studies. No acute intracranial abnormality 2. Mild atherosclerotic disease in the carotid bulb bilaterally without significant carotid stenosis. Mild fusiform dilatation of the distal internal carotid artery bilaterally as noted on prior studies. No evidence of dissection or FMD at this time. 3. Right vertebral artery widely patent. Two segments of severe extrinsic compression of the left vertebral artery due to marked bony overgrowth and facet joints at C3-4 and C5-6.

## 2019-01-21 ENCOUNTER — Telehealth: Payer: Self-pay | Admitting: Neurology

## 2019-01-21 DIAGNOSIS — R42 Dizziness and giddiness: Secondary | ICD-10-CM

## 2019-01-21 DIAGNOSIS — R55 Syncope and collapse: Secondary | ICD-10-CM

## 2019-01-21 NOTE — Telephone Encounter (Signed)
Pt has called re: his lamoTRIgine (LAMICTAL) 25 MG tablet that he started on early this month.  Pt is having vivid dreams causing insomnia.  Pt is asking for a call to discuss trying another medication

## 2019-01-21 NOTE — Addendum Note (Signed)
Addended by: Kathrynn Ducking on: 01/21/2019 06:59 PM   Modules accepted: Orders

## 2019-01-21 NOTE — Telephone Encounter (Signed)
I called the patient.  The patient is feeling worse on the Lamictal, he is off Keppra, he is having severe insomnia and vivid dreams, he was having this on Keppra as well.  He will taper off of the Lamictal, call me in 2 to 3 weeks, depending on how he is doing we may add Depakote in the future.

## 2019-01-24 ENCOUNTER — Ambulatory Visit: Payer: Medicare Other

## 2019-01-27 ENCOUNTER — Other Ambulatory Visit: Payer: Self-pay | Admitting: Neurology

## 2019-02-13 DIAGNOSIS — E785 Hyperlipidemia, unspecified: Secondary | ICD-10-CM | POA: Diagnosis not present

## 2019-02-13 DIAGNOSIS — R7309 Other abnormal glucose: Secondary | ICD-10-CM | POA: Diagnosis not present

## 2019-02-13 DIAGNOSIS — I4891 Unspecified atrial fibrillation: Secondary | ICD-10-CM | POA: Diagnosis not present

## 2019-02-13 DIAGNOSIS — S61215A Laceration without foreign body of left ring finger without damage to nail, initial encounter: Secondary | ICD-10-CM | POA: Diagnosis not present

## 2019-02-13 DIAGNOSIS — Z125 Encounter for screening for malignant neoplasm of prostate: Secondary | ICD-10-CM | POA: Diagnosis not present

## 2019-02-13 DIAGNOSIS — Z23 Encounter for immunization: Secondary | ICD-10-CM | POA: Diagnosis not present

## 2019-02-13 DIAGNOSIS — I10 Essential (primary) hypertension: Secondary | ICD-10-CM | POA: Diagnosis not present

## 2019-02-20 NOTE — Telephone Encounter (Signed)
Pt called stating that he has had significant improvement since the change of his medications. He states that he is still having intense dreams but they are not as bad. Pt does want to go ahead and start therapy for the Vertigo. Please advise.

## 2019-02-20 NOTE — Telephone Encounter (Signed)
I called the patient.  The patient has done better off of the Lamictal, still having some vivid dreams at night on occasion.  We will set him up for a 30-day cardiac monitor study, he did have a blackout episode in July 2020.  He is not driving a car currently.  I will get him set up for vestibular rehab, he does have positional vertigo, when he looks up or tilts his head backwards, he gets vertigo.

## 2019-02-20 NOTE — Addendum Note (Signed)
Addended by: Kathrynn Ducking on: 02/20/2019 05:19 PM   Modules accepted: Orders

## 2019-02-26 ENCOUNTER — Telehealth: Payer: Self-pay | Admitting: *Deleted

## 2019-02-26 NOTE — Telephone Encounter (Signed)
[employer] to ship a 30 day cardiac event monitor to the patients home.  Patient has work a Programme researcher, broadcasting/film/video in the past and is familiar with how they work.  Instructions will be included in the monitor kit.

## 2019-03-05 ENCOUNTER — Other Ambulatory Visit: Payer: Self-pay | Admitting: Neurology

## 2019-03-06 ENCOUNTER — Ambulatory Visit (INDEPENDENT_AMBULATORY_CARE_PROVIDER_SITE_OTHER): Payer: Medicare Other

## 2019-03-06 DIAGNOSIS — R55 Syncope and collapse: Secondary | ICD-10-CM | POA: Diagnosis not present

## 2019-03-08 DIAGNOSIS — Z20828 Contact with and (suspected) exposure to other viral communicable diseases: Secondary | ICD-10-CM | POA: Diagnosis not present

## 2019-03-10 ENCOUNTER — Telehealth: Payer: Self-pay | Admitting: Neurology

## 2019-03-10 DIAGNOSIS — Z23 Encounter for immunization: Secondary | ICD-10-CM | POA: Diagnosis not present

## 2019-03-10 NOTE — Telephone Encounter (Signed)
I called the patient.  The patient had the monitor placed and has had this for less than a week, already had 2 episodes of dizziness, both in the morning shortly after taking the flecainide and metoprolol.  The patient is wearing his cardiac monitor.  He may feel fluttering in the chest with this, he has not gotten a call indicating that the heart monitor picked up on anything sinister.  The patient will continue the full 30-day monitoring.  He is off his anticonvulsant medications.

## 2019-03-10 NOTE — Telephone Encounter (Signed)
Pt called in and stated that he is currently wearing his heart monitor , and on Friday October 9 about 9:45am he was in the shower and became very lightheaded he states he took his meds shortly before and he had another similar event this morning around the same time. He is requesting a call back to discuss. He states his BP was running high also.

## 2019-04-07 ENCOUNTER — Other Ambulatory Visit: Payer: Self-pay

## 2019-04-07 ENCOUNTER — Ambulatory Visit: Payer: Medicare Other | Attending: Neurology

## 2019-04-07 DIAGNOSIS — R42 Dizziness and giddiness: Secondary | ICD-10-CM | POA: Insufficient documentation

## 2019-04-07 DIAGNOSIS — R2689 Other abnormalities of gait and mobility: Secondary | ICD-10-CM

## 2019-04-07 NOTE — Therapy (Signed)
[facility] [address] [place], [place], [address] Phone: [phone]   Fax:  [phone]  Physical Therapy Evaluation  Patient Details  Name: Billy Carpenter MRN: [id number] Date of Birth: 1946-03-17 Referring Provider (PT): Dr. Jannifer Franklin   Encounter Date: 04/07/2019  PT End of Session - 04/07/19 1006    Visit Number  1    Number of Visits  5    Date for PT Re-Evaluation  06/06/19    Authorization Type  Medicare and BCBS (progress note every 10th visit)    Activity Tolerance  Patient tolerated treatment well    Behavior During Therapy  [facility] for tasks assessed/performed       Past Medical History:  Diagnosis Date  . BPH (benign prostatic hyperplasia)   . Carotid artery disease ([place])    Left internal carotid artery spontaneous dissection in the past  //   Doppler, October, 2013, minimal plaque, 0-39% bilateral  . Chronic low back pain 05/03/2017  . CVA (cerebral infarction)    left insular cortex stroke with left internal carotid artery dissection and near occlusion  . Dyslipidemia   . Hematuria   . Hypertension   . Mitral regurgitation    mild, echo, March, 2010  . Persistent atrial fibrillation ([place])   . Syncope   . Syncope and collapse 12/21/2017    Past Surgical History:  Procedure Laterality Date  . BLADDER STONE REMOVAL    . LEFT HEART CATH AND CORONARY ANGIOGRAPHY N/A 06/27/2017   Procedure: LEFT HEART CATH AND CORONARY ANGIOGRAPHY;  Surgeon: Belva Crome, MD;  Location: [facility];  Service: Cardiovascular;  Laterality: N/A;  . PROSTATE SURGERY  2009  . WRIST SURGERY     Right    There were no vitals filed for this visit.   Subjective Assessment - 04/07/19 0808    Subjective  Pt reported dizziness began on 12/07/18 while practicing golf swing in front yard. Pt had a syncopal episode with brief LOC, gained conciousness prior to falling to the ground. Pt blacked out driving in [id number] and was on  Keppra or Lamictal to prevent seizures but has since stopped due to vivid dreams. Pt completed 30 day heart monitor and turned it in on 04/04/19 but does not have results. Last dizziness episodes were early in Oct. 2020, in the shower he felt woozy and like he might pass out. He does have to sit EOB prior to standing to prevent dizziness. He performs 20-30 minutes of stretches for LBP in the morning and has to be mindful upon standing due to impaired balance (dizziness). Pt denied spinning sensation and walking in semi-darkness is difficult. At worst pt describes wooziness as: 7/10, at best: 2/10, current: 2/10. Pt denied N/T, tinnitus, weakness, severe HA with dizziness.    Pertinent History  HTN, postural dizziness, a-fib, torn carotid artery repaired-resulted in CVA, mitral regurgitation, carotid artery disease, dyslipidemia, chronic LBP, L vertebral artery severe stenosis also presentat C3-4 on the left due to severe facet hypertrophy.    Patient Stated Goals  Understand what the problem is, wants a solution to keep the problem at [place]    Currently in Pain?  No/denies         [facility] PT Assessment - 04/07/19 0820      Assessment   Medical Diagnosis  Vertigo    Referring Provider (PT)  Dr. Jannifer Franklin    Onset Date/Surgical Date  12/07/18    Hand Dominance  Right  Prior Therapy  none      Precautions   Precautions  Fall      Restrictions   Weight Bearing Restrictions  No      Balance Screen   Has the patient fallen in the past 6 months  Yes    How many times?  1   syncopal episode   Has the patient had a decrease in activity level because of a fear of falling?   Yes    Is the patient reluctant to leave their home because of a fear of falling?   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 no AD    Gait Comments  Noted to turn in guarded manner.            Vestibular Assessment - 04/07/19 0829      Symptom Behavior   Subjective history of current problem  See assessment    Type of Dizziness   --   woozinesss   Frequency of Dizziness  Daily    Duration of Dizziness  Minutes    Symptom Nature  Motion provoked    Aggravating Factors  Sit to stand;Supine to sit   looking down while sweeping on steps   Relieving Factors  Rest    Progression of Symptoms  Worse      Oculomotor Exam   Oculomotor Alignment  Normal    Spontaneous  Absent    Gaze-induced   Absent    Smooth Pursuits  Intact    Saccades  Intact    Comment  Pt reported a slight incr. in wooziness during R sided smooth pursuits and black spot during L sided saccades (could have been black mask on face). Pt does have intermittent blurred vision during wooziness, which improves with drinking water.       Vestibulo-Ocular Reflex   VOR 1 Head Only (x 1 viewing)  Pt reported incr. dizziness during VOR (incr. during vertical movement)    VOR  Cancellation  Normal      Positional Testing   Sidelying Test  Sidelying Right;Sidelying Left    Horizontal Canal Testing  Horizontal Canal Right;Horizontal Canal Left  Sidelying Right   Sidelying Right Duration  Sustained in this position, 4-5/10 dizziness    Sidelying Right Symptoms  No nystagmus      Sidelying Left   Sidelying Left Duration  Sustained in this position, 4-5/10 dizziness    Sidelying Left Symptoms  No nystagmus      Horizontal Canal Right   Horizontal Canal Right Duration  2-3/10 dizziness    Horizontal Canal Right Symptoms  Normal      Horizontal Canal Left   Horizontal Canal Left Duration  2-3/10 dizziness    Horizontal Canal Left Symptoms  Normal          Objective measurements completed on examination: See above findings.              PT Education - 04/07/19 1005    Education Details  PT discussed exam findings, likelihood of dizziness being multi-factorial and that orthostatic hypotension will be assessed next session. PT discussed outcome measures, POC, frequency, and duration.    Person(s) Educated  Patient    Methods  Explanation    Comprehension  Verbalized understanding       PT Short Term Goals - 04/07/19 1016      PT SHORT TERM GOAL #1   Title  same as LTGs        PT Long Term Goals - 04/07/19 1016      PT LONG TERM GOAL #1   Title  Pt will be IND in HEP to decr. dizziness and improve balance.    Time  5    Period  Weeks    Status  New      PT LONG TERM GOAL #2   Title  Perform FGA and orthostatic testing and write goals as indicated.    Time  5    Period  Weeks    Status  New      PT LONG TERM GOAL #3   Title  Pt will report dizziness has decr. to </=4/10 at worst during all positional movements.    Baseline  7/10 at worst    Time  5    Period  Weeks    Status  New             Plan - 04/07/19 1008    Clinical Impression Statement  Pt is a pleasant 73y/o male presenting to [facility]PP[facility] neuro for dizziness.  Pt's PMH is significant for the following: HTN, postural dizziness, a-fib, torn carotid artery repaired-resulted in CVA, mitral regurgitation, carotid artery disease, dyslipidemia, chronic LBP, L vertebral artery severe stenosis also presentat C3-4 on the left due to severe facet hypertrophy. Pt's gait speed was WNL during exam and indicates pt is able to safely amb. in the community. Pt's dizziness appear to be multi-factorial, as pt reported concordant wooziness/dizziness during oculomotor exam, along with s/s that appear to be consistent with potential orthostatic hypotension. Pt is also awaiting results from 30-day heart monitor and has known severe stenosis of L vertebral artery 2/2 osteophytes at C3-4, C5-6. The following deficits were noted during exam: impaired balance (especially during activities which require incr. vestibular input), dizziness, guarded gait 2/2 fear of provoking dizziness. PT did not perform Dix-Hallpike 2/2 severe stenosis of L vertebral artery. Pt would benefit from skilled PT to improve dizziness and safety during functional mobility.    Personal Factors and Comorbidities  Comorbidity 3+;Transportation;Age    Examination-Activity Limitations  Bed Mobility;Bend;Locomotion Level;Reach Overhead;Transfers    Examination-Participation Restrictions  Driving;[employer]   golf  Stability/Clinical Decision Making  Evolving/Moderate complexity    Clinical Decision Making  High    Rehab Potential  Good    PT Frequency  1x / week    PT Duration  --   5 weeks   PT Treatment/Interventions  ADLs/Self Care Home Management;Canalith Repostioning;DME Instruction;Balance training;Therapeutic exercise;Therapeutic activities;Functional mobility training;Stair training;Gait training;Neuromuscular re-education;Patient/family education;Vestibular    PT Next Visit Plan  Perform orthostatic hypotension testing and FGA. Write goals as indicated and provide balance/VOR/vestibular HEP.     Consulted and Agree with Plan of Care  Patient       Patient will benefit from skilled therapeutic intervention in order to improve the following deficits and impairments:  Abnormal gait, Impaired flexibility, Dizziness, Decreased balance, Pain(PT will monitor LBP but will not directly treat)  Visit Diagnosis: Dizziness and giddiness - Plan: PT plan of care cert/re-cert  Other abnormalities of gait and mobility - Plan: PT plan of care cert/re-cert     Problem List Patient Active Problem List   Diagnosis Date Noted  . Syncope and collapse 12/21/2017  . Paroxysmal A-fib ([place]) 06/25/2017  . HFrEF (heart failure with reduced ejection fraction) ([place]) 06/05/2017  . Chronic low back pain 05/03/2017  . History of carotid artery dissection 04/12/2016  . Carotid artery disease ([place])   . Supraventricular tachycardia ([place])   . Dyslipidemia   . Hypertension   . CVA (cerebral infarction)   . BPH (benign prostatic hyperplasia)   . Hematuria   . Postural dizziness with near syncope   . Ejection fraction   . Mitral regurgitation   . Chest discomfort     Miller,Jennifer L 04/07/2019, 10:20 AM  [facility] [address] [place], [place], [address] Phone: [phone]   Fax:  [phone]  Name: Billy Carpenter MRN: [id number] Date of Birth: 25-Jun-1945  Geoffry Paradise, PT,DPT 04/07/19 10:20 AM Phone: [phone] Fax: [phone]

## 2019-04-16 ENCOUNTER — Encounter: Payer: Self-pay | Admitting: Physical Therapy

## 2019-04-16 ENCOUNTER — Ambulatory Visit: Payer: Medicare Other | Admitting: Physical Therapy

## 2019-04-16 ENCOUNTER — Other Ambulatory Visit: Payer: Self-pay

## 2019-04-16 DIAGNOSIS — R42 Dizziness and giddiness: Secondary | ICD-10-CM

## 2019-04-16 DIAGNOSIS — R2689 Other abnormalities of gait and mobility: Secondary | ICD-10-CM | POA: Diagnosis not present

## 2019-04-16 NOTE — Patient Instructions (Signed)
Feet Together, Head Motion - Eyes Closed      With eyes closed and feet together.  Focus on keeping the weight over the middle of your feet.  Hold for 30 seconds.   Can also move one foot slightly forwards and repeat with each foot forwards for a greater balance challenge. Repeat 3 times per session. Do 2 sessions per day.

## 2019-04-16 NOTE — Therapy (Signed)
[facility] [address] [place], [place], [address] Phone: [phone]   Fax:  [phone]  Physical Therapy Treatment  Patient Details  Name: Billy Carpenter MRN: [id number] Date of Birth: [birth year] Referring Provider (PT): Dr. Jannifer Franklin   Encounter Date: 04/16/2019  PT End of Session - 04/16/19 1444    Visit Number  2    Number of Visits  5    Date for PT Re-Evaluation  06/06/19    Authorization Type  Medicare and BCBS (progress note every 10th visit)    PT Start Time  0717    PT Stop Time  0805    PT Time Calculation (min)  48 min    Activity Tolerance  Patient tolerated treatment well    Behavior During Therapy  [facility] for tasks assessed/performed       Past Medical History:  Diagnosis Date  . BPH (benign prostatic hyperplasia)   . Carotid artery disease ([place])    Left internal carotid artery spontaneous dissection in the past  //   Doppler, October, 2013, minimal plaque, 0-39% bilateral  . Chronic low back pain 05/03/2017  . CVA (cerebral infarction)    left insular cortex stroke with left internal carotid artery dissection and near occlusion  . Dyslipidemia   . Hematuria   . Hypertension   . Mitral regurgitation    mild, echo, March, 2010  . Persistent atrial fibrillation ([place])   . Syncope   . Syncope and collapse 12/21/2017    Past Surgical History:  Procedure Laterality Date  . BLADDER STONE REMOVAL    . LEFT HEART CATH AND CORONARY ANGIOGRAPHY N/A 06/27/2017   Procedure: LEFT HEART CATH AND CORONARY ANGIOGRAPHY;  Surgeon: Belva Crome, MD;  Location: [facility];  Service: Cardiovascular;  Laterality: N/A;  . PROSTATE SURGERY  2009  . WRIST SURGERY     Right    There were no vitals filed for this visit.  Subjective Assessment - 04/16/19 0718    Subjective  Still hasn't heard results from heart monitor.  Has been reading about issues with blood flow in the neck and ligament instability.   Thought Anderson Malta was going to give him some exercises to work on.    Pertinent History  HTN, postural dizziness, a-fib, torn carotid artery repaired-resulted in CVA, mitral regurgitation, carotid artery disease, dyslipidemia, chronic LBP, L vertebral artery severe stenosis also presentat C3-4 on the left due to severe facet hypertrophy.    Patient Stated Goals  Understand what the problem is, wants a solution to keep the problem at [place], to be able to keep playing golf    Currently in Pain?  No/denies         [facility] PT Assessment - 04/16/19 0728      Functional Gait  Assessment   Gait assessed   Yes    Gait Level Surface  Walks 20 ft in less than 5.5 sec, no assistive devices, good speed, no evidence for imbalance, normal gait pattern, deviates no more than 6 in outside of the 12 in walkway width.    Change in Gait Speed  Able to smoothly change walking speed without loss of balance or gait deviation. Deviate no more than 6 in outside of the 12 in walkway width.    Gait with Horizontal Head Turns  Performs head turns smoothly with slight change in gait velocity (eg, minor disruption to smooth gait path), deviates 6-10 in outside 12 in walkway width, or  uses an assistive device.    Gait with Vertical Head Turns  Performs task with slight change in gait velocity (eg, minor disruption to smooth gait path), deviates 6 - 10 in outside 12 in walkway width or uses assistive device    Gait and Pivot Turn  Pivot turns safely within 3 sec and stops quickly with no loss of balance.    Step Over Obstacle  Is able to step over 2 stacked shoe boxes taped together (9 in total height) without changing gait speed. No evidence of imbalance.    Gait with Narrow Base of Support  Is able to ambulate for 10 steps heel to toe with no staggering.    Gait with Eyes Closed  Walks 20 ft, uses assistive device, slower speed, mild gait deviations, deviates 6-10 in outside 12 in walkway width. Ambulates 20 ft in less than 9 sec  but greater than 7 sec.    Ambulating Backwards  Walks 20 ft, slow speed, abnormal gait pattern, evidence for imbalance, deviates 10-15 in outside 12 in walkway width.    Steps  Alternating feet, no rail.    Total Score  25    FGA comment:  25/30         Vestibular Assessment - 04/16/19 0732      Positional Sensitivities   Sit to Supine  Lightheadedness    Supine to Sitting  Mild dizziness      Orthostatics   BP supine (x 5 minutes)  122/76   mild wooziness   HR supine (x 5 minutes)  66    BP sitting  120/86    HR sitting  74    BP standing (after 1 minute)  114/83    HR standing (after 1 minute)  78    BP standing (after 3 minutes)  117/83    HR standing (after 3 minutes)  79    Orthostatics Comment  mild sense of wooziness in each position                    Balance Exercises - 04/16/19 1439      Balance Exercises: Standing   Standing Eyes Closed  Narrow base of support (BOS);Solid surface;30 secs   focus on keeping COG over BOS        PT Education - 04/16/19 1439    Education Details  discussed symptoms of vertebrobasilar insufficiency and how changes in cervical bone structure can lead to vascular insufficiency.  Discussed how definitive diagnosis of vertebrobasilar insufficiency is determined by physician and treatment is determined by physician but due to symptoms being concurrent with vascular insufficiency therapy will educate on safety and will avoid extremes of neck rotation and extension ROM.  Encouraged pt to discuss further with physician.    Person(s) Educated  Patient    Methods  Explanation    Comprehension  Verbalized understanding       PT Short Term Goals - 04/07/19 1016      PT SHORT TERM GOAL #1   Title  same as LTGs        PT Long Term Goals - 04/16/19 1457      PT LONG TERM GOAL #1   Title  Pt will be IND in HEP to decr. dizziness and improve balance.    Time  5    Period  Weeks    Status  New    Target Date  06/06/19       PT LONG TERM GOAL #  2   Title  Pt will improve FGA by 2 points to indicate improved safety and balance during gait in the community    Baseline  25/30    Time  5    Period  Weeks    Status  New    Target Date  06/06/19      PT LONG TERM GOAL #3   Title  Pt will report dizziness has decr. to </=4/10 at worst during all positional movements.  (MSQ)    Baseline  7/10 at worst    Time  5    Period  Weeks    Status  New    Target Date  06/06/19      PT LONG TERM GOAL #4   Title  Pt will demonstrate ability to swing and hit golf balls while maintaining neutral neck position to minimize risk for dizziness and drop attacks.    Time  5    Period  Weeks    Status  New    Target Date  06/06/19            Plan - 04/16/19 1445    Clinical Impression Statement  Continued assessment of symptoms with assessment of orthostatic hypotension with no evidence of hypotension when transitioning from supine > sit > stand.  Pt did experience mild lightheadedness but it did not change significantly with changes in position.  No significant changes in BP.  Continued to assess balance deficits with pt experiencing greatest LOB and symptoms with head turns, head nods, and when vision removed.  Initiated standing balance training and discussed safety with head and neck positions.  Will continue to address and progress towards LTG.    Personal Factors and Comorbidities  Comorbidity 3+;Transportation;Age    Examination-Activity Limitations  Bed Mobility;Bend;Locomotion Level;Reach Overhead;Transfers    Examination-Participation Restrictions  Driving;Yard Work;Other   golf   Stability/Clinical Decision Making  Evolving/Moderate complexity    Rehab Potential  Good    PT Frequency  1x / week    PT Duration  --   5 weeks   PT Treatment/Interventions  ADLs/Self Care Home Management;Canalith Repostioning;DME Instruction;Balance training;Therapeutic exercise;Therapeutic activities;Functional mobility  training;Stair training;Gait training;Neuromuscular re-education;Patient/family education;Vestibular    PT Next Visit Plan  Continue to add to HEP for balance; review how pt can perform golf with less neck rotation and extension.  Balance training but avoid end range rotation and extension due to likely vertebrobasilar insufficiency.    Consulted and Agree with Plan of Care  Patient       Patient will benefit from skilled therapeutic intervention in order to improve the following deficits and impairments:  Abnormal gait, Impaired flexibility, Dizziness, Decreased balance, Pain(PT will monitor LBP but will not directly treat)  Visit Diagnosis: Dizziness and giddiness  Other abnormalities of gait and mobility     Problem List Patient Active Problem List   Diagnosis Date Noted  . Syncope and collapse 12/21/2017  . Paroxysmal A-fib ([place]) 06/25/2017  . HFrEF (heart failure with reduced ejection fraction) ([place]) 06/05/2017  . Chronic low back pain 05/03/2017  . History of carotid artery dissection 04/12/2016  . Carotid artery disease ([place])   . Supraventricular tachycardia ([place])   . Dyslipidemia   . Hypertension   . CVA (cerebral infarction)   . BPH (benign prostatic hyperplasia)   . Hematuria   . Postural dizziness with near syncope   . Ejection fraction   . Mitral regurgitation   . Chest discomfort     Letta Moynahan  Fritz Pickerel, PT, DPT 04/16/19    3:02 PM    [facility] [address] [place], [place], [address] Phone: [phone]   Fax:  [phone]  Name: Billy Carpenter MRN: [id number] Date of Birth: 23-Nov-1945

## 2019-04-19 ENCOUNTER — Telehealth: Payer: Self-pay | Admitting: Neurology

## 2019-04-19 DIAGNOSIS — R55 Syncope and collapse: Secondary | ICD-10-CM

## 2019-04-19 NOTE — Telephone Encounter (Signed)
I called and left a message, cardiac study shows ventricular tachycardia, however Dr. Caryl Comes is recommending further cardiac evaluation with 2D echocardiogram and possibly a loop recorder placement.  If the patient desires to have a cardiology evaluation, I will try to get this set up.  The patient is to call me back regarding this.   Cardiac monitor study:  Indication:syncope  Duration: 30d  Findings PVCs 1%  PACs   VT Nonsustained   episodes; fastest 163 bpm for 7 beats;   Pt symptoms correlated with PACs HR    Min 45-Max 123    With hx of syncope and cardiomyopathy would consider repeat echo and possible further eval including implantable loop

## 2019-04-19 NOTE — Telephone Encounter (Signed)
-----   Message from Will Meredith Leeds, MD sent at 04/18/2019  9:47 AM EST ----- Will get him into clinic to discuss, thanks. ----- Message ----- From: Deboraha Sprang, MD Sent: 04/16/2019   3:02 PM EST To: Kathrynn Ducking, MD, Will Meredith Leeds, MD  Lanny Hurst   good morning   I read your July note and was assigned to read the event recorder  His syncope w abrupt onset and offset is concerning for arrhythmia, esp as he has a history ( sort of ) of cardiomyopathy with EF ranging from 30-50%  I would be glad to see him or we canget him back to [facility] but think he probably needs echo and either a loop or something more to try and help with this  Thanks steve

## 2019-04-21 ENCOUNTER — Telehealth: Payer: Self-pay | Admitting: *Deleted

## 2019-04-21 NOTE — Telephone Encounter (Signed)
Pt returned call , please call back   °

## 2019-04-21 NOTE — Addendum Note (Signed)
Addended by: Kathrynn Ducking on: 04/21/2019 11:07 AM   Modules accepted: Orders

## 2019-04-21 NOTE — Telephone Encounter (Signed)
-----   Message from Alben Spittle sent at 04/18/2019  2:07 PM EST ----- I called Mr. Bras. He said that he hasn't seen Dr. Curt Bears in quite some time and he decided a while back to follow up with a doctor at [facility]. He said he will follow up with that doctor. He is waiting on the report for his heart monitor that he requested through Dr. Jannifer Franklin office. ----- Message ----- From: Stanton Kidney, RN Sent: 04/18/2019  11:31 AM EST To: Stanton Kidney, RN, Alben Spittle   ----- Message ----- From: Constance Haw, MD Sent: 04/18/2019   9:47 AM EST To: Stanton Kidney, RN  Lets have him follow up in clinic, thanks. ----- Message ----- From: Deboraha Sprang, MD Sent: 04/16/2019   3:02 PM EST To: Kathrynn Ducking, MD, Will Meredith Leeds, MD  Lanny Hurst   good morning   I read your July note and was assigned to read the event recorder  His syncope w abrupt onset and offset is concerning for arrhythmia, esp as he has a history ( sort of ) of cardiomyopathy with EF ranging from 30-50%  I would be glad to see him or we canget him back to [facility] but think he probably needs echo and either a loop or something more to try and help with this  Thanks steve

## 2019-04-21 NOTE — Telephone Encounter (Signed)
I called the patient.  The cardiologist recommended further evaluation, I will make a referral.  The patient still having events of vertigo unassociated with syncope, vestibular rehab has not helped so far.  The question is whether or not the left vertebral artery compression has anything to do with his symptoms, the right vertebral artery is widely patent.

## 2019-04-21 NOTE — Telephone Encounter (Signed)
Follow up   Dr. Curt Bears gave his okay for patient to switch. But Dr. Caryl Comes has not. Does Dr. Caryl Comes okay the switch okay seeing this patient.

## 2019-04-23 ENCOUNTER — Ambulatory Visit: Payer: Medicare Other

## 2019-04-23 ENCOUNTER — Other Ambulatory Visit: Payer: Self-pay

## 2019-04-23 ENCOUNTER — Encounter: Payer: Self-pay | Admitting: Internal Medicine

## 2019-04-23 ENCOUNTER — Ambulatory Visit (INDEPENDENT_AMBULATORY_CARE_PROVIDER_SITE_OTHER): Payer: Medicare Other | Admitting: Internal Medicine

## 2019-04-23 VITALS — BP 126/82 | HR 65 | Ht 68.0 in | Wt 156.4 lb

## 2019-04-23 VITALS — BP 140/92 | HR 81

## 2019-04-23 DIAGNOSIS — R2689 Other abnormalities of gait and mobility: Secondary | ICD-10-CM | POA: Diagnosis not present

## 2019-04-23 DIAGNOSIS — I428 Other cardiomyopathies: Secondary | ICD-10-CM | POA: Diagnosis not present

## 2019-04-23 DIAGNOSIS — R42 Dizziness and giddiness: Secondary | ICD-10-CM

## 2019-04-23 NOTE — Patient Instructions (Signed)
Orthostatic Hypotension °Blood pressure is a measurement of how strongly, or weakly, your blood is pressing against the walls of your arteries. Orthostatic hypotension is a sudden drop in blood pressure that happens when you quickly change positions, such as when you get up from sitting or lying down. °Arteries are blood vessels that carry blood from your heart throughout your body. When blood pressure is too low, you may not get enough blood to your brain or to the rest of your organs. This can cause weakness, light-headedness, rapid heartbeat, and fainting. This can last for just a few seconds or for up to a few minutes. Orthostatic hypotension is usually not a serious problem. However, if it happens frequently or gets worse, it may be a sign of something more serious. °What are the causes? °This condition may be caused by: °· Sudden changes in posture, such as standing up quickly after you have been sitting or lying down. °· Blood loss. °· Loss of body fluids (dehydration). °· Heart problems. °· Hormone (endocrine) problems. °· Pregnancy. °· Severe infection. °· Lack of certain nutrients. °· Severe allergic reactions (anaphylaxis). °· Certain medicines, such as blood pressure medicine or medicines that make the body lose excess fluids (diuretics). Sometimes, this condition can be caused by not taking medicine as directed, such as taking too much of a certain medicine. °What increases the risk? °The following factors may make you more likely to develop this condition: °· Age. Risk increases as you get older. °· Conditions that affect the heart or the central nervous system. °· Taking certain medicines, such as blood pressure medicine or diuretics. °· Being pregnant. °What are the signs or symptoms? °Symptoms of this condition may include: °· Weakness. °· Light-headedness. °· Dizziness. °· Blurred vision. °· Fatigue. °· Rapid heartbeat. °· Fainting, in severe cases. °How is this diagnosed? °This condition is  diagnosed based on: °· Your medical history. °· Your symptoms. °· Your blood pressure measurement. Your health care provider will check your blood pressure when you are: °? Lying down. °? Sitting. °? Standing. °A blood pressure reading is recorded as two numbers, such as "120 over 80" (or 120/80). The first ("top") number is called the systolic pressure. It is a measure of the pressure in your arteries as your heart beats. The second ("bottom") number is called the diastolic pressure. It is a measure of the pressure in your arteries when your heart relaxes between beats. Blood pressure is measured in a unit called mm Hg. Healthy blood pressure for most adults is 120/80. If your blood pressure is below 90/60, you may be diagnosed with hypotension. °Other information or tests that may be used to diagnose orthostatic hypotension include: °· Your other vital signs, such as your heart rate and temperature. °· Blood tests. °· Tilt table test. For this test, you will be safely secured to a table that moves you from a lying position to an upright position. Your heart rhythm and blood pressure will be monitored during the test. °How is this treated? °This condition may be treated by: °· Changing your diet. This may involve eating more salt (sodium) or drinking more water. °· Taking medicines to raise your blood pressure. °· Changing the dosage of certain medicines you are taking that might be lowering your blood pressure. °· Wearing compression stockings. These stockings help to prevent blood clots and reduce swelling in your legs. °In some cases, you may need to go to the hospital for: °· Fluid replacement. This means you will   receive fluids through an IV. °· Blood replacement. This means you will receive donated blood through an IV (transfusion). °· Treating an infection or heart problems, if this applies. °· Monitoring. You may need to be monitored while medicines that you are taking wear off. °Follow these instructions  at home: °Eating and drinking ° °· Drink enough fluid to keep your urine pale yellow. °· Eat a healthy diet, and follow instructions from your health care provider about eating or drinking restrictions. A healthy diet includes: °? Fresh fruits and vegetables. °? Whole grains. °? Lean meats. °? Low-fat dairy products. °· Eat extra salt only as directed. Do not add extra salt to your diet unless your health care provider told you to do that. °· Eat frequent, small meals. °· Avoid standing up suddenly after eating. °Medicines °· Take over-the-counter and prescription medicines only as told by your health care provider. °? Follow instructions from your health care provider about changing the dosage of your current medicines, if this applies. °? Do not stop or adjust any of your medicines on your own. °General instructions ° °· Wear compression stockings as told by your health care provider. °· Get up slowly from lying down or sitting positions. This gives your blood pressure a chance to adjust. °· Avoid hot showers and excessive heat as directed by your health care provider. °· Return to your normal activities as told by your health care provider. Ask your health care provider what activities are safe for you. °· Do not use any products that contain nicotine or tobacco, such as cigarettes, e-cigarettes, and chewing tobacco. If you need help quitting, ask your health care provider. °· Keep all follow-up visits as told by your health care provider. This is important. °Contact a health care provider if you: °· Vomit. °· Have diarrhea. °· Have a fever for more than 2-3 days. °· Feel more thirsty than usual. °· Feel weak and tired. °Get help right away if you: °· Have chest pain. °· Have a fast or irregular heartbeat. °· Develop numbness in any part of your body. °· Cannot move your arms or your legs. °· Have trouble speaking. °· Become sweaty or feel light-headed. °· Faint. °· Feel short of breath. °· Have trouble staying  awake. °· Feel confused. °Summary °· Orthostatic hypotension is a sudden drop in blood pressure that happens when you quickly change positions. °· Orthostatic hypotension is usually not a serious problem. °· It is diagnosed by having your blood pressure taken lying down, sitting, and then standing. °· It may be treated by changing your diet or adjusting your medicines. °This information is not intended to replace advice given to you by your health care provider. Make sure you discuss any questions you have with your health care provider. °Document Released: 05/05/2002 Document Revised: 11/08/2017 Document Reviewed: 11/08/2017 °Elsevier Patient Education © 2020 [employer] ° °

## 2019-04-23 NOTE — Telephone Encounter (Signed)
I would be glad to see him
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
## 2019-04-23 NOTE — Patient Instructions (Addendum)
Medication Instructions:  Your physician recommends that you continue on your current medications as directed. Please refer to the Current Medication list given to you today.  Labwork: None Needed  Testing/Procedures: Your physician has requested that you have an echocardiogram on 05/05/19 at 7:20 AM. Please arrive at 7:05 AM. Echocardiography is a painless test that uses sound waves to create images of your heart. It provides your doctor with information about the size and shape of your heart and how well your heart's chambers and valves are working. This procedure takes approximately one hour. There are no restrictions for this procedure.  Your physician has recommended that you have a implantable loop recorder inserted on 05/09/19 at 12:00 PM in the office with Dr. Caryl Comes. A pacemaker is a small device that is placed under the skin of your chest or abdomen to help control abnormal heart rhythms. This device uses electrical pulses to prompt the heart to beat at a normal rate. Pacemakers are used to treat heart rhythms that are too slow. Wires (leads) are attached to the pacemaker that goes into the chambers of your heart. This is done in the hospital and usually requires an overnight stay. Please see the instruction sheet given to you today for more information.  Follow-Up: We will arrange a 7-10 day wound check with the device clinic after your implant  Any Other Special Instructions Will Be Listed Below (If Applicable).  You may have a light breakfast the morning of the procedure  You may take all of your medications the morning of the procedure  Wash chest & neck area with the antibacterial soap/surgical scrub the night before and the morning of the procedure    Implantable Loop Recorder Placement  An implantable loop recorder is a small electronic device that is placed under the skin of your chest. It is about the size of an AA ("double A") battery. The device records the electrical  activity of your heart over a long period of time. Your health care provider can download these recordings to monitor your heart. You may need an implantable loop recorder if you have periods of abnormal heart activity (arrhythmias) or unexplained fainting (syncope). The recorder can be left in place for 1 year or longer. Tell a health care provider about:  Any allergies you have.  All medicines you are taking, including vitamins, herbs, eye drops, creams, and over-the-counter medicines.  Any problems you or family members have had with anesthetic medicines.  Any blood disorders you have.  Any surgeries you have had.  Any medical conditions you have.  Whether you are pregnant or may be pregnant. What are the risks? Generally, this is a safe procedure. However, problems may occur, including:  Infection.  Bleeding.  Allergic reactions to anesthetic medicines.  Damage to nerves or blood vessels.  Failure of the device to work. This could require another surgery to replace it. What happens before the procedure?   You may have a physical exam, blood tests, and imaging tests of your heart, such as a chest X-ray.  Follow instructions from your health care provider about eating or drinking restrictions.  Ask your health care provider about: ? Changing or stopping your regular medicines. This is especially important if you are taking diabetes medicines or blood thinners. ? Taking medicines such as aspirin and ibuprofen. These medicines can thin your blood. Do not take these medicines unless your health care provider tells you to take them. ? Taking over-the-counter medicines, vitamins, herbs, and supplements.  Ask your health care provider how your surgical site will be marked or identified.  Ask your health care provider what steps will be taken to help prevent infection. These may include: ? Removing hair at the surgery site. ? Washing skin with a germ-killing soap.  Plan to  have someone take you home from the hospital or clinic.  Plan to have a responsible adult care for you for at least 24 hours after you leave the hospital or clinic. This is important.  Do not use any products that contain nicotine or tobacco, such as cigarettes and e-cigarettes. If you need help quitting, ask your health care provider. What happens during the procedure?  An IV will be inserted into one of your veins.  You may be given one or more of the following: ? A medicine to help you relax (sedative). ? A medicine to numb the area (local anesthetic).  A small incision will be made on the left side of your upper chest.  A pocket will be created under your skin.  The device will be placed in the pocket.  The incision will be closed with stitches (sutures) or adhesive strips.  A bandage (dressing) will be placed over the incision. The procedure may vary among health care providers and hospitals. What happens after the procedure?  Your blood pressure, heart rate, breathing rate, and blood oxygen level will be monitored until you leave the hospital or clinic.  You may be able to go home on the day of your surgery. Before you go home: ? Your health care provider will program your recorder. ? You will learn how to trigger your device with a handheld activator. ? You will learn how to send recordings to your health care provider. ? You will get an ID card for your device, and you will be told when to use it.  Do not drive for 24 hours if you were given a sedative during your procedure. Summary  An implantable loop recorder is a small electronic device that is placed under the skin of your chest to monitor your heart over a long period of time.  The recorder can be left in place for 1 year or longer.  Plan to have someone take you home from the hospital or clinic. This information is not intended to replace advice given to you by your health care provider. Make sure you discuss  any questions you have with your health care provider. Document Released: 04/26/2015 Document Revised: 07/19/2017 Document Reviewed: 06/30/2017 Elsevier Patient Education  2020 [employer].

## 2019-04-23 NOTE — Therapy (Signed)
[facility] [address] [place], [place], [address] Phone: [phone]   Fax:  [phone]  Physical Therapy Treatment  Patient Details  Name: Billy Carpenter MRN: [id number] Date of Birth: [birth year] Referring Provider (PT): Dr. Jannifer Franklin   Encounter Date: 04/23/2019  PT End of Session - 04/23/19 0827    Visit Number  3    Number of Visits  5    Date for PT Re-Evaluation  06/06/19    Authorization Type  Medicare and BCBS (progress note every 10th visit)    PT Start Time  0806   pt late   PT Stop Time  0845    PT Time Calculation (min)  39 min    Equipment Utilized During Treatment  Other (comment)   min guard prn   Activity Tolerance  Patient tolerated treatment well    Behavior During Therapy  [facility] for tasks assessed/performed       Past Medical History:  Diagnosis Date  . BPH (benign prostatic hyperplasia)   . Carotid artery disease ([place])    Left internal carotid artery spontaneous dissection in the past  //   Doppler, October, 2013, minimal plaque, 0-39% bilateral  . Chronic low back pain 05/03/2017  . CVA (cerebral infarction)    left insular cortex stroke with left internal carotid artery dissection and near occlusion  . Dyslipidemia   . Hematuria   . Hypertension   . Mitral regurgitation    mild, echo, March, 2010  . Persistent atrial fibrillation ([place])   . Syncope   . Syncope and collapse 12/21/2017    Past Surgical History:  Procedure Laterality Date  . BLADDER STONE REMOVAL    . LEFT HEART CATH AND CORONARY ANGIOGRAPHY N/A 06/27/2017   Procedure: LEFT HEART CATH AND CORONARY ANGIOGRAPHY;  Surgeon: Belva Crome, MD;  Location: [facility];  Service: Cardiovascular;  Laterality: N/A;  . PROSTATE SURGERY  2009  . WRIST SURGERY     Right    Vitals:   04/23/19 0829 04/23/19 0830 04/23/19 0836  BP: (!) 143/87 124/80 (!) 140/92  Pulse: 72 76 81    Subjective Assessment - 04/23/19 0807     Subjective  Pt brought in heart monitor results: PVC-1%, VT nonsustained: 163bpm for 7 beats, pt symptoms correlated with PACs, pt hx of syncope and cardiomyopathy would consider repeat echo and possible further eval including implantable loop recorder. Dr. Jannifer Franklin placed referral for loop recorder. Has an appt. with Dr. Jannifer Franklin on 05/06/19. Pt had an episode of dizziness on 04/20/19 after performing LBP exercises on floor (felt dizzy upon standing).    Pertinent History  HTN, postural dizziness, a-fib, torn carotid artery repaired-resulted in CVA, mitral regurgitation, carotid artery disease, dyslipidemia, chronic LBP, L vertebral artery severe stenosis also presentat C3-4 on the left due to severe facet hypertrophy.    Patient Stated Goals  Understand what the problem is, wants a solution to keep the problem at [place], to be able to keep playing golf    Currently in Pain?  No/denies          Therex: performed in supine but performed only approx. 20 reps of each exercise. -Double knee to chest x100 reps -100 reps of crunches with an "ab bar" in supine -Open book (trunk rotation) in Right sidelying 10 reps with 25 second hold (rotate to the left) head is looking straight ahead. Constance Haw with feet together with 25 second hold x10 reps -B hamstring stretches  with 60 second hold x3 reps/LE. Cues to extend knee during stretch, using towel.  Pt practiced golf swing with squaring hips/shoulders vs. Performing Cx spine rotation and ext. To follow through with swing. x5 reps. Performed with demo and cues for proper and safe technique, with S for safety.  BP assessed upon sitting and then standing as pt reported concordant dizziness.                    Self Care: PT Education - 04/23/19 0827    Education Details  PT educated pt on the importance of f/u with MD re: s/s of dizziness consistent with VBI and orthostatic hypotension. PT provided cues for HEP and ways to decr. dizziness. PT  also provided pt with orthostasis handout (and reviewed with pt).    Person(s) Educated  Patient    Methods  Explanation;Demonstration;Tactile cues;Verbal cues    Comprehension  Returned demonstration;Verbalized understanding       PT Short Term Goals - 04/07/19 1016      PT SHORT TERM GOAL #1   Title  same as LTGs        PT Long Term Goals - 04/16/19 1457      PT LONG TERM GOAL #1   Title  Pt will be IND in HEP to decr. dizziness and improve balance.    Time  5    Period  Weeks    Status  New    Target Date  06/06/19      PT LONG TERM GOAL #2   Title  Pt will improve FGA by 2 points to indicate improved safety and balance during gait in the community    Baseline  25/30    Time  5    Period  Weeks    Status  New    Target Date  06/06/19      PT LONG TERM GOAL #3   Title  Pt will report dizziness has decr. to </=4/10 at worst during all positional movements.  (MSQ)    Baseline  7/10 at worst    Time  5    Period  Weeks    Status  New    Target Date  06/06/19      PT LONG TERM GOAL #4   Title  Pt will demonstrate ability to swing and hit golf balls while maintaining neutral neck position to minimize risk for dizziness and drop attacks.    Time  5    Period  Weeks    Status  New    Target Date  06/06/19            Plan - 04/23/19 [id number]    Clinical Impression Statement  PT had pt perform morning LBP exercises as pt reported reoccurence of concordant dizziness after performing exercises at home. Pt's BP did decr. during sit to stand after activity and pt reported concordant dizzines, which decr. with maintainting seated and standing positions upon txf. PT encouraged pt to continue HEP and to discuss s/s with MD at next visit. Pt able to successfully perform modified golf swing to decr. Cx spine rotation and extension to decr. dizziness. Pt would continue to benefit from skilled PT to improve balance and safety during funcitonal mobility.    Personal Factors and  Comorbidities  Comorbidity 3+;Transportation;Age    Examination-Activity Limitations  Bed Mobility;Bend;Locomotion Level;Reach Overhead;Transfers    Examination-Participation Restrictions  Driving;Yard Work;Other   golf   Stability/Clinical Decision Making  Evolving/Moderate complexity  Rehab Potential  Good    PT Frequency  1x / week    PT Duration  --   5 weeks   PT Treatment/Interventions  ADLs/Self Care Home Management;Canalith Repostioning;DME Instruction;Balance training;Therapeutic exercise;Therapeutic activities;Functional mobility training;Stair training;Gait training;Neuromuscular re-education;Patient/family education;Vestibular    PT Next Visit Plan  Continue to add to HEP for balance; review (as needed) how pt can perform golf with less neck rotation and extension.  Balance training but avoid end range rotation and extension due to likely vertebrobasilar insufficiency.    Consulted and Agree with Plan of Care  Patient       Patient will benefit from skilled therapeutic intervention in order to improve the following deficits and impairments:  Abnormal gait, Impaired flexibility, Dizziness, Decreased balance, Pain(PT will monitor LBP but will not directly treat)  Visit Diagnosis: Dizziness and giddiness  Other abnormalities of gait and mobility     Problem List Patient Active Problem List   Diagnosis Date Noted  . Syncope and collapse 12/21/2017  . Paroxysmal A-fib ([place]) 06/25/2017  . HFrEF (heart failure with reduced ejection fraction) ([place]) 06/05/2017  . Chronic low back pain 05/03/2017  . History of carotid artery dissection 04/12/2016  . Carotid artery disease ([place])   . Supraventricular tachycardia ([place])   . Dyslipidemia   . Hypertension   . CVA (cerebral infarction)   . BPH (benign prostatic hyperplasia)   . Hematuria   . Postural dizziness with near syncope   . Ejection fraction   . Mitral regurgitation   . Chest discomfort     Darbie Biancardi  L 04/23/2019, 9:42 AM  [facility] [address] [place], [place], [address] Phone: [phone]   Fax:  [phone]  Name: KINDLE CARVER MRN: [id number] Date of Birth: [birth year]  Geoffry Paradise, PT,DPT 04/23/19 9:44 AM Phone: [phone] Fax: [phone]

## 2019-04-29 DIAGNOSIS — I639 Cerebral infarction, unspecified: Secondary | ICD-10-CM

## 2019-04-29 HISTORY — DX: Cerebral infarction, unspecified: I63.9

## 2019-04-29 NOTE — Telephone Encounter (Signed)
It is unusual for bony compression alone to be symptomatic  And nondominantvessel to be symptomatic. I recommend diagnostic catheter angio to get a more accurate picture prior to deciding on aggressive traetment

## 2019-05-01 ENCOUNTER — Other Ambulatory Visit: Payer: Self-pay

## 2019-05-01 ENCOUNTER — Ambulatory Visit: Payer: Medicare Other | Attending: Neurology | Admitting: Physical Therapy

## 2019-05-01 ENCOUNTER — Encounter: Payer: Self-pay | Admitting: Physical Therapy

## 2019-05-01 DIAGNOSIS — R42 Dizziness and giddiness: Secondary | ICD-10-CM | POA: Insufficient documentation

## 2019-05-01 DIAGNOSIS — R2689 Other abnormalities of gait and mobility: Secondary | ICD-10-CM

## 2019-05-01 NOTE — Therapy (Signed)
[facility] [address] [place], [place], [address] Phone: [phone]   Fax:  [phone]  Physical Therapy Treatment and D/C Summary  Patient Details  Name: Billy Carpenter MRN: [id number] Date of Birth: 06/01/1945 Referring Provider (PT): Dr. Jannifer Franklin   Encounter Date: 05/01/2019  PT End of Session - 05/01/19 0833    Visit Number  4    Number of Visits  5    Date for PT Re-Evaluation  06/06/19    Authorization Type  Medicare and BCBS (progress note every 10th visit)    PT Start Time  0806    PT Stop Time  0830    PT Time Calculation (min)  24 min    Activity Tolerance  Patient tolerated treatment well    Behavior During Therapy  [facility] for tasks assessed/performed       Past Medical History:  Diagnosis Date  . BPH (benign prostatic hyperplasia)   . Carotid artery disease ([place])    Left internal carotid artery spontaneous dissection in the past  //   Doppler, October, 2013, minimal plaque, 0-39% bilateral  . Chronic low back pain 05/03/2017  . CVA (cerebral infarction)    left insular cortex stroke with left internal carotid artery dissection and near occlusion  . Dyslipidemia   . Hematuria   . Hypertension   . Mitral regurgitation    mild, echo, March, 2010  . Persistent atrial fibrillation ([place])   . Syncope   . Syncope and collapse 12/21/2017    Past Surgical History:  Procedure Laterality Date  . BLADDER STONE REMOVAL    . LEFT HEART CATH AND CORONARY ANGIOGRAPHY N/A 06/27/2017   Procedure: LEFT HEART CATH AND CORONARY ANGIOGRAPHY;  Surgeon: Belva Crome, MD;  Location: [facility];  Service: Cardiovascular;  Laterality: N/A;  . PROSTATE SURGERY  2009  . WRIST SURGERY     Right    There were no vitals filed for this visit.  Subjective Assessment - 05/01/19 0813    Subjective  Had appointment with cardiologist - is going to have a follow up echocardiogram, treadmill test and loop recorder  inserted.  Pt would like to have his last PT note routed to Dr. Jannifer Franklin and Dr. Caryl Comes.  Continues to record his BP before and after exercises at home and gets inconsistent readings.    Pertinent History  HTN, postural dizziness, a-fib, torn carotid artery repaired-resulted in CVA, mitral regurgitation, carotid artery disease, dyslipidemia, chronic LBP, L vertebral artery severe stenosis also presentat C3-4 on the left due to severe facet hypertrophy.    Patient Stated Goals  Understand what the problem is, wants a solution to keep the problem at [place], to be able to keep playing golf    Currently in Pain?  No/denies          PHYSICAL THERAPY DISCHARGE SUMMARY  Visits from Start of Care: 4  Current functional level related to goals / functional outcomes: LTG deferred - following consultation with cardiology it has been determined that patient's symptoms are cardiac/vascular in nature and outside the PT scope of practice.  Pt to continue to follow up with physicians for treatment and management.  Pt agreeable to D/C today.   Remaining deficits: Lightheadedness   Education / Equipment: Balance HEP  Plan: Patient agrees to discharge.  Patient goals were not met. Patient is being discharged due to a change in medical status.  ?????       PT Education -  05/01/19 2595    Education Details  D/C today due to lack of vestibular impairment PT can effectively treat    Person(s) Educated  Patient    Methods  Explanation    Comprehension  Verbalized understanding       PT Short Term Goals - 04/07/19 1016      PT SHORT TERM GOAL #1   Title  same as LTGs        PT Long Term Goals - 05/01/19 0835      PT LONG TERM GOAL #1   Title  Pt will be IND in HEP to decr. dizziness and improve balance.    Time  5    Period  Weeks    Status  Deferred      PT LONG TERM GOAL #2   Title  Pt will improve FGA by 2 points to indicate improved safety and balance during gait in the community     Baseline  25/30    Time  5    Period  Weeks    Status  Deferred      PT LONG TERM GOAL #3   Title  Pt will report dizziness has decr. to </=4/10 at worst during all positional movements.  (MSQ)    Baseline  7/10 at worst    Time  5    Period  Weeks    Status  Deferred      PT LONG TERM GOAL #4   Title  Pt will demonstrate ability to swing and hit golf balls while maintaining neutral neck position to minimize risk for dizziness and drop attacks.    Time  5    Period  Weeks    Status  Deferred            Plan - 05/01/19 6387    Clinical Impression Statement  Since last PT visit pt has consulted with cardiology and is going to undergo further testing of symptoms.  Physician does not feel pt's symptoms are true vertigo, PT concurs that pt's current symptoms are outside the PT scope of practice.  Pt requested last PT note with BP readings before and after exercises.  Performed quick disclosure and provided pt with sealed copy of last treatment note.  Pt agrees that PT is no longer indicated and agreeable to D/C today.  LTG not assessed.    Personal Factors and Comorbidities  Comorbidity 3+;Transportation;Age    Examination-Activity Limitations  Bed Mobility;Bend;Locomotion Level;Reach Overhead;Transfers    Examination-Participation Restrictions  Driving;Yard Work;Other   golf   Stability/Clinical Decision Making  Evolving/Moderate complexity    Rehab Potential  Good    PT Frequency  1x / week    PT Duration  --   5 weeks   PT Treatment/Interventions  ADLs/Self Care Home Management;Canalith Repostioning;DME Instruction;Balance training;Therapeutic exercise;Therapeutic activities;Functional mobility training;Stair training;Gait training;Neuromuscular re-education;Patient/family education;Vestibular    Consulted and Agree with Plan of Care  Patient       Patient will benefit from skilled therapeutic intervention in order to improve the following deficits and impairments:  Abnormal  gait, Impaired flexibility, Dizziness, Decreased balance, Pain(PT will monitor LBP but will not directly treat)  Visit Diagnosis: Dizziness and giddiness  Other abnormalities of gait and mobility     Problem List Patient Active Problem List   Diagnosis Date Noted  . Syncope and collapse 12/21/2017  . Paroxysmal A-fib ([place]) 06/25/2017  . HFrEF (heart failure with reduced ejection fraction) ([place]) 06/05/2017  . Chronic low back pain 05/03/2017  .  History of carotid artery dissection 04/12/2016  . Carotid artery disease ([place])   . Supraventricular tachycardia ([place])   . Dyslipidemia   . Hypertension   . CVA (cerebral infarction)   . BPH (benign prostatic hyperplasia)   . Hematuria   . Postural dizziness with near syncope   . Ejection fraction   . Mitral regurgitation   . Chest discomfort      Rico Junker, PT, DPT 05/01/19    8:37 AM    [facility] [address] [place], [place], [address] Phone: [phone]   Fax:  [phone]  Name: TRICIA OAXACA MRN: [id number] Date of Birth: Oct 11, 1945

## 2019-05-02 ENCOUNTER — Telehealth: Payer: Self-pay | Admitting: Internal Medicine

## 2019-05-02 NOTE — Telephone Encounter (Signed)
Spoke with pt who states during his OV with Dr Caryl Comes 04/23/2019 Dr Caryl Comes mentioned him having a stress echo.  Pt states scheduling dept called him this am to schedule a "regular echo." .  Pt is questioning this, advised this office is not currently doing stress echos d/t Covid.  Informed pt Dr Caryl Comes needs to obtain pt's current EF.  Advised pt, per Dr Olin Pia office note pt may benefit from a treadmill test at some point.   Pt verbalizes understanding and is scheduled for echo 05/05/2019 at 720am. Pt is agreeable to keep this appointment.

## 2019-05-02 NOTE — Telephone Encounter (Signed)
Patient called because he feels he should have a stress echo instead of an echo. He would like to know if the doctor would consider changing it.

## 2019-05-02 NOTE — Telephone Encounter (Signed)
M  youre correct  Right now we just need a limited echo for LVEF  Thanks SK

## 2019-05-05 ENCOUNTER — Other Ambulatory Visit: Payer: Self-pay

## 2019-05-05 ENCOUNTER — Ambulatory Visit (HOSPITAL_COMMUNITY): Payer: Medicare Other | Attending: Cardiology

## 2019-05-05 DIAGNOSIS — I428 Other cardiomyopathies: Secondary | ICD-10-CM | POA: Insufficient documentation

## 2019-05-06 ENCOUNTER — Encounter: Payer: Self-pay | Admitting: Neurology

## 2019-05-06 ENCOUNTER — Ambulatory Visit (INDEPENDENT_AMBULATORY_CARE_PROVIDER_SITE_OTHER): Payer: Medicare Other | Admitting: Neurology

## 2019-05-06 VITALS — BP 154/88 | HR 71 | Ht 68.0 in | Wt 157.0 lb

## 2019-05-06 DIAGNOSIS — I480 Paroxysmal atrial fibrillation: Secondary | ICD-10-CM

## 2019-05-06 DIAGNOSIS — R55 Syncope and collapse: Secondary | ICD-10-CM | POA: Diagnosis not present

## 2019-05-06 DIAGNOSIS — I6522 Occlusion and stenosis of left carotid artery: Secondary | ICD-10-CM | POA: Diagnosis not present

## 2019-05-06 NOTE — Progress Notes (Signed)
Reason for visit: Syncope, vertigo  NOU DEBOER is an 73 y.o. male  History of present illness:  Mr. Evetts is a 73 year old right-handed white male with a history of episodes of syncope and near syncope since the fall 2017.  The patient may have events where he loses consciousness extremely briefly, the last such episode occurred while outside swinging golf clubs, he regained consciousness before he hit the ground.  The patient may have other events where he feels like he might blackout but does not, he oftentimes feels wiped out for a day or so afterwards.  He denies palpitations of the heart around the time of these events.  He has been empirically treated with anticonvulsant medications without benefit or tolerance.  He has been noted on CT angiogram to have 2 areas of stenosis of the left vertebral artery but the right vertebral artery is fully patent and the basilar artery is unremarkable.  The patient has been seen by Dr. Caryl Comes, he is being set up for loop recorder placement, a recent 2D echocardiogram showed good improvement in his ejection fraction which is now 55 to 60%.  The patient has not had any further blackout events since last seen.  He is in vestibular rehab as he is having episodes of vertigo if the looks up, he can also induce this by trying to walk with his eyes shut.  He may also get transient dizziness when he stands up quickly, but orthostatic hypotension has not been documented.  He is checking his blood pressure regularly before and after exercise.  Past Medical History:  Diagnosis Date  . BPH (benign prostatic hyperplasia)   . Carotid artery disease ([place])    Left internal carotid artery spontaneous dissection in the past  //   Doppler, October, 2013, minimal plaque, 0-39% bilateral  . Chronic low back pain 05/03/2017  . CVA (cerebral infarction)    left insular cortex stroke with left internal carotid artery dissection and near occlusion  . Dyslipidemia   .  Hematuria   . Hypertension   . Mitral regurgitation    mild, echo, March, 2010  . Persistent atrial fibrillation ([place])   . Syncope   . Syncope and collapse 12/21/2017    Past Surgical History:  Procedure Laterality Date  . BLADDER STONE REMOVAL    . LEFT HEART CATH AND CORONARY ANGIOGRAPHY N/A 06/27/2017   Procedure: LEFT HEART CATH AND CORONARY ANGIOGRAPHY;  Surgeon: Belva Crome, MD;  Location: [facility];  Service: Cardiovascular;  Laterality: N/A;  . PROSTATE SURGERY  2009  . WRIST SURGERY     Right    Family History  Problem Relation Age of Onset  . Arrhythmia Mother   . Hypertension Mother   . Arthritis Mother   . Other Father        tumor  . Prostate cancer Father   . CVA Neg Hx     Social history:  reports that he has never smoked. He has never used smokeless tobacco. He reports previous alcohol use. He reports that he does not use drugs.   No Known Allergies  Medications:  Prior to Admission medications   Medication Sig Start Date End Date Taking? Authorizing Provider  atorvastatin (LIPITOR) 20 MG tablet Take 1 tablet (20 mg total) by mouth daily. 06/20/16  Yes Nahser, Wonda Cheng, MD  Flecainide Acetate (TAMBOCOR PO) Take 75 mg by mouth 2 (two) times a day.   Yes [provider]  metoprolol  succinate (TOPROL-XL) 25 MG 24 hr tablet Take 25 mg by mouth daily.   Yes [provider]  XARELTO 20 MG TABS tablet TAKE 1 TABLET BY MOUTH EVERY DAY WITH SUPPER 01/15/18  Yes Sherran Needs, NP    ROS:  Out of a complete 14 system review of symptoms, the patient complains only of the following symptoms, and all other reviewed systems are negative.  Dizziness, blackout episodes  Blood pressure (!) 154/88, pulse 71, height 5\' 8"  (1.727 m), weight 157 lb (71.2 kg).  Physical Exam  General: The patient is alert and cooperative at the time of the examination.  Skin: No significant peripheral edema is noted.   Neurologic Exam  Mental status:  The patient is alert and oriented x 3 at the time of the examination. The patient has apparent normal recent and remote memory, with an apparently normal attention span and concentration ability.   Cranial nerves: Facial symmetry is present. Speech is normal, no aphasia or dysarthria is noted. Extraocular movements are full. Visual fields are full.  Motor: The patient has good strength in all 4 extremities.  Sensory examination: Soft touch sensation is symmetric on the face, arms, and legs.  Coordination: The patient has good finger-nose-finger and heel-to-shin bilaterally.  Gait and station: The patient has a normal gait. Tandem gait is slightly unsteady. Romberg is negative. No drift is seen.  Reflexes: Deep tendon reflexes are symmetric.   Assessment/Plan:  1.  Near syncope and syncope  2.  Episodic vertigo  3.  Left vertebral artery stenosis by CT angiogram  We have discussed getting a cerebral angiogram to fully delineate the cerebrovascular circulation.  The CT angiogram does not clearly show a definite etiology for syncope.  I have discussed this with Dr. Leonie Man as well, he has recommended potentially getting a cerebral angiogram to fully delineate the cerebrovascular circulation.  The patient will have a loop recorder implanted.  The patient is amenable to the cerebral angiogram, he will contact our office in early January when he decides he wishes to have the study done, we will check blood work at that time to document kidney function and get the study set up.  He will otherwise follow-up in 6 months.  He is not currently operating a motor vehicle.  Jill Alexanders MD 05/06/2019 8:09 AM  [facility] [address] [place], [place] [address]  Phone [phone] Fax [phone]

## 2019-05-08 ENCOUNTER — Encounter: Payer: Medicare Other | Admitting: Physical Therapy

## 2019-05-09 ENCOUNTER — Other Ambulatory Visit: Payer: Self-pay

## 2019-05-09 ENCOUNTER — Ambulatory Visit (INDEPENDENT_AMBULATORY_CARE_PROVIDER_SITE_OTHER): Payer: Medicare Other | Admitting: Internal Medicine

## 2019-05-09 VITALS — BP 122/82 | HR 62 | Wt 155.8 lb

## 2019-05-09 DIAGNOSIS — R55 Syncope and collapse: Secondary | ICD-10-CM

## 2019-05-09 NOTE — Patient Instructions (Signed)
Medication Instructions:  Continue current medications *If you need a refill on your cardiac medications before your next appointment, please call your pharmacy*  Lab Work: None ordered.  If you have labs (blood work) drawn today and your tests are completely normal, you will receive your results only by: Marland Kitchen MyChart Message (if you have MyChart) OR . A paper copy in the mail If you have any lab test that is abnormal or we need to change your treatment, we will call you to review the results.  Testing/Procedures: None ordered.   Follow-Up: At [facility], you and your health needs are our priority.  As part of our continuing mission to provide you with exceptional heart care, we have created designated Provider Care Teams.  These Care Teams include your primary Cardiologist (physician) and Advanced Practice Providers (APPs -  Physician Assistants and Nurse Practitioners) who all work together to provide you with the care you need, when you need it.  Your next appointment:   Follow up with Dr Caryl Comes in 6 months   You may shower after 24 hours Remove over bandage on day 3 Allow the steri strips to fall off naturally You may submerge in water after one week   Tissue Adhesive Wound Care Some cuts, wounds, lacerations, and incisions can be repaired by using tissue adhesive, also called skin glue. It holds the skin together so healing can happen faster. It forms a strong bond on the skin in about 1 minute, and it reaches its full strength in about 2-3 minutes. The adhesive disappears naturally while the wound is healing. It is important to take proper care of your wound at home while it heals. Follow these instructions at home: Wound care   Showers are allowed after the first 24 hours. Do not soak the area where the tissue adhesive was placed. Do not take baths, swim, or use hot tubs. Do not use any soaps, petroleum jelly products, or ointments on the wound. Certain ointments can weaken  the glue.  If a bandage (dressing) has been applied, keep it dry.  Follow instructions from your health care provider about how often to change the dressing. ? Wash your hands with soap and water before you change your dressing. If soap and water are not available, use hand sanitizer. ? Change your dressing as told by your health care provider. ? Leave tissue adhesive in place. It will fall off on its own after 7-10 days.  Do not scratch, rub, or pick at the adhesive.  Do not place tape over the adhesive. The adhesive could come off of the wound when you pull the tape off.  Protect the wound from further injury until it is healed.  Protect the wound from sun and tanning bed exposure while it is healing and for several weeks after healing. General instructions  Take over-the-counter and prescription medicines only as told by your health care provider.  Keep all follow-up visits as told by your health care provider. This is important. Get help right away if:  Your wound reopens and is draining.  Your wound becomes red, swollen, hot, or tender.  You develop a rash after the glue is applied.  You have increasing pain in the wound.  You have a red streak going away from the wound.  You have pus coming from the wound.  You have increased bleeding.  You have a fever.  You have shaking chills.  You notice a bad smell coming from the wound.  Your  wound or the adhesive breaks open. This information is not intended to replace advice given to you by your health care provider. Make sure you discuss any questions you have with your health care provider. Document Released: 11/08/2000 Document Revised: 04/27/2017 Document Reviewed: 04/07/2016 Elsevier Patient Education  2020 [employer].

## 2019-05-09 NOTE — Progress Notes (Signed)
[name] [id number]  [id number]     Pre op Dx syncope Post op Dx Same  Procedure  Loop Recorder implantation  After routine prep and drape of the left parasternal area, a small incision was created. A [employer]onic LINQ Reveal Loop Recorder  Serial Number  [id number] was inserted.    SteriStrip dressing was  applied.  The patient tolerated the procedure without apparent complication.  EBL < 10cc      Virl Axe, MD 05/09/2019 1:15 PM

## 2019-05-12 ENCOUNTER — Telehealth: Payer: Self-pay | Admitting: Neurology

## 2019-05-12 DIAGNOSIS — Z5181 Encounter for therapeutic drug level monitoring: Secondary | ICD-10-CM

## 2019-05-12 DIAGNOSIS — R55 Syncope and collapse: Secondary | ICD-10-CM

## 2019-05-12 NOTE — Telephone Encounter (Signed)
I contacted the pt. He reports he is ready to proceed with the cerebral angiogram. Pt is asking for order to be put it. I will send message to Dr. Jannifer Franklin to review once he returns to the office. Pt understands Dr. Jannifer Franklin is out the office until 05/19/2019 and we will try and get it worked in by the end of the year if we can for this study.

## 2019-05-12 NOTE — Telephone Encounter (Signed)
Pt is asking for a call to discuss the CT being done in December, he no longer wants to wait until January.  Please call to discuss

## 2019-05-14 NOTE — Telephone Encounter (Signed)
I called patient.  He is decided to go ahead and get the cerebral angiogram, I will put in blood work to check kidney function, and put order in for the cerebral angiogram.

## 2019-05-14 NOTE — Addendum Note (Signed)
Addended by: Kathrynn Ducking on: 05/14/2019 12:29 PM   Modules accepted: Orders

## 2019-05-15 ENCOUNTER — Other Ambulatory Visit (HOSPITAL_COMMUNITY): Payer: Self-pay | Admitting: Interventional Radiology

## 2019-05-15 ENCOUNTER — Other Ambulatory Visit (INDEPENDENT_AMBULATORY_CARE_PROVIDER_SITE_OTHER): Payer: Self-pay

## 2019-05-15 ENCOUNTER — Other Ambulatory Visit: Payer: Self-pay

## 2019-05-15 DIAGNOSIS — Z0289 Encounter for other administrative examinations: Secondary | ICD-10-CM

## 2019-05-15 DIAGNOSIS — Z5181 Encounter for therapeutic drug level monitoring: Secondary | ICD-10-CM

## 2019-05-15 DIAGNOSIS — R55 Syncope and collapse: Secondary | ICD-10-CM

## 2019-05-16 ENCOUNTER — Other Ambulatory Visit: Payer: Self-pay

## 2019-05-16 ENCOUNTER — Other Ambulatory Visit (HOSPITAL_COMMUNITY): Payer: Self-pay | Admitting: Interventional Radiology

## 2019-05-16 ENCOUNTER — Ambulatory Visit (HOSPITAL_COMMUNITY)
Admission: RE | Admit: 2019-05-16 | Discharge: 2019-05-16 | Disposition: A | Discharge status: Home / Self Care | Payer: Medicare Other | Source: Ambulatory Visit | Attending: Interventional Radiology | Admitting: Interventional Radiology

## 2019-05-16 DIAGNOSIS — I10 Essential (primary) hypertension: Secondary | ICD-10-CM | POA: Insufficient documentation

## 2019-05-16 DIAGNOSIS — Z7901 Long term (current) use of anticoagulants: Secondary | ICD-10-CM | POA: Diagnosis not present

## 2019-05-16 DIAGNOSIS — E785 Hyperlipidemia, unspecified: Secondary | ICD-10-CM | POA: Insufficient documentation

## 2019-05-16 DIAGNOSIS — I4819 Other persistent atrial fibrillation: Secondary | ICD-10-CM | POA: Diagnosis not present

## 2019-05-16 DIAGNOSIS — N400 Enlarged prostate without lower urinary tract symptoms: Secondary | ICD-10-CM | POA: Insufficient documentation

## 2019-05-16 DIAGNOSIS — Z79899 Other long term (current) drug therapy: Secondary | ICD-10-CM | POA: Insufficient documentation

## 2019-05-16 DIAGNOSIS — Z8249 Family history of ischemic heart disease and other diseases of the circulatory system: Secondary | ICD-10-CM | POA: Diagnosis not present

## 2019-05-16 DIAGNOSIS — Z8673 Personal history of transient ischemic attack (TIA), and cerebral infarction without residual deficits: Secondary | ICD-10-CM | POA: Diagnosis not present

## 2019-05-16 DIAGNOSIS — H538 Other visual disturbances: Secondary | ICD-10-CM | POA: Diagnosis not present

## 2019-05-16 DIAGNOSIS — I340 Nonrheumatic mitral (valve) insufficiency: Secondary | ICD-10-CM | POA: Diagnosis not present

## 2019-05-16 DIAGNOSIS — R55 Syncope and collapse: Secondary | ICD-10-CM | POA: Diagnosis present

## 2019-05-16 DIAGNOSIS — G450 Vertebro-basilar artery syndrome: Secondary | ICD-10-CM | POA: Insufficient documentation

## 2019-05-16 DIAGNOSIS — R42 Dizziness and giddiness: Secondary | ICD-10-CM | POA: Diagnosis not present

## 2019-05-16 DIAGNOSIS — I6502 Occlusion and stenosis of left vertebral artery: Secondary | ICD-10-CM | POA: Diagnosis not present

## 2019-05-16 HISTORY — PX: IR US GUIDE VASC ACCESS RIGHT: IMG2390

## 2019-05-16 HISTORY — PX: IR ANGIO INTRA EXTRACRAN SEL COM CAROTID INNOMINATE BILAT MOD SED: IMG5360

## 2019-05-16 HISTORY — PX: IR ANGIO VERTEBRAL SEL VERTEBRAL BILAT MOD SED: IMG5369

## 2019-05-16 LAB — APTT: aPTT: 33 seconds (ref 24–36)

## 2019-05-16 LAB — COMPREHENSIVE METABOLIC PANEL
ALT: 75 IU/L — ABNORMAL HIGH (ref 0–44)
AST: 40 IU/L (ref 0–40)
Albumin/Globulin Ratio: 2.6 — ABNORMAL HIGH (ref 1.2–2.2)
Albumin: 4.4 g/dL (ref 3.7–4.7)
Alkaline Phosphatase: 84 IU/L (ref 39–117)
BUN/Creatinine Ratio: 20 (ref 10–24)
BUN: 19 mg/dL (ref 8–27)
Bilirubin Total: 0.8 mg/dL (ref 0.0–1.2)
CO2: 24 mmol/L (ref 20–29)
Calcium: 9.2 mg/dL (ref 8.6–10.2)
Chloride: 103 mmol/L (ref 96–106)
Creatinine, Ser: 0.95 mg/dL (ref 0.76–1.27)
GFR calc Af Amer: 90 mL/min/{1.73_m2} (ref >59)
GFR calc non Af Amer: 78 mL/min/{1.73_m2} (ref >59)
Globulin, Total: 1.7 g/dL (ref 1.5–4.5)
Glucose: 103 mg/dL — ABNORMAL HIGH (ref 65–99)
Potassium: 4.1 mmol/L (ref 3.5–5.2)
Sodium: 140 mmol/L (ref 134–144)
Total Protein: 6.1 g/dL (ref 6.0–8.5)

## 2019-05-16 LAB — CBC
HCT: 48.2 % (ref 39.0–52.0)
Hemoglobin: 16.6 g/dL (ref 13.0–17.0)
MCH: 32.6 pg (ref 26.0–34.0)
MCHC: 34.4 g/dL (ref 30.0–36.0)
MCV: 94.7 fL (ref 80.0–100.0)
Platelets: 165 10*3/uL (ref 150–400)
RBC: 5.09 MIL/uL (ref 4.22–5.81)
RDW: 12.2 % (ref 11.5–15.5)
WBC: 4.5 10*3/uL (ref 4.0–10.5)
nRBC: 0 % (ref 0.0–0.2)

## 2019-05-16 LAB — BASIC METABOLIC PANEL
Anion gap: 10 (ref 5–15)
BUN: 19 mg/dL (ref 8–23)
CO2: 26 mmol/L (ref 22–32)
Calcium: 8.9 mg/dL (ref 8.9–10.3)
Chloride: 104 mmol/L (ref 98–111)
Creatinine, Ser: 1.07 mg/dL (ref 0.61–1.24)
GFR calc Af Amer: >60 mL/min (ref >60)
GFR calc non Af Amer: >60 mL/min (ref >60)
Glucose, Bld: 95 mg/dL (ref 70–99)
Potassium: 3.9 mmol/L (ref 3.5–5.1)
Sodium: 140 mmol/L (ref 135–145)

## 2019-05-16 LAB — PROTIME-INR
INR: 1.1 (ref 0.8–1.2)
Prothrombin Time: 13.7 seconds (ref 11.4–15.2)

## 2019-05-16 MED ORDER — VERAPAMIL HCL 2.5 MG/ML IV SOLN
INTRAVENOUS | Status: AC
  Filled 2019-05-16: qty 2

## 2019-05-16 MED ORDER — FENTANYL CITRATE (PF) 100 MCG/2ML IJ SOLN
INTRAMUSCULAR | Status: AC
  Filled 2019-05-16: qty 2

## 2019-05-16 MED ORDER — IOHEXOL 300 MG/ML  SOLN
150.0000 mL | Freq: Once | INTRAMUSCULAR | Status: AC | PRN
  Administered 2019-05-16: 12:00:00 75 mL via INTRA_ARTERIAL

## 2019-05-16 MED ORDER — SODIUM CHLORIDE 0.9 % IV SOLN: INTRAVENOUS | Status: AC

## 2019-05-16 MED ORDER — VERAPAMIL HCL 2.5 MG/ML IV SOLN: INTRA_ARTERIAL | Status: AC | PRN

## 2019-05-16 MED ORDER — HEPARIN SODIUM (PORCINE) 1000 UNIT/ML IJ SOLN
INTRAMUSCULAR | Status: AC
  Filled 2019-05-16: qty 1

## 2019-05-16 MED ORDER — MIDAZOLAM HCL 2 MG/2ML IJ SOLN
INTRAMUSCULAR | Status: AC | PRN
  Administered 2019-05-16: 1 mg via INTRAVENOUS

## 2019-05-16 MED ORDER — MIDAZOLAM HCL 2 MG/2ML IJ SOLN
INTRAMUSCULAR | Status: AC
  Filled 2019-05-16: qty 2

## 2019-05-16 MED ORDER — NITROGLYCERIN 1 MG/10 ML FOR IR/CATH LAB
INTRA_ARTERIAL | Status: AC
  Filled 2019-05-16: qty 10

## 2019-05-16 MED ORDER — SODIUM CHLORIDE 0.9 % IV SOLN: INTRAVENOUS | Status: DC

## 2019-05-16 MED ORDER — FENTANYL CITRATE (PF) 100 MCG/2ML IJ SOLN
INTRAMUSCULAR | Status: AC | PRN
  Administered 2019-05-16: 25 ug via INTRAVENOUS

## 2019-05-16 MED ORDER — LIDOCAINE HCL 1 % IJ SOLN
INTRAMUSCULAR | Status: AC
  Filled 2019-05-16: qty 20

## 2019-05-16 NOTE — Procedures (Signed)
S/P 4 vessel cerebral arterriohram RT rad approach. Findings. 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

## 2019-05-16 NOTE — Discharge Instructions (Addendum)
Radial Site Care  This sheet gives you information about how to care for yourself after your procedure. Your health care provider may also give you more specific instructions. If you have problems or questions, contact your health care provider. What can I expect after the procedure? After the procedure, it is common to have:  Bruising and tenderness at the catheter insertion area. Follow these instructions at home: Medicines  Take over-the-counter and prescription medicines only as told by your health care provider. Insertion site care  Follow instructions from your health care provider about how to take care of your insertion site. Make sure you: ? Wash your hands with soap and water before you change your bandage (dressing). If soap and water are not available, use hand sanitizer. ? Change your dressing as told by your health care provider. ? Leave stitches (sutures), skin glue, or adhesive strips in place. These skin closures may need to stay in place for 2 weeks or longer. If adhesive strip edges start to loosen and curl up, you may trim the loose edges. Do not remove adhesive strips completely unless your health care provider tells you to do that.  Check your insertion site every day for signs of infection. Check for: ? Redness, swelling, or pain. ? Fluid or blood. ? Pus or a bad smell. ? Warmth.  Do not take baths, swim, or use a hot tub until your health care provider approves.  You may shower 24-48 hours after the procedure, or as directed by your health care provider. ? Remove the dressing and gently wash the site with plain soap and water. ? Pat the area dry with a clean towel. ? Do not rub the site. That could cause bleeding.  Do not apply powder or lotion to the site. Activity   For 24 hours after the procedure, or as directed by your health care provider: ? Do not flex or bend the affected arm. ? Do not push or pull heavy objects with the affected arm. ? Do not  drive yourself home from the hospital or clinic. You may drive 24 hours after the procedure unless your health care provider tells you not to. ? Do not operate machinery or power tools.  Do not lift anything that is heavier than 10 lb (4.5 kg), or the limit that you are told, until your health care provider says that it is safe.  Ask your health care provider when it is okay to: ? Return to work or school. ? Resume usual physical activities or sports. ? Resume sexual activity. General instructions  If the catheter site starts to bleed, raise your arm and put firm pressure on the site. If the bleeding does not stop, get help right away. This is a medical emergency.  If you went home on the same day as your procedure, a responsible adult should be with you for the first 24 hours after you arrive home.  Keep all follow-up visits as told by your health care provider. This is important. Contact a health care provider if:  You have a fever.  You have redness, swelling, or yellow drainage around your insertion site. Get help right away if:  You have unusual pain at the radial site.  The catheter insertion area swells very fast.  The insertion area is bleeding, and the bleeding does not stop when you hold steady pressure on the area.  Your arm or hand becomes pale, cool, tingly, or numb. These symptoms may represent a serious problem   that is an emergency. Do not wait to see if the symptoms will go away. Get medical help right away. Call your local emergency services (911 in the [place] Do not drive yourself to the hospital. Summary  After the procedure, it is common to have bruising and tenderness at the site.  Follow instructions from your health care provider about how to take care of your radial site wound. Check the wound every day for signs of infection.  Do not lift anything that is heavier than 10 lb (4.5 kg), or the limit that you are told, until your health care provider says  that it is safe. This information is not intended to replace advice given to you by your health care provider. Make sure you discuss any questions you have with your health care provider. Document Released: 06/17/2010 Document Revised: 06/20/2017 Document Reviewed: 06/20/2017 Elsevier Patient Education  2020 [employer]  Moderate Conscious Sedation, Adult, Care After These instructions provide you with information about caring for yourself after your procedure. Your health care provider may also give you more specific instructions. Your treatment has been planned according to current medical practices, but problems sometimes occur. Call your health care provider if you have any problems or questions after your procedure. What can I expect after the procedure? After your procedure, it is common:  To feel sleepy for several hours.  To feel clumsy and have poor balance for several hours.  To have poor judgment for several hours.  To vomit if you eat too soon. Follow these instructions at home: For at least 24 hours after the procedure:   Do not: ? Participate in activities where you could fall or become injured. ? Drive. ? Use heavy machinery. ? Drink alcohol. ? Take sleeping pills or medicines that cause drowsiness. ? Make important decisions or sign legal documents. ? Take care of children on your own.  Rest. Eating and drinking  Follow the diet recommended by your health care provider.  If you vomit: ? Drink water, juice, or soup when you can drink without vomiting. ? Make sure you have little or no nausea before eating solid foods. General instructions  Have a responsible adult stay with you until you are awake and alert.  Take over-the-counter and prescription medicines only as told by your health care provider.  If you smoke, do not smoke without supervision.  Keep all follow-up visits as told by your health care provider. This is important. Contact a health care  provider if:  You keep feeling nauseous or you keep vomiting.  You feel light-headed.  You develop a rash.  You have a fever. Get help right away if:  You have trouble breathing. This information is not intended to replace advice given to you by your health care provider. Make sure you discuss any questions you have with your health care provider. Document Released: 03/05/2013 Document Revised: 04/27/2017 Document Reviewed: 09/04/2015 Elsevier Patient Education  2020 [employer]  

## 2019-05-16 NOTE — H&P (Signed)
Chief Complaint: Patient was seen in consultation today for left VA stenosis/diagnostic cerebral arteriogram.  Referring Physician(s): Kathrynn Ducking  Supervising Physician: Luanne Bras  Patient Status: [facility] - Out-pt  History of Present Illness: Billy Carpenter is a 73 y.o. male with a past medical history of hypertension, dyslipidemia, atrial fibrillation on chronic anticoagulation with Xarelto, mitral regurgitation, CVA, syncope, BPH, and chronic low back pain. He is known to [facility]- he has undergone two diagnostic cerebral arteriograms by Dr. Estanislado Pandy in 2001 and 2002. He has had episodes of syncope and collapse since 2017. He sees Dr. Jannifer Franklin for management. His recent CTA head/neck 01/07/2019 revealed left VA stenosis.  CTA head/neck 01/07/2019: 1. Chronic infarct left insula and left frontal operculum unchanged from prior studies. No acute intracranial abnormality 2. Mild atherosclerotic disease in the carotid bulb bilaterally without significant carotid stenosis. Mild fusiform dilatation of the distal internal carotid artery bilaterally as noted on prior studies. No evidence of dissection or FMD at this time. 3. Right vertebral artery widely patent. Two segments of severe extrinsic compression of the left vertebral artery due to marked bony overgrowth and facet joints at C3-4 and C5-6.  NIR requested by Dr. Jannifer Franklin for possible image-guided diagnostic cerebral arteriogram to accurately evaluate left VA stenosis. Patient awake and alert laying in bed. Complains of "blackout episodes" of dizziness. States the last episode occurred a few weeks ago while sweeping the floor. States he has associated blurred vision during these episodes. Denies fever, chills, chest pain, dyspnea, abdominal pain, or headache.  Currently taking Xarelto 20 mg once daily- last dose 05/14/2019.   Past Medical History:  Diagnosis Date  . BPH (benign prostatic hyperplasia)   . Carotid artery disease  ([place])    Left internal carotid artery spontaneous dissection in the past  //   Doppler, October, 2013, minimal plaque, 0-39% bilateral  . Chronic low back pain 05/03/2017  . CVA (cerebral infarction)    left insular cortex stroke with left internal carotid artery dissection and near occlusion  . Dyslipidemia   . Hematuria   . Hypertension   . Mitral regurgitation    mild, echo, March, 2010  . Persistent atrial fibrillation ([place])   . Syncope   . Syncope and collapse 12/21/2017    Past Surgical History:  Procedure Laterality Date  . BLADDER STONE REMOVAL    . LEFT HEART CATH AND CORONARY ANGIOGRAPHY N/A 06/27/2017   Procedure: LEFT HEART CATH AND CORONARY ANGIOGRAPHY;  Surgeon: Belva Crome, MD;  Location: [facility];  Service: Cardiovascular;  Laterality: N/A;  . PROSTATE SURGERY  2009  . WRIST SURGERY     Right    Allergies: Patient has no known allergies.  Medications: Prior to Admission medications   Medication Sig Start Date End Date Taking? Authorizing Provider  atorvastatin (LIPITOR) 20 MG tablet Take 1 tablet (20 mg total) by mouth daily. 06/20/16  Yes Nahser, Wonda Cheng, MD  Flecainide Acetate (TAMBOCOR PO) Take 75 mg by mouth 2 (two) times a day.   Yes [provider]  metoprolol succinate (TOPROL-XL) 25 MG 24 hr tablet Take 25 mg by mouth daily.   Yes [provider]  XARELTO 20 MG TABS tablet TAKE 1 TABLET BY MOUTH EVERY DAY WITH SUPPER 01/15/18   Sherran Needs, NP     Family History  Problem Relation Age of Onset  . Arrhythmia Mother   . Hypertension Mother   . Arthritis Mother   . Other Father  tumor  . Prostate cancer Father   . CVA Neg Hx     Social History   Socioeconomic History  . Marital status: Married    Spouse name: Happy  . Number of children: 3  . Years of education: MBA  . Highest education level: Not on file  Occupational History  . Not on file  Tobacco Use  . Smoking status: Never Smoker  . Smokeless  tobacco: Never Used  Substance and Sexual Activity  . Alcohol use: Not Currently    Comment: due to a-fib  . Drug use: No  . Sexual activity: Not on file  Other Topics Concern  . Not on file  Social History Narrative   Lives with wife   Right handed   Caffeine use: 3 cups per day   Social Determinants of Health   Financial Resource Strain:   . Difficulty of Paying Living Expenses: Not on file  Food Insecurity:   . Worried About Charity fundraiser in the Last Year: Not on file  . Ran Out of Food in the Last Year: Not on file  Transportation Needs:   . Lack of Transportation (Medical): Not on file  . Lack of Transportation (Non-Medical): Not on file  Physical Activity:   . Days of Exercise per Week: Not on file  . Minutes of Exercise per Session: Not on file  Stress:   . Feeling of Stress : Not on file  Social Connections:   . Frequency of Communication with Friends and Family: Not on file  . Frequency of Social Gatherings with Friends and Family: Not on file  . Attends Religious Services: Not on file  . Active Member of Clubs or Organizations: Not on file  . Attends Archivist Meetings: Not on file  . Marital Status: Not on file     Review of Systems: A 12 point ROS discussed and pertinent positives are indicated in the HPI above.  All other systems are negative.  Review of Systems  Constitutional: Negative for chills and fever.  Eyes: Positive for visual disturbance.  Respiratory: Negative for shortness of breath and wheezing.   Cardiovascular: Negative for chest pain and palpitations.  Gastrointestinal: Negative for abdominal pain.  Neurological: Positive for dizziness. Negative for headaches.  Psychiatric/Behavioral: Negative for behavioral problems and confusion.    Vital Signs: BP 130/83   Pulse 65   Temp 97.7 F (36.5 C) (Skin)   Resp 16   Ht 5\' 8"  (1.727 m)   Wt 152 lb (68.9 kg)   SpO2 97%   BMI 23.11 kg/m   Physical Exam Vitals and  nursing note reviewed.  Constitutional:      General: He is not in acute distress.    Appearance: Normal appearance.  Cardiovascular:     Rate and Rhythm: Normal rate and regular rhythm.     Heart sounds: Normal heart sounds. No murmur.  Pulmonary:     Effort: Pulmonary effort is normal. No respiratory distress.     Breath sounds: Normal breath sounds. No wheezing.  Skin:    General: Skin is warm and dry.  Neurological:     Mental Status: He is alert and oriented to person, place, and time.  Psychiatric:        Mood and Affect: Mood normal.        Behavior: Behavior normal.      MD Evaluation Airway: WNL Heart: WNL Abdomen: WNL Chest/ Lungs: WNL ASA  Classification: 3 Mallampati/Airway Score: Two  Imaging: ECHOCARDIOGRAM COMPLETE  Result Date: 05/05/2019   ECHOCARDIOGRAM REPORT   Patient Name:   Billy Carpenter Date of Exam: 05/05/2019 Medical Rec #:  [id number]      Height:       68.0 in Accession #:    [id number]     Weight:       156.4 lb Date of Birth:  June 24, 1945     BSA:          1.84 m Patient Age:    1 years       BP:           126/82 mmHg Patient Gender: M              HR:           66 bpm. Exam Location:  [facility] Procedure: 2D Echo, Cardiac Doppler and Color Doppler Indications:    I42.8 Non-ischemic cardiomyopathy  History:        Patient has prior history of Echocardiogram examinations, most                 recent 06/26/2017. CHF, Stroke and Carotid Disease,                 Arrythmias:SVT and Atrial Fibrillation, Signs/Symptoms:Syncope;                 Risk Factors:Dyslipidemia and Hypertension.  Sonographer:    Jessee Avers, RDCS Referring Phys: Nellie  1. Left ventricular ejection fraction, by visual estimation, is 55 to 60%. The left ventricle has normal function. There is no left ventricular hypertrophy.  2. Left ventricular diastolic parameters are consistent with Grade I diastolic dysfunction (impaired relaxation).  3. The left  ventricle has no regional wall motion abnormalities.  4. Global right ventricle has normal systolic function.The right ventricular size is mildly enlarged. No increase in right ventricular wall thickness.  5. Left atrial size was mildly dilated.  6. Right atrial size was mildly dilated.  7. Mild mitral annular calcification.  8. The mitral valve is normal in structure. Trace mitral valve regurgitation. No evidence of mitral stenosis.  9. The tricuspid valve is normal in structure. Tricuspid valve regurgitation is trivial. 10. The aortic valve is tricuspid. Aortic valve regurgitation is trivial. No evidence of aortic valve sclerosis or stenosis. 11. There is mild to moderate dilatation of the aortic root measuring 44 mm. 12. The tricuspid regurgitant velocity is 2.33 m/s, and with an assumed right atrial pressure of 3 mmHg, the estimated right ventricular systolic pressure is normal at 24.7 mmHg. 13. The inferior vena cava is normal in size with greater than 50% respiratory variability, suggesting right atrial pressure of 3 mmHg. In comparison to the previous echocardiogram(s): 06/26/17 EF 45-50%. FINDINGS  Left Ventricle: Left ventricular ejection fraction, by visual estimation, is 55 to 60%. The left ventricle has normal function. The left ventricle has no regional wall motion abnormalities. The left ventricular internal cavity size was the left ventricle is normal in size. There is no left ventricular hypertrophy. Left ventricular diastolic parameters are consistent with Grade I diastolic dysfunction (impaired relaxation). Right Ventricle: The right ventricular size is mildly enlarged. No increase in right ventricular wall thickness. Global RV systolic function is has normal systolic function. The tricuspid regurgitant velocity is 2.33 m/s, and with an assumed right atrial  pressure of 3 mmHg, the estimated right ventricular systolic pressure is normal at 24.7 mmHg. Left Atrium: Left atrial size was mildly  dilated.  Right Atrium: Right atrial size was mildly dilated Pericardium: There is no evidence of pericardial effusion. Mitral Valve: The mitral valve is normal in structure. Mild mitral annular calcification. No evidence of mitral valve stenosis by observation. Trace mitral valve regurgitation. Tricuspid Valve: The tricuspid valve is normal in structure. Tricuspid valve regurgitation is trivial. Aortic Valve: The aortic valve is tricuspid. Aortic valve regurgitation is trivial. The aortic valve is structurally normal, with no evidence of sclerosis or stenosis. Pulmonic Valve: The pulmonic valve was normal in structure. Pulmonic valve regurgitation is trivial. Aorta: Aortic dilatation noted. There is mild to moderate dilatation of the aortic root measuring 44 mm. Venous: The inferior vena cava is normal in size with greater than 50% respiratory variability, suggesting right atrial pressure of 3 mmHg. IAS/Shunts: No atrial level shunt detected by color flow Doppler.  LEFT VENTRICLE PLAX 2D LVIDd:         5.00 cm  Diastology LVIDs:         3.60 cm  LV e' lateral:   6.39 cm/s LV PW:         0.90 cm  LV E/e' lateral: 5.3 LV IVS:        1.20 cm  LV e' medial:    5.08 cm/s LVOT diam:     2.70 cm  LV E/e' medial:  6.6 LV SV:         64 ml LV SV Index:   34.49 LVOT Area:     5.73 cm  RIGHT VENTRICLE RV Basal diam:  4.40 cm RV Mid diam:    3.30 cm RV S prime:     14.90 cm/s TAPSE (M-mode): 2.8 cm RVSP:           24.7 mmHg LEFT ATRIUM             Index       RIGHT ATRIUM           Index LA diam:        4.10 cm 2.23 cm/m  RA Pressure: 3.00 mmHg LA Vol (A2C):   80.7 ml 43.84 ml/m RA Area:     20.30 cm LA Vol (A4C):   44.3 ml 24.06 ml/m RA Volume:   66.80 ml  36.29 ml/m LA Biplane Vol: 61.9 ml 33.62 ml/m  AORTIC VALVE LVOT Vmax:   69.00 cm/s LVOT Vmean:  48.000 cm/s LVOT VTI:    0.158 m  AORTA Ao Root diam: 4.40 cm Ao Asc diam:  3.80 cm MITRAL VALVE                        TRICUSPID VALVE                                     TR Peak  grad:   21.7 mmHg                                     TR Vmax:        233.00 cm/s MV Decel Time: 560 msec             Estimated RAP:  3.00 mmHg MV E velocity: 33.60 cm/s 103 cm/s  RVSP:           24.7 mmHg MV A velocity: 54.20 cm/s 70.3 cm/s MV E/A  ratio:  0.62       1.5       SHUNTS                                     Systemic VTI:  0.16 m                                     Systemic Diam: 2.70 cm  Loralie Champagne MD Electronically signed by Loralie Champagne MD Signature Date/Time: 05/05/2019/1:52:10 PM    Final     Labs:  CBC: Recent Labs    05/16/19 0837  WBC 4.5  HGB 16.6  HCT 48.2  PLT 165    COAGS: Recent Labs    05/16/19 0837  INR 1.1  APTT 33    BMP: Recent Labs    12/24/18 1022 05/15/19 0811  NA 141 140  K 4.7 4.1  CL 103 103  CO2 23 24  GLUCOSE 83 103*  BUN 25 19  CALCIUM 9.2 9.2  CREATININE 1.03 0.95  GFRNONAA 71 78  GFRAA 82 90    LIVER FUNCTION TESTS: Recent Labs    12/24/18 1022 05/15/19 0811  BILITOT 0.7 0.8  AST 26 40  ALT 36 75*  ALKPHOS 84 84  PROT 6.3 6.1  ALBUMIN 4.6 4.4     Assessment and Plan:  Left VA stenosis. Plan for image-guided diagnostic cerebral arteriogram today with Dr. Estanislado Pandy. Patient is NPO. Afebrile and WBCs WNL. Last dose Xarelto 05/14/2019- ok to proceed per IR protocol. INR 1.1 today.  Risks and benefits of cerebral arteriogram were discussed with the patient including, but not limited to bleeding, infection, vascular injury, stroke, or contrast induced renal failure. This interventional procedure involves the use of X-rays and because of the nature of the planned procedure, it is possible that we will have prolonged use of X-ray fluoroscopy. Potential radiation risks to you include (but are not limited to) the following: - A slightly elevated risk for cancer  several years later in life. This risk is typically less than 0.5% percent. This risk is low in comparison to the normal incidence of human cancer, which is  33% for women and 50% for men according to the [place]. - Radiation induced injury can include skin redness, resembling a rash, tissue breakdown / ulcers and hair loss (which can be temporary or permanent).  The likelihood of either of these occurring depends on the difficulty of the procedure and whether you are sensitive to radiation due to previous procedures, disease, or genetic conditions.  IF your procedure requires a prolonged use of radiation, you will be notified and given written instructions for further action.  It is your responsibility to monitor the irradiated area for the 2 weeks following the procedure and to notify your physician if you are concerned that you have suffered a radiation induced injury. All of the patient's questions were answered, patient is agreeable to proceed. Consent signed and in chart.   Thank you for this interesting consult.  I greatly enjoyed meeting Billy Carpenter and look forward to participating in their care.  A copy of this report was sent to the requesting provider on this date.  Electronically Signed: Earley Abide, PA-C 05/16/2019, 9:40 AM   I spent a total of 40 Minutes in face to face in clinical  consultation, greater than 50% of which was counseling/coordinating care for left VA stenosis/diagnostic cerebral arteriogram.

## 2019-05-20 ENCOUNTER — Other Ambulatory Visit: Payer: Self-pay

## 2019-05-20 ENCOUNTER — Telehealth (INDEPENDENT_AMBULATORY_CARE_PROVIDER_SITE_OTHER): Payer: Medicare Other | Admitting: Student

## 2019-05-20 DIAGNOSIS — R55 Syncope and collapse: Secondary | ICD-10-CM

## 2019-05-20 LAB — CUP PACEART REMOTE DEVICE CHECK
Date Time Interrogation Session: 20201222112933
Implantable Pulse Generator Implant Date: 20201211

## 2019-05-20 NOTE — Progress Notes (Signed)
Virtual wound Loop check by patient request. Steri-strips previously removed by patient, and picture in "MyChart" reviewed. Interrogation reviewed via [place] and attached. 1 symptom episode. Pt was seated watching the stars when he felt dizzy. Strip shows PVCs, but no tachy or bradycardia. Monthly summary reports and ROV with Dr. Caryl Comes.

## 2019-05-20 NOTE — Telephone Encounter (Signed)
See virtual visit encounter from 05/20/19.

## 2019-05-21 ENCOUNTER — Telehealth: Payer: Self-pay | Admitting: Neurology

## 2019-05-21 ENCOUNTER — Encounter (HOSPITAL_COMMUNITY): Payer: Self-pay

## 2019-05-21 NOTE — Telephone Encounter (Signed)
I called the patient.  The cerebral angiogram shows some areas of extrinsic compression of the left vertebral artery, the right vertebral appears to be patent, feeding the basilar artery.  This anatomy should not lead to syncope.  The patient however indicates that he had an episode yesterday looking up at the sky, he began to get dizzy, he did not blackout.  He has a loop recorder in place.  We will see if this results in obtaining any information that gives [place] a clue to his syncope.  He is not driving motor vehicle at this time.   Cerebral angiogram 05/20/19:  IMPRESSION: Two areas of significant narrowing of the proximal left vertebral artery at the level of C4-5, and at the upper aspect of C3 most likely due to extrinsic compression.  Moderate to moderately severe tortuosity of the internal carotid arteries in the mid and distal cervical segments bilaterally may be secondary to sustained chronic hypertension.

## 2019-05-21 NOTE — Telephone Encounter (Signed)
I agree with your assessment and plan

## 2019-05-21 NOTE — Telephone Encounter (Signed)
Patient received his CT results through [facility]. He is requesting a call back to discuss.

## 2019-06-06 ENCOUNTER — Other Ambulatory Visit: Payer: Self-pay

## 2019-06-06 MED ORDER — METOPROLOL SUCCINATE ER 25 MG PO TB24: 25.0000 mg | ORAL_TABLET | Freq: Every day | ORAL | 3 refills | Status: DC

## 2019-06-16 ENCOUNTER — Ambulatory Visit (INDEPENDENT_AMBULATORY_CARE_PROVIDER_SITE_OTHER): Payer: Medicare Other | Admitting: *Deleted

## 2019-06-16 DIAGNOSIS — I639 Cerebral infarction, unspecified: Secondary | ICD-10-CM | POA: Diagnosis not present

## 2019-06-16 LAB — CUP PACEART REMOTE DEVICE CHECK
Date Time Interrogation Session: 20210117224032
Implantable Pulse Generator Implant Date: 20201211

## 2019-06-17 NOTE — Progress Notes (Signed)
ILR remote 

## 2019-06-19 DIAGNOSIS — Z23 Encounter for immunization: Secondary | ICD-10-CM | POA: Diagnosis not present

## 2019-07-17 ENCOUNTER — Ambulatory Visit (INDEPENDENT_AMBULATORY_CARE_PROVIDER_SITE_OTHER): Payer: Medicare Other | Admitting: *Deleted

## 2019-07-17 DIAGNOSIS — R55 Syncope and collapse: Secondary | ICD-10-CM

## 2019-07-17 LAB — CUP PACEART REMOTE DEVICE CHECK
Date Time Interrogation Session: 20210217000051
Date Time Interrogation Session: 20210217230307
Implantable Pulse Generator Implant Date: 20201211
Implantable Pulse Generator Implant Date: 20201211

## 2019-07-17 NOTE — Progress Notes (Signed)
ILR Remote 

## 2019-07-21 NOTE — Telephone Encounter (Signed)
Called to speak to patients  Unfortunately could not find orthostatic vital signs from our office  Will have him do them-- left msg on identified VM

## 2019-07-22 NOTE — Telephone Encounter (Signed)
See separate Pt advice request. Duplicate.

## 2019-08-18 ENCOUNTER — Ambulatory Visit (INDEPENDENT_AMBULATORY_CARE_PROVIDER_SITE_OTHER): Payer: Medicare Other | Admitting: *Deleted

## 2019-08-18 DIAGNOSIS — R55 Syncope and collapse: Secondary | ICD-10-CM

## 2019-08-18 LAB — CUP PACEART REMOTE DEVICE CHECK
Date Time Interrogation Session: 20210320230035
Date Time Interrogation Session: 20210321230434
Implantable Pulse Generator Implant Date: 20201211
Implantable Pulse Generator Implant Date: 20201211

## 2019-08-19 NOTE — Progress Notes (Signed)
ILR Remote 

## 2019-08-20 ENCOUNTER — Telehealth: Payer: Self-pay | Admitting: Neurology

## 2019-08-20 NOTE — Telephone Encounter (Signed)
Pt called stating that he is wanting to speak to Provider not the RN about the symptoms he has mentioned in his [place]. Pt understand that this is provider's off week and is ok with waiting till next week for a call.

## 2019-08-21 MED ORDER — TOPIRAMATE 25 MG PO TABS: ORAL_TABLET | ORAL | 3 refills | Status: DC

## 2019-08-21 NOTE — Telephone Encounter (Signed)
I called the patient.  The patient appears to have symptoms from multiple sources.  He reports right leg numbness that occurs with walking, associated with some back pain.  He also has significant knee arthritis in that leg.  He reports feeling woozy and staggery shortly after standing up, at times only while standing he may feel somewhat zoned out and have trouble with understanding and generating language.  He also has episodes of blotchy vision deficits, this may occur while standing but may persist while sitting, and may be followed by headache.  The right leg numbness sounds like it is related to low back issues, the wooziness and cognitive changes may be associated with a slight drop in blood pressure.  The episodes of vision change and headache could be migrainous.  I will give a trial on Topamax.  The patient will check his blood pressures when he feels symptomatic.  He has a loop recorder in place and no cardiac rhythm abnormalities have been noted.

## 2019-09-01 ENCOUNTER — Other Ambulatory Visit: Payer: Self-pay | Admitting: Internal Medicine

## 2019-09-02 NOTE — Telephone Encounter (Signed)
Pt's pharmacy is requesting a refill on flecainide. On pt's medication list this medication has no strength on it. Please address

## 2019-09-22 ENCOUNTER — Ambulatory Visit (INDEPENDENT_AMBULATORY_CARE_PROVIDER_SITE_OTHER): Payer: Medicare Other | Admitting: *Deleted

## 2019-09-22 DIAGNOSIS — R55 Syncope and collapse: Secondary | ICD-10-CM | POA: Diagnosis not present

## 2019-09-22 LAB — CUP PACEART REMOTE DEVICE CHECK
Date Time Interrogation Session: 20210421230427
Implantable Pulse Generator Implant Date: 20201211

## 2019-09-22 NOTE — Progress Notes (Signed)
ILR Remote 

## 2019-09-23 ENCOUNTER — Telehealth: Payer: Self-pay | Admitting: Internal Medicine

## 2019-09-23 NOTE — Telephone Encounter (Signed)
Patient states that he has to get approval from Dr. 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 He states that his one year renewal on his license is coming up and he has the form that he needs Dr. Caryl Comes to sign. He says that he can come by the office tomorrow and drop it off if that is okay. Please advise.

## 2019-09-24 NOTE — Telephone Encounter (Signed)
Has been addressed through pt's MyChart message, see encounter.

## 2019-10-20 ENCOUNTER — Telehealth: Payer: Self-pay

## 2019-10-20 ENCOUNTER — Ambulatory Visit (INDEPENDENT_AMBULATORY_CARE_PROVIDER_SITE_OTHER): Payer: Medicare Other | Admitting: *Deleted

## 2019-10-20 DIAGNOSIS — R55 Syncope and collapse: Secondary | ICD-10-CM

## 2019-10-20 LAB — CUP PACEART REMOTE DEVICE CHECK
Date Time Interrogation Session: 20210522230520
Implantable Pulse Generator Implant Date: 20201211

## 2019-10-20 MED ORDER — FLECAINIDE ACETATE 150 MG PO TABS: 75.0000 mg | ORAL_TABLET | Freq: Two times a day (BID) | ORAL | 0 refills | Status: DC

## 2019-10-20 NOTE — Telephone Encounter (Signed)
Pt's pharmacy is requesting a refill on Flecainide. This medication has no strength on it to reorder. Please put the correct medication strength on pt's med list. Please address

## 2019-10-20 NOTE — Telephone Encounter (Signed)
Discontinued historical provider Flecainide Rx.  Confirmed with pt he takes Flecainide 75mg  po bid.  Provided 30 day refill as pt has appointment to see Dr Caryl Comes for 6 month f/u 11/17/2019.

## 2019-10-20 NOTE — Telephone Encounter (Signed)
Looks like Dr Margette Fast is the original prescriber.

## 2019-10-20 NOTE — Telephone Encounter (Signed)
Pt has been on this medication since 2019, pt was taking flecainide 100 mg tablet. Please address

## 2019-10-21 NOTE — Progress Notes (Signed)
[facility] Summary Report / Loop Recorder 

## 2019-11-04 ENCOUNTER — Encounter: Payer: Self-pay | Admitting: Neurology

## 2019-11-04 ENCOUNTER — Ambulatory Visit (INDEPENDENT_AMBULATORY_CARE_PROVIDER_SITE_OTHER): Payer: Medicare Other | Admitting: Neurology

## 2019-11-04 VITALS — BP 125/87 | HR 68 | Ht 68.0 in | Wt 151.0 lb

## 2019-11-04 DIAGNOSIS — R55 Syncope and collapse: Secondary | ICD-10-CM | POA: Diagnosis not present

## 2019-11-04 DIAGNOSIS — R42 Dizziness and giddiness: Secondary | ICD-10-CM | POA: Diagnosis not present

## 2019-11-04 DIAGNOSIS — I639 Cerebral infarction, unspecified: Secondary | ICD-10-CM | POA: Diagnosis not present

## 2019-11-04 NOTE — Progress Notes (Signed)
Reason for visit: Postural dizziness, syncope  Billy Carpenter is an 74 y.o. male  History of present illness:  Billy Carpenter is a 74 year old right-handed white male with a history of episodes of syncope and postural dizziness.  The patient has a history of atrial fibrillation, he has a loop recorder in and has not yet had a syncopal event while wearing the loop recorder.  No evidence of atrial fibrillation or other cardiac rhythm abnormalities have been noted.  The patient is followed by Dr. Caryl Carpenter from cardiology.  The patient has done quite well since last seen, he has increased his hydration, he is drinking 48 ounces of water a day and 48 ounces of decaf coffee a day.  The patient has not had any symptoms whatsoever while doing this.  The patient will occasionally have some dizziness with looking up or when walking in a dark room.  He also has reported some right leg numbness with walking, this has improved with back exercises.  He returns to the office today for an evaluation.  Past Medical History:  Diagnosis Date   BPH (benign prostatic hyperplasia)    Carotid artery disease ([place])    Left internal carotid artery spontaneous dissection in the past  //   Doppler, October, 2013, minimal plaque, 0-39% bilateral   Chronic low back pain 05/03/2017   CVA (cerebral infarction)    left insular cortex stroke with left internal carotid artery dissection and near occlusion   Dyslipidemia    Hematuria    Hypertension    Mitral regurgitation    mild, echo, March, 2010   Persistent atrial fibrillation (HCC)    Syncope    Syncope and collapse 12/21/2017    Past Surgical History:  Procedure Laterality Date   BLADDER STONE REMOVAL     IR ANGIO INTRA EXTRACRAN SEL COM CAROTID INNOMINATE BILAT MOD SED  05/16/2019   IR ANGIO VERTEBRAL SEL VERTEBRAL BILAT MOD SED  05/16/2019   IR US GUIDE VASC ACCESS RIGHT  05/16/2019   LEFT HEART CATH AND CORONARY ANGIOGRAPHY N/A 06/27/2017   Procedure: LEFT HEART CATH AND CORONARY ANGIOGRAPHY;  Surgeon: Billy Crome, MD;  Location: [facility];  Service: Cardiovascular;  Laterality: N/A;   PROSTATE SURGERY  2009   WRIST SURGERY     Right    Family History  Problem Relation Age of Onset   Arrhythmia Mother    Hypertension Mother    Arthritis Mother    Other Father        tumor   Prostate cancer Father    CVA Neg Hx     Social history:  reports that he has never smoked. He has never used smokeless tobacco. He reports previous alcohol use. He reports that he does not use drugs.   No Known Allergies  Medications:  Prior to Admission medications   Medication Sig Start Date End Date Taking? Authorizing Provider  atorvastatin (LIPITOR) 20 MG tablet Take 1 tablet (20 mg total) by mouth daily. 06/20/16  Yes Nahser, Wonda Cheng, MD  flecainide (TAMBOCOR) 150 MG tablet Take 0.5 tablets (75 mg total) by mouth 2 (two) times daily. Pt scheduled to see Dr Billy Carpenter 11/17/2019. 10/20/19  Yes Deboraha Sprang, MD  metoprolol succinate (TOPROL-XL) 25 MG 24 hr tablet Take 1 tablet (25 mg total) by mouth daily. 06/06/19  Yes Deboraha Sprang, MD  XARELTO 20 MG TABS tablet TAKE 1 TABLET BY MOUTH EVERY DAY WITH SUPPER 01/15/18  Yes  Sherran Needs, NP    ROS:  Out of a complete 14 system review of symptoms, the patient complains only of the following symptoms, and all other reviewed systems are negative.  Dizziness Back pain  Blood pressure 125/87, pulse 68, height 5\' 8"  (1.727 m), weight 151 lb (68.5 kg).   Blood pressure, right arm, sitting is 128/88.  Blood pressure, right arm, standing is 130/80.  Physical Exam  General: The patient is alert and cooperative at the time of the examination.  Skin: No significant peripheral edema is noted.   Neurologic Exam  Mental status: The patient is alert and oriented x 3 at the time of the examination. The patient has apparent normal recent and remote memory, with an apparently  normal attention span and concentration ability.   Cranial nerves: Facial symmetry is present. Speech is normal, no aphasia or dysarthria is noted. Extraocular movements are full. Visual fields are full.  Motor: The patient has good strength in all 4 extremities.  Sensory examination: Soft touch sensation is symmetric on the face, arms, and legs.  Coordination: The patient has good finger-nose-finger and heel-to-shin bilaterally.  Gait and station: The patient has a normal gait. Tandem gait is normal. Romberg is negative. No drift is seen.  Reflexes: Deep tendon reflexes are symmetric.   Cerebral angiogram 05/20/19:  IMPRESSION: Two areas of significant narrowing of the proximal left vertebral artery at the level of C4-5, and at the upper aspect of C3 most likely due to extrinsic compression.  Moderate to moderately severe tortuosity of the internal carotid arteries in the mid and distal cervical segments bilaterally may be secondary to sustained chronic hypertension.   Assessment/Plan:  1.  Postural dizziness, syncope  The patient is doing well.  He has markedly increased his fluid intake and he has been able to avoid many of the previous symptoms.  The patient has gotten his driver's license back.  He will follow-up through this office on as-needed basis.  He continues to have the loop recorder in place.  Jill Alexanders MD 11/04/2019 7:32 AM  [facility] [address] [place],  [address]  Phone [phone] Fax [phone]

## 2019-11-14 ENCOUNTER — Other Ambulatory Visit: Payer: Self-pay | Admitting: Neurology

## 2019-11-16 DIAGNOSIS — Z95818 Presence of other cardiac implants and grafts: Secondary | ICD-10-CM | POA: Insufficient documentation

## 2019-11-17 ENCOUNTER — Other Ambulatory Visit: Payer: Self-pay | Admitting: Internal Medicine

## 2019-11-17 ENCOUNTER — Encounter: Payer: Self-pay | Admitting: Internal Medicine

## 2019-11-17 ENCOUNTER — Ambulatory Visit (INDEPENDENT_AMBULATORY_CARE_PROVIDER_SITE_OTHER): Payer: Medicare Other | Admitting: Internal Medicine

## 2019-11-17 ENCOUNTER — Other Ambulatory Visit: Payer: Self-pay

## 2019-11-17 VITALS — BP 120/86 | HR 62 | Ht 67.5 in | Wt 150.0 lb

## 2019-11-17 DIAGNOSIS — I639 Cerebral infarction, unspecified: Secondary | ICD-10-CM | POA: Diagnosis not present

## 2019-11-17 DIAGNOSIS — Z95818 Presence of other cardiac implants and grafts: Secondary | ICD-10-CM

## 2019-11-17 DIAGNOSIS — R55 Syncope and collapse: Secondary | ICD-10-CM | POA: Diagnosis not present

## 2019-11-17 MED ORDER — FLECAINIDE ACETATE 50 MG PO TABS: 50.0000 mg | ORAL_TABLET | Freq: Two times a day (BID) | ORAL | 3 refills | Status: DC

## 2019-11-17 NOTE — Progress Notes (Signed)
Patient Care Team: [name], MD as PCP - General (Family Medicine) Suella Broad, MD as Consulting Physician (Physical Medicine and Rehabilitation)   HPI  Billy Carpenter is a 74 y.o. male seen in follow-up for recurrent syncope and nonsustained ventricular tachycardia.  He also has atrial fibrillation treated with flecainide and anticoagulated with Xarelto.  Symptoms of lightheadedness and dizziness have improved remarkably with aggressive hydration.  No interval syncope.    Date Cr K Hgb  12/20 1.07 3.9 16.6           DATE TEST EF   10/15 Echo  60-65%   9//18 Echo   30-35 % Mild RV dysfn  1/19 LHC  50 % Normal CA  1/19 Echo  45-50%      DATE PR interval QRSduration Dose  6/21 210 86 30          Has a history of a stroke and a remote carotid artery dissection.  Thromboembolic risk factors ( age -5, HTN-1, TIA/CVA-2, Vasc disease -1, CHF-1) for a CHADSVASc Score of >=7  He is also been seen by neurology.  Not withstanding negative EEG he was put on Keppra because of the concern that a seizure following his stroke could have been responsible for his syncope.  Subsequently stopped  Records and Results Reviewed   Past Medical History:  Diagnosis Date  . BPH (benign prostatic hyperplasia)   . Carotid artery disease ([place])    Left internal carotid artery spontaneous dissection in the past  //   Doppler, October, 2013, minimal plaque, 0-39% bilateral  . Chronic low back pain 05/03/2017  . CVA (cerebral infarction)    left insular cortex stroke with left internal carotid artery dissection and near occlusion  . Dyslipidemia   . Hematuria   . Hypertension   . Mitral regurgitation    mild, echo, March, 2010  . Persistent atrial fibrillation ([place])   . Syncope   . Syncope and collapse 12/21/2017    Past Surgical History:  Procedure Laterality Date  . BLADDER STONE REMOVAL    . IR ANGIO INTRA EXTRACRAN SEL COM CAROTID INNOMINATE BILAT MOD SED   05/16/2019  . IR ANGIO VERTEBRAL SEL VERTEBRAL BILAT MOD SED  05/16/2019  . IR US GUIDE VASC ACCESS RIGHT  05/16/2019  . LEFT HEART CATH AND CORONARY ANGIOGRAPHY N/A 06/27/2017   Procedure: LEFT HEART CATH AND CORONARY ANGIOGRAPHY;  Surgeon: Belva Crome, MD;  Location: [facility];  Service: Cardiovascular;  Laterality: N/A;  . PROSTATE SURGERY  2009  . WRIST SURGERY     Right    Current Meds  Medication Sig  . atorvastatin (LIPITOR) 20 MG tablet Take 1 tablet (20 mg total) by mouth daily.  . flecainide (TAMBOCOR) 150 MG tablet Take 0.5 tablets (75 mg total) by mouth 2 (two) times daily. Pt scheduled to see Dr Caryl Comes 11/17/2019.  . metoprolol succinate (TOPROL-XL) 25 MG 24 hr tablet Take 1 tablet (25 mg total) by mouth daily.  Alveda Reasons 20 MG TABS tablet TAKE 1 TABLET BY MOUTH EVERY DAY WITH SUPPER    No Known Allergies    Review of Systems negative except from HPI and PMH  Physical Exam BP 120/86   Pulse 62   Ht 5' 7.5" (1.715 m)   Wt 150 lb (68 kg)   SpO2 95%   BMI 23.15 kg/m  Well developed and well nourished in no acute distress HENT normal E scleral and  icterus clear Neck Supple JVP flat; carotids brisk and full Clear to ausculation  Regular rate and rhythm, no murmurs gallops or rub Soft with active bowel sounds No clubbing cyanosis  Edema Alert and oriented, grossly normal motor and sensory function Skin Warm and Dry  ECG sinus at 62 Interval 21/09/43  CrCl cannot be calculated (Patient's most recent lab result is older than the maximum 21 days allowed.).   Assessment and  Plan  Syncope abrupt onset/offset-recurrent  Orthostatic lightheadedness-improved  Atrial fibrillation-paroxysmal  Prior stroke  Cardiomyopathy-  nonischemic-mild  ventricular tachycardia-nonsustained  No interval syncope.  No bleeding.  Continue Xarelto  Discussed alternatives to anticoagulation; may be a candidate for watchman.  No interval atrial  fibrillation.  He would like to think about decreasing and stopping his dosing; we will go from 75--50 mg flecainide twice daily.  We will continue metoprolol.  No interval nonsustained VT  Current medicines are reviewed at length with the patient today .  The patient does not  have concerns regarding medicines.

## 2019-11-17 NOTE — Patient Instructions (Signed)
Medication Instructions:  Your physician has recommended you make the following change in your medication:   Begin taking Flecainide 50mg   - 1 tablet twice daily  *If you need a refill on your cardiac medications before your next appointment, please call your pharmacy*   Lab Work: None ordered.  If you have labs (blood work) drawn today and your tests are completely normal, you will receive your results only by:  [place] (if you have MyChart) OR  A paper copy in the mail If you have any lab test that is abnormal or we need to change your treatment, we will call you to review the results.   Testing/Procedures: None ordered.    Follow-Up: At [facility], you and your health needs are our priority.  As part of our continuing mission to provide you with exceptional heart care, we have created designated Provider Care Teams.  These Care Teams include your primary Cardiologist (physician) and Advanced Practice Providers (APPs -  Physician Assistants and Nurse Practitioners) who all work together to provide you with the care you need, when you need it.  We recommend signing up for the patient portal called "MyChart".  Sign up information is provided on this After Visit Summary.  MyChart is used to connect with patients for Virtual Visits (Telemedicine).  Patients are able to view lab/test results, encounter notes, upcoming appointments, etc.  Non-urgent messages can be sent to your provider as well.   To learn more about what you can do with MyChart, go to [email or web address].    Your next appointment:   6 month(s)  The format for your next appointment:   In Person  Provider:   Virl Axe, MD

## 2019-11-21 ENCOUNTER — Telehealth: Payer: Self-pay | Admitting: Emergency Medicine

## 2019-11-21 NOTE — Telephone Encounter (Signed)
LMOM to call office. Alert for tachy event on 11/20/19 at 1140 that was 2 minutes and 21 seconds in duration and appeared to be ST in 170s. Will assess for symptoms during episode.

## 2019-11-21 NOTE — Telephone Encounter (Signed)
Patient was mowing the lawn when the episode of ST occurred and was asymptomatic. Patient started his yard work around [place] and finished around 1330. No CP, SOB, dizziness or syncope during this activity.

## 2019-11-24 ENCOUNTER — Ambulatory Visit (INDEPENDENT_AMBULATORY_CARE_PROVIDER_SITE_OTHER): Payer: Medicare Other | Admitting: *Deleted

## 2019-11-24 DIAGNOSIS — R55 Syncope and collapse: Secondary | ICD-10-CM | POA: Diagnosis not present

## 2019-11-24 LAB — CUP PACEART REMOTE DEVICE CHECK
Date Time Interrogation Session: 20210627230535
Implantable Pulse Generator Implant Date: 20201211

## 2019-11-26 NOTE — Progress Notes (Signed)
[facility] Summary Report / Loop Recorder 

## 2019-12-03 LAB — CUP PACEART INCLINIC DEVICE CHECK
Date Time Interrogation Session: 20210621171538
Implantable Pulse Generator Implant Date: 20201211

## 2019-12-08 ENCOUNTER — Telehealth: Payer: Self-pay

## 2019-12-08 NOTE — Telephone Encounter (Signed)
Carelink alert received Tachy alert 2 mins, EGM appears SVT w/ runs of NSVT as well.   Pt with known history of NSVT, current meds include Flecainide 50mg  BID, Metoprolol 25mg  daily, Xarelto 20mg  daily.    Spoke with pt, he reports at time he was unloading car load of heavy items.  He had been hiking earlier in the day and may not have been well hydrated at time of episode.  He denies any cardiac symptoms at the time.    Will continue to monitor.

## 2019-12-10 ENCOUNTER — Other Ambulatory Visit: Payer: Self-pay | Admitting: Internal Medicine

## 2019-12-29 ENCOUNTER — Ambulatory Visit (INDEPENDENT_AMBULATORY_CARE_PROVIDER_SITE_OTHER): Payer: Medicare Other | Admitting: *Deleted

## 2019-12-29 DIAGNOSIS — R55 Syncope and collapse: Secondary | ICD-10-CM

## 2019-12-31 LAB — CUP PACEART REMOTE DEVICE CHECK
Date Time Interrogation Session: 20210730230339
Date Time Interrogation Session: 20210730230339
Implantable Pulse Generator Implant Date: 20201211
Implantable Pulse Generator Implant Date: 20201211

## 2019-12-31 NOTE — Progress Notes (Signed)
[facility] Summary Report / Loop Recorder 

## 2020-01-15 DIAGNOSIS — H25813 Combined forms of age-related cataract, bilateral: Secondary | ICD-10-CM | POA: Diagnosis not present

## 2020-01-26 ENCOUNTER — Telehealth: Payer: Self-pay | Admitting: *Deleted

## 2020-01-26 NOTE — Telephone Encounter (Signed)
LINQ alerts received for 1 symptom episode from 01/17/20 at 11:20 and 43 tachy episodes on 01/23/20 between 07:46-10:14. Symptom episode appears ? AF w/RVR 150s. Tachy episodes appear runs of SVT 180s, longest 2 min. See example ECGs below.  Pt correlates all episodes with symptoms of wooziness and SOB, as well as heavy perspiration. On both days, pt had been working in the yard mowing his lawn, felt that he was well hydrated. BP as low as 82/60 when he first got back to the house on 8/27. Rested for awhile, BP gradually improved. Cutting lawn takes 2-2.5 hrs. Pt plans to try splitting mowing into two days going forward and will only do yard work in the early morning. BP typically runs 120/90 on Toprol XL 25mg  daily, which he takes in the AM. Also reports compliance with flecainide and Xarelto.  Advised will forward message to Dr. Caryl Comes for advisement. Pt in agreement with plan and appreciative of call.

## 2020-02-02 LAB — CUP PACEART REMOTE DEVICE CHECK
Date Time Interrogation Session: 20210901230613
Implantable Pulse Generator Implant Date: 20201211

## 2020-02-03 ENCOUNTER — Ambulatory Visit (INDEPENDENT_AMBULATORY_CARE_PROVIDER_SITE_OTHER): Payer: Medicare Other | Admitting: *Deleted

## 2020-02-03 DIAGNOSIS — R55 Syncope and collapse: Secondary | ICD-10-CM | POA: Diagnosis not present

## 2020-02-04 NOTE — Telephone Encounter (Signed)
The pt is following up because he did not get a call back. He is requesting that the nurse give him a call to let him know what Dr. Caryl Comes wants to do about the episode he had.

## 2020-02-04 NOTE — Telephone Encounter (Signed)
Discussed the mechanism of the rhythm, the sinus tach and the SVT Will have him take his toprol at night  Continue flecainide

## 2020-02-05 NOTE — Progress Notes (Signed)
[facility] Summary Report / Loop Recorder 

## 2020-02-12 ENCOUNTER — Telehealth: Payer: Self-pay | Admitting: *Deleted

## 2020-02-12 NOTE — Telephone Encounter (Signed)
LINQ alert received for 8 tachy episodes on 02/11/20. Available ECGs show SVT 160s-180s, longest 3.5 min.  Attempted to reach pt.  No answer, unable to LM.  Will try again at a later time.

## 2020-02-19 DIAGNOSIS — Z23 Encounter for immunization: Secondary | ICD-10-CM | POA: Diagnosis not present

## 2020-02-20 NOTE — Telephone Encounter (Signed)
Spoke with pt regarding tachy episodes on 02/11/20.  Pt reports he was playing golf in the morning and possibly mowing the lawn in the afternoon.  He does not recall any symptoms from that day.  Reports compliance with cardiac medications, including flecainide 50mg  BID, Xarelto 20mg  daily, and Toprol XL 25mg  every evening.  Reports that he has also been trying to drink Gatorade when he is outside when it is hot.  Advised pt that I will route message to Dr. Caryl Comes for review of episodes and any new recommendations.  Pt denies additional questions at this time.

## 2020-02-20 NOTE — Telephone Encounter (Signed)
LMOVM requesting call back to DC.  Direct number and office hours provided.

## 2020-02-23 NOTE — Telephone Encounter (Signed)
Laides  can I get you to interrogate his device to see if he is still in afib  Thanks SK

## 2020-02-23 NOTE — Telephone Encounter (Signed)
Pt has a Linq II that transmits nightly.  Below is transmission from this AM

## 2020-02-23 NOTE — Telephone Encounter (Signed)
Utb  Thanks SK

## 2020-03-05 LAB — CUP PACEART REMOTE DEVICE CHECK
Date Time Interrogation Session: 20211004230205
Implantable Pulse Generator Implant Date: 20201211

## 2020-03-08 ENCOUNTER — Ambulatory Visit (INDEPENDENT_AMBULATORY_CARE_PROVIDER_SITE_OTHER): Payer: Medicare Other

## 2020-03-08 DIAGNOSIS — R55 Syncope and collapse: Secondary | ICD-10-CM

## 2020-03-10 NOTE — Progress Notes (Signed)
[facility] Summary Report / Loop Recorder 

## 2020-03-13 ENCOUNTER — Other Ambulatory Visit: Payer: Self-pay | Admitting: Internal Medicine

## 2020-03-15 ENCOUNTER — Other Ambulatory Visit: Payer: Self-pay | Admitting: *Deleted

## 2020-03-27 DIAGNOSIS — Z23 Encounter for immunization: Secondary | ICD-10-CM | POA: Diagnosis not present

## 2020-04-04 LAB — CUP PACEART REMOTE DEVICE CHECK
Date Time Interrogation Session: 20211106230111
Implantable Pulse Generator Implant Date: 20201211

## 2020-04-12 ENCOUNTER — Ambulatory Visit (INDEPENDENT_AMBULATORY_CARE_PROVIDER_SITE_OTHER): Payer: Medicare Other

## 2020-04-12 DIAGNOSIS — R55 Syncope and collapse: Secondary | ICD-10-CM | POA: Diagnosis not present

## 2020-04-14 NOTE — Progress Notes (Signed)
[facility] Summary Report / Loop Recorder 

## 2020-05-14 DIAGNOSIS — I4729 Other ventricular tachycardia: Secondary | ICD-10-CM | POA: Insufficient documentation

## 2020-05-17 LAB — CUP PACEART REMOTE DEVICE CHECK
Date Time Interrogation Session: 20211218230256
Implantable Pulse Generator Implant Date: 20201211

## 2020-05-20 ENCOUNTER — Other Ambulatory Visit: Payer: Self-pay

## 2020-05-20 ENCOUNTER — Telehealth (INDEPENDENT_AMBULATORY_CARE_PROVIDER_SITE_OTHER): Payer: Medicare Other | Admitting: Internal Medicine

## 2020-05-20 VITALS — BP 128/93 | HR 71 | Ht 67.5 in | Wt 145.0 lb

## 2020-05-20 DIAGNOSIS — I4729 Other ventricular tachycardia: Secondary | ICD-10-CM

## 2020-05-20 DIAGNOSIS — I480 Paroxysmal atrial fibrillation: Secondary | ICD-10-CM | POA: Diagnosis not present

## 2020-05-20 DIAGNOSIS — R42 Dizziness and giddiness: Secondary | ICD-10-CM

## 2020-05-20 DIAGNOSIS — Z95818 Presence of other cardiac implants and grafts: Secondary | ICD-10-CM

## 2020-05-20 DIAGNOSIS — I472 Ventricular tachycardia: Secondary | ICD-10-CM

## 2020-05-20 DIAGNOSIS — Z79899 Other long term (current) drug therapy: Secondary | ICD-10-CM

## 2020-05-20 DIAGNOSIS — I639 Cerebral infarction, unspecified: Secondary | ICD-10-CM

## 2020-05-20 DIAGNOSIS — R55 Syncope and collapse: Secondary | ICD-10-CM

## 2020-05-20 DIAGNOSIS — I5020 Unspecified systolic (congestive) heart failure: Secondary | ICD-10-CM

## 2020-05-20 NOTE — Progress Notes (Signed)
Electrophysiology TeleHealth Note   Due to national recommendations of social distancing due to COVID 19, an audio/video telehealth visit is felt to be most appropriate for this patient at this time.  See MyChart message from today for the patient's consent to telehealth for [facility].   Date:  05/20/2020   ID:  Billy Carpenter, DOB October 29, 1945, MRN [id number]  Location: patient's home  Provider location: [address], [place]  Evaluation Performed: Follow-up visit  PCP:  [name], MD  Cardiologist:    Electrophysiologist:  SK   Chief Complaint:  syncope  History of Present Illness:    Billy Carpenter is a 74 y.o. male who presents via audio/video conferencing for a telehealth visit today.  Since last being seen in our clinic for recurrent syncope now s/p ILR insertion 12/20 and VTNS Rx with flecainide and anticoagulation with Rivaroxaban , the patient reports no intercurrent syncope  Strong hydration with much less LH  and no afib but no bleeding on Rivaroxaban   The patient denies chest pain, shortness of breath, nocturnal dyspnea, orthopnea or peripheral edema.  There have been no palpitations, lightheadedness or syncope.    Date Cr K Hgb  12/20 1.07 3.9 16.6           DATE TEST EF   10/15 Echo 60-65%   9//18 Echo 30-35% Mild RV dysfn  1/19 LHC 50% Normal CA  1/19 Echo 45-50%   `12/20 Echo  55-60% AoRoot 52mm     DATE PR interval QRSduration Dose  6/21 210 86 75         Thromboembolic risk factors ( age -33, HTN-1, TIA/CVA-2, Vasc disease -1, CHF-1) for a CHADSVASc Score of>=7  Pt has not had Covid; has  taken Vaccine   Past Medical History:  Diagnosis Date  . BPH (benign prostatic hyperplasia)   . Carotid artery disease ([place])    Left internal carotid artery spontaneous dissection in the past  //   Doppler, October, 2013, minimal plaque, 0-39% bilateral  . Chronic low back pain 05/03/2017  . CVA (cerebral  infarction)    left insular cortex stroke with left internal carotid artery dissection and near occlusion  . Dyslipidemia   . Hematuria   . Hypertension   . Mitral regurgitation    mild, echo, March, 2010  . Persistent atrial fibrillation ([place])   . Syncope   . Syncope and collapse 12/21/2017    Past Surgical History:  Procedure Laterality Date  . BLADDER STONE REMOVAL    . IR ANGIO INTRA EXTRACRAN SEL COM CAROTID INNOMINATE BILAT MOD SED  05/16/2019  . IR ANGIO VERTEBRAL SEL VERTEBRAL BILAT MOD SED  05/16/2019  . IR US GUIDE VASC ACCESS RIGHT  05/16/2019  . LEFT HEART CATH AND CORONARY ANGIOGRAPHY N/A 06/27/2017   Procedure: LEFT HEART CATH AND CORONARY ANGIOGRAPHY;  Surgeon: Belva Crome, MD;  Location: [facility];  Service: Cardiovascular;  Laterality: N/A;  . PROSTATE SURGERY  2009  . WRIST SURGERY     Right    Current Outpatient Medications  Medication Sig Dispense Refill  . atorvastatin (LIPITOR) 20 MG tablet Take 1 tablet (20 mg total) by mouth daily. 90 tablet 3  . flecainide (TAMBOCOR) 50 MG tablet Take 1 tablet (50 mg total) by mouth 2 (two) times daily. 180 tablet 3  . metoprolol succinate (TOPROL-XL) 25 MG 24 hr tablet Take 1 tablet (25 mg total) by mouth daily. [place]  tablet 3  . XARELTO 20 MG TABS tablet TAKE 1 TABLET BY MOUTH EVERY DAY WITH SUPPER 30 tablet 0   No current facility-administered medications for this visit.    Allergies:   Patient has no known allergies.   Social History:  The patient  reports that he has never smoked. He has never used smokeless tobacco. He reports previous alcohol use. He reports that he does not use drugs.   Family History:  The patient's   family history includes Arrhythmia in his mother; Arthritis in his mother; Hypertension in his mother; Other in his father; Prostate cancer in his father.   ROS:  Please see the history of present illness.   All other systems are personally reviewed and negative.    Exam:    Vital  Signs:  BP (!) 128/93   Pulse 71   Ht 5' 7.5" (1.715 m)   Wt 145 lb (65.8 kg)   SpO2 98%   BMI 22.38 kg/m     Labs/Other Tests and Data Reviewed:    Recent Labs: No results found for requested labs within last 8760 hours.   Wt Readings from Last 3 Encounters:  05/20/20 145 lb (65.8 kg)  11/17/19 150 lb (68 kg)  11/04/19 151 lb (68.5 kg)     Other studies personally reviewed: Additional studies/ records that were reviewed today include:    The patient presents wearable device technology report for my review today.   Last device remote is reviewed from [place] PDF dated 12/21   ASSESSMENT & PLAN:   Syncope abrupt onset/offset-recurrent  Orthostatic lightheadedness-improved  Atrial fibrillation-paroxysmal  Prior stroke  Cardiomyopathy- nonischemic-mild  ventricular tachycardia-nonsustained  No afib   No interval syncope.  No bleeding.  Continue Xarelto  Discussed alternatives to anticoagulation; may be a candidate for watchman.  No interval atrial fibrillation.  He would like to think about decreasing and stopping his dosing; we will go from 75--50 mg flecainide twice daily.  We will continue metoprolol.  No interval nonsustained VT  COVID 19 screen The patient denies symptoms of COVID 19 at this time.  The importance of social distancing was discussed today.  Follow-up:  6 m OV Next remote: As Scheduled   Current medicines are reviewed at length with the patient today.   The patient does not have concerns regarding his medicines.  The following changes were made today:  none  Labs/ tests ordered today include: BMET/CBC No orders of the defined types were placed in this encounter.   Future tests ( post COVID )     Patient Risk:  after full review of this patients clinical status, I feel that they are at moderate risk at this time.  Today, I have spent 11 minutes with the patient with telehealth technology discussing the  above.  Signed, Virl Axe, MD  05/20/2020 4:36 PM     [address] [place] [phone] [phone] (office) [phone] (fax)

## 2020-05-20 NOTE — Patient Instructions (Signed)
Medication Instructions:  Your physician has recommended you make the following change in your medication:   ** Decrease your Flecainide from 75mg  to 50mg  - 1 tablet by mouth twice daily.  *If you need a refill on your cardiac medications before your next appointment, please call your pharmacy*   Lab Work: CBC and BMET - Please call office next week to schedule appointment for labs.  Orders have been placed.  If you have labs (blood work) drawn today and your tests are completely normal, you will receive your results only by: [place] MyChart Message (if you have MyChart) OR . A paper copy in the mail If you have any lab test that is abnormal or we need to change your treatment, we will call you to review the results.   Testing/Procedures: None ordered.    Follow-Up: At [facility], you and your health needs are our priority.  As part of our continuing mission to provide you with exceptional heart care, we have created designated Provider Care Teams.  These Care Teams include your primary Cardiologist (physician) and Advanced Practice Providers (APPs -  Physician Assistants and Nurse Practitioners) who all work together to provide you with the care you need, when you need it.  We recommend signing up for the patient portal called "MyChart".  Sign up information is provided on this After Visit Summary.  MyChart is used to connect with patients for Virtual Visits (Telemedicine).  Patients are able to view lab/test results, encounter notes, upcoming appointments, etc.  Non-urgent messages can be sent to your provider as well.   To learn more about what you can do with MyChart, go to [email or web address].    Your next appointment:   6 month(s)  The format for your next appointment:   In Person  Provider:   Dr Caryl Comes

## 2020-05-20 NOTE — Addendum Note (Signed)
Addended by: Thora Lance on: 05/20/2020 05:35 PM   Modules accepted: Orders

## 2020-05-24 NOTE — Telephone Encounter (Signed)
**Note De-identified Markhi Kleckner Obfuscation** -----  **Note De-Identified Antania Hoefling Obfuscation** Message from Alois Cliche, RN sent at 05/20/2020  5:32 PM EST ----- Regarding: Help with Xarelto Billy Carpenter,  Mr [name] had a virtual visit with Dr Graciela Husbands today.  He is needing some help with his Xarelto and not sure if he would qualify for any assistance.  Would you please give him a call.  I told him to go on line and complete assistance application.  He is in the donut hole right now.  Thanks for your help,  Mindi Junker.

## 2020-06-04 ENCOUNTER — Other Ambulatory Visit: Payer: Self-pay | Admitting: Internal Medicine

## 2020-06-08 ENCOUNTER — Other Ambulatory Visit: Payer: Medicare Other | Admitting: *Deleted

## 2020-06-08 ENCOUNTER — Other Ambulatory Visit: Payer: Self-pay

## 2020-06-08 DIAGNOSIS — Z79899 Other long term (current) drug therapy: Secondary | ICD-10-CM

## 2020-06-08 DIAGNOSIS — I480 Paroxysmal atrial fibrillation: Secondary | ICD-10-CM

## 2020-06-08 DIAGNOSIS — R55 Syncope and collapse: Secondary | ICD-10-CM

## 2020-06-08 DIAGNOSIS — R42 Dizziness and giddiness: Secondary | ICD-10-CM

## 2020-06-08 DIAGNOSIS — I5020 Unspecified systolic (congestive) heart failure: Secondary | ICD-10-CM

## 2020-06-08 LAB — BASIC METABOLIC PANEL
BUN/Creatinine Ratio: 18 (ref 10–24)
BUN: 17 mg/dL (ref 8–27)
CO2: 24 mmol/L (ref 20–29)
Calcium: 8.9 mg/dL (ref 8.6–10.2)
Chloride: 103 mmol/L (ref 96–106)
Creatinine, Ser: 0.97 mg/dL (ref 0.76–1.27)
GFR calc Af Amer: 87 mL/min/{1.73_m2} (ref >59)
GFR calc non Af Amer: 76 mL/min/{1.73_m2} (ref >59)
Glucose: 104 mg/dL — ABNORMAL HIGH (ref 65–99)
Potassium: 4.1 mmol/L (ref 3.5–5.2)
Sodium: 141 mmol/L (ref 134–144)

## 2020-06-08 LAB — CBC
Hematocrit: 46.5 % (ref 37.5–51.0)
Hemoglobin: 15.9 g/dL (ref 13.0–17.7)
MCH: 32.1 pg (ref 26.6–33.0)
MCHC: 34.2 g/dL (ref 31.5–35.7)
MCV: 94 fL (ref 79–97)
Platelets: 167 10*3/uL (ref 150–450)
RBC: 4.96 x10E6/uL (ref 4.14–5.80)
RDW: 12.3 % (ref 11.6–15.4)
WBC: 6 10*3/uL (ref 3.4–10.8)

## 2020-06-16 ENCOUNTER — Other Ambulatory Visit: Payer: Self-pay | Admitting: *Deleted

## 2020-06-16 DIAGNOSIS — G40909 Epilepsy, unspecified, not intractable, without status epilepticus: Secondary | ICD-10-CM | POA: Diagnosis not present

## 2020-06-16 DIAGNOSIS — E785 Hyperlipidemia, unspecified: Secondary | ICD-10-CM | POA: Diagnosis not present

## 2020-06-16 DIAGNOSIS — Z7901 Long term (current) use of anticoagulants: Secondary | ICD-10-CM | POA: Diagnosis not present

## 2020-06-16 DIAGNOSIS — Z0000 Encounter for general adult medical examination without abnormal findings: Secondary | ICD-10-CM | POA: Diagnosis not present

## 2020-06-16 DIAGNOSIS — I4891 Unspecified atrial fibrillation: Secondary | ICD-10-CM | POA: Diagnosis not present

## 2020-06-16 DIAGNOSIS — I639 Cerebral infarction, unspecified: Secondary | ICD-10-CM | POA: Diagnosis not present

## 2020-06-16 MED ORDER — RIVAROXABAN 20 MG PO TABS: ORAL_TABLET | ORAL | 0 refills | Status: DC

## 2020-06-16 NOTE — Telephone Encounter (Signed)
Prescription refill request for Xarelto received.   Indication: Afib Last office visit: Caryl Comes, 05/20/2020 Weight: 65.8 kg Age: 75 Scr: 0.97, 06/08/2020 CrCl: 60.3 ml/min   Prescription refill sent to [facility].

## 2020-06-18 ENCOUNTER — Telehealth: Payer: Self-pay | Admitting: Emergency Medicine

## 2020-06-18 NOTE — Telephone Encounter (Signed)
Patient was asymptomatic during event recorded 06/17/20 at 1342 which appears to be possible AF episode that lasted 10 seconds . + Eliquis , Toprol XL 25 mg daily. Will continue to monitor.

## 2020-06-21 ENCOUNTER — Ambulatory Visit (INDEPENDENT_AMBULATORY_CARE_PROVIDER_SITE_OTHER): Payer: Medicare Other

## 2020-06-21 DIAGNOSIS — R55 Syncope and collapse: Secondary | ICD-10-CM

## 2020-06-21 LAB — CUP PACEART REMOTE DEVICE CHECK
Date Time Interrogation Session: 20220124071100
Implantable Pulse Generator Implant Date: 20201211

## 2020-07-01 NOTE — Progress Notes (Signed)
[facility] Summary Report / Loop Recorder 

## 2020-07-21 ENCOUNTER — Ambulatory Visit (INDEPENDENT_AMBULATORY_CARE_PROVIDER_SITE_OTHER): Payer: Medicare Other

## 2020-07-21 DIAGNOSIS — R55 Syncope and collapse: Secondary | ICD-10-CM | POA: Diagnosis not present

## 2020-07-22 LAB — CUP PACEART REMOTE DEVICE CHECK
Date Time Interrogation Session: 20220222230630
Implantable Pulse Generator Implant Date: 20201211

## 2020-07-30 NOTE — Progress Notes (Signed)
[facility] Summary Report / Loop Recorder 

## 2020-08-23 ENCOUNTER — Ambulatory Visit (INDEPENDENT_AMBULATORY_CARE_PROVIDER_SITE_OTHER): Payer: Medicare Other

## 2020-08-23 DIAGNOSIS — R55 Syncope and collapse: Secondary | ICD-10-CM

## 2020-08-23 LAB — CUP PACEART REMOTE DEVICE CHECK
Date Time Interrogation Session: 20220327230557
Implantable Pulse Generator Implant Date: 20201211

## 2020-09-02 DIAGNOSIS — M25512 Pain in left shoulder: Secondary | ICD-10-CM | POA: Diagnosis not present

## 2020-09-06 NOTE — Progress Notes (Signed)
[facility] Summary Report / Loop Recorder 

## 2020-09-27 ENCOUNTER — Ambulatory Visit (INDEPENDENT_AMBULATORY_CARE_PROVIDER_SITE_OTHER): Payer: Medicare Other

## 2020-09-27 DIAGNOSIS — R55 Syncope and collapse: Secondary | ICD-10-CM

## 2020-09-28 DIAGNOSIS — Z23 Encounter for immunization: Secondary | ICD-10-CM | POA: Diagnosis not present

## 2020-09-28 LAB — CUP PACEART REMOTE DEVICE CHECK
Date Time Interrogation Session: 20220429230350
Implantable Pulse Generator Implant Date: 20201211

## 2020-10-19 NOTE — Progress Notes (Signed)
[facility] Summary Report / Loop Recorder 

## 2020-10-31 LAB — CUP PACEART REMOTE DEVICE CHECK
Date Time Interrogation Session: 20220601230454
Implantable Pulse Generator Implant Date: 20201211

## 2020-11-01 ENCOUNTER — Ambulatory Visit (INDEPENDENT_AMBULATORY_CARE_PROVIDER_SITE_OTHER): Payer: Medicare Other

## 2020-11-01 DIAGNOSIS — R55 Syncope and collapse: Secondary | ICD-10-CM | POA: Diagnosis not present

## 2020-11-16 ENCOUNTER — Other Ambulatory Visit: Payer: Self-pay

## 2020-11-16 ENCOUNTER — Encounter: Payer: Self-pay | Admitting: Internal Medicine

## 2020-11-16 ENCOUNTER — Ambulatory Visit (INDEPENDENT_AMBULATORY_CARE_PROVIDER_SITE_OTHER): Payer: Medicare Other | Admitting: Internal Medicine

## 2020-11-16 VITALS — BP 100/64 | HR 74 | Ht 67.5 in | Wt 148.0 lb

## 2020-11-16 DIAGNOSIS — R55 Syncope and collapse: Secondary | ICD-10-CM

## 2020-11-16 DIAGNOSIS — I5020 Unspecified systolic (congestive) heart failure: Secondary | ICD-10-CM

## 2020-11-16 DIAGNOSIS — I4729 Other ventricular tachycardia: Secondary | ICD-10-CM

## 2020-11-16 DIAGNOSIS — I472 Ventricular tachycardia: Secondary | ICD-10-CM

## 2020-11-16 DIAGNOSIS — I480 Paroxysmal atrial fibrillation: Secondary | ICD-10-CM

## 2020-11-16 DIAGNOSIS — Z95818 Presence of other cardiac implants and grafts: Secondary | ICD-10-CM | POA: Diagnosis not present

## 2020-11-16 DIAGNOSIS — R42 Dizziness and giddiness: Secondary | ICD-10-CM

## 2020-11-16 IMAGING — XA IR ANGIO INTRA EXTRACRAN SEL COM CAROTID INNOMINATE BILAT MOD SE
1 series · 12 of 24 positions shown · IV contrast (IODINE)
Comparison: CT angiogram of the head and neck January 07, 2019.

CLINICAL DATA: Vertebrobasilar insufficiency comprising of drop
attacks, vertigo, dizziness and blurred vision with positional
changes.
INDICATION: Vertebrobasilar insufficiency comprising drop attacks, dizziness
vertigo and blurred vision with upward gaze and positional changes.
Abnormal CT angiogram of the head and neck.

EXAM:
BILATERAL COMMON CAROTID AND INNOMINATE ANGIOGRAPHY
TECHNIQUE: Informed written consent was obtained from the patient after a
thorough discussion of the procedural risks, benefits and
alternatives. All questions were addressed. Maximal Sterile Barrier
Technique was utilized including caps, mask, sterile gowns, sterile
gloves, sterile drape, hand hygiene and skin antiseptic. A timeout
was performed prior to the initiation of the procedure.
INDICATION: Vertebrobasilar insufficiency comprising of drop attacks, dizziness,

[Series 300: ir angio intra extracran sel com carotid · 12 of 232 slices shown]
[im 11/232]
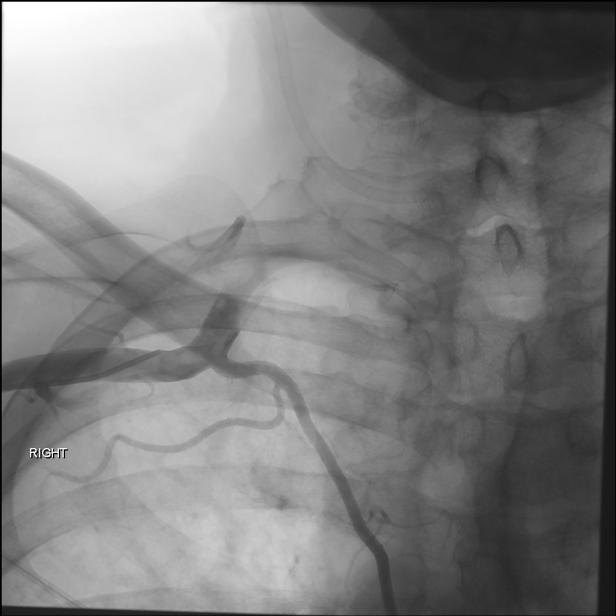
[im 31/232]
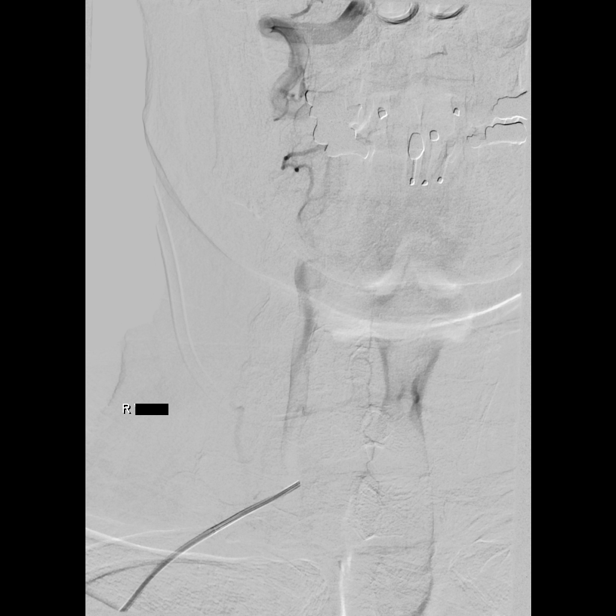
[im 51/232]
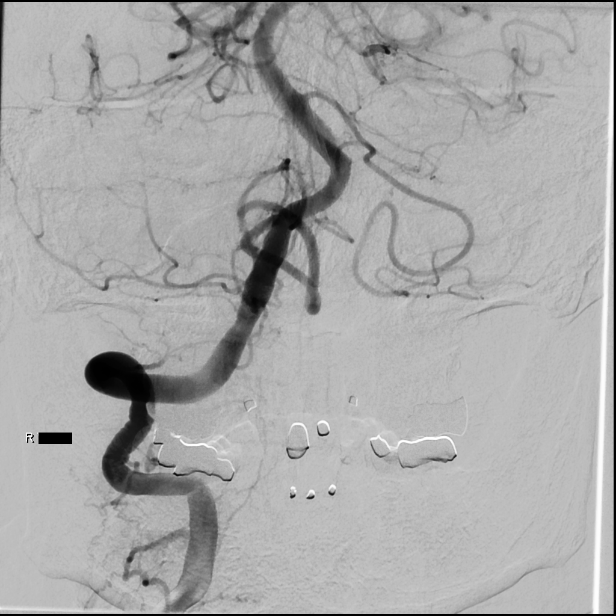
[im 71/232]
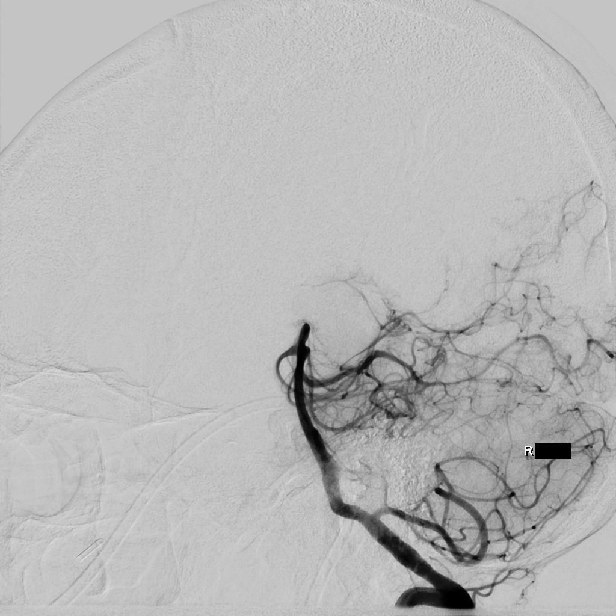
[im 91/232]
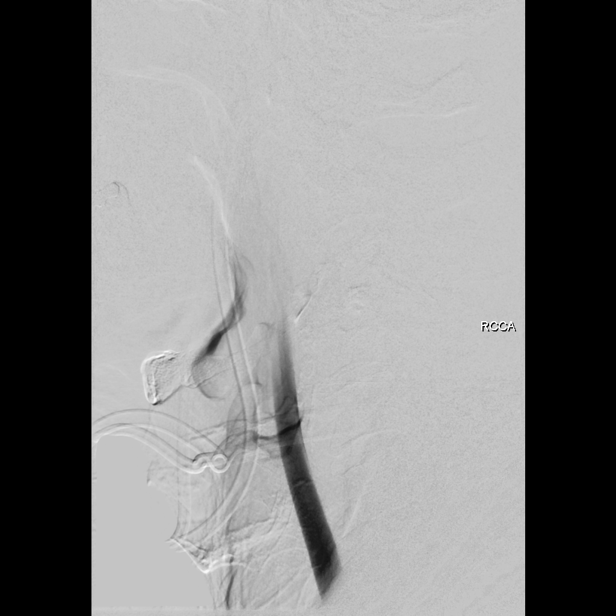
[im 111/232]
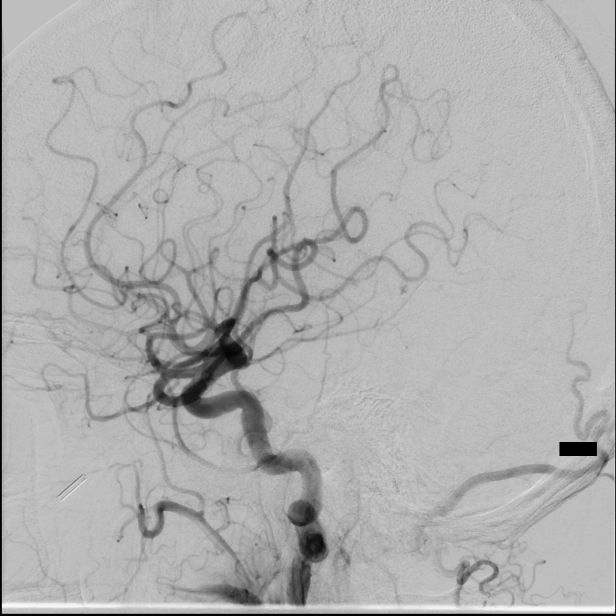
[im 131/232]
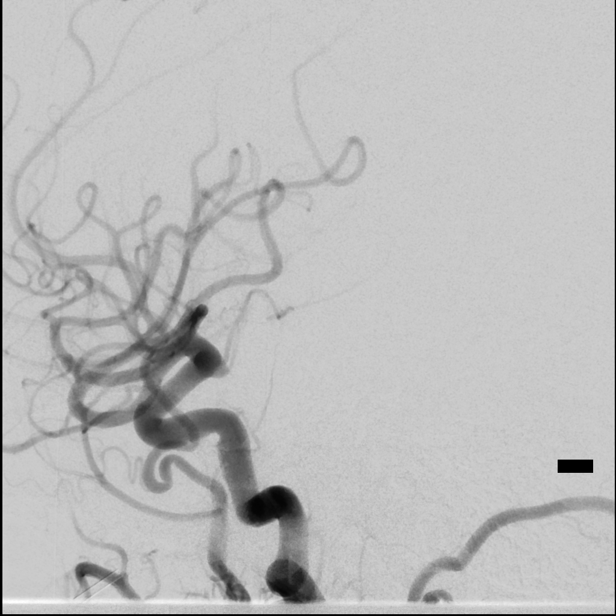
[im 151/232]
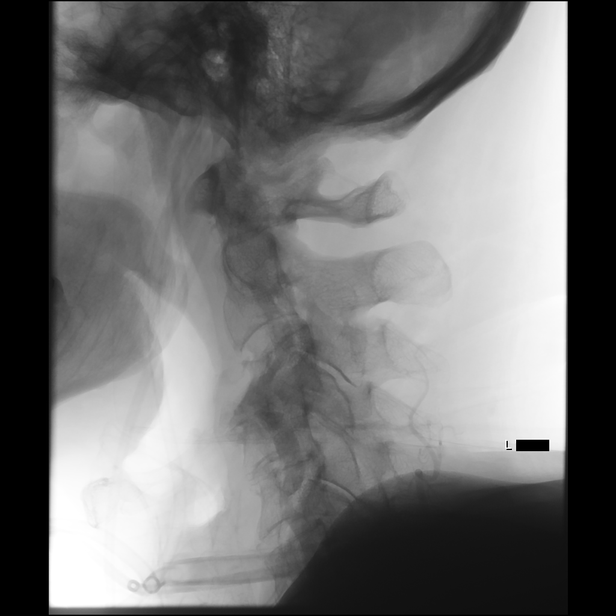
[im 171/232]
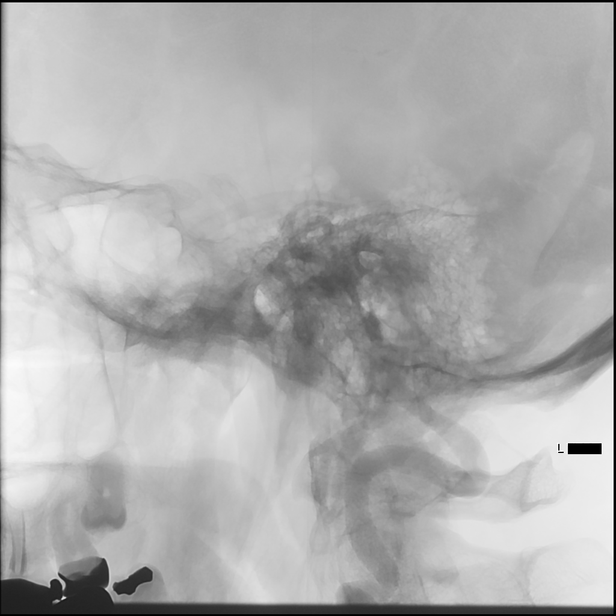
[im 191/232]
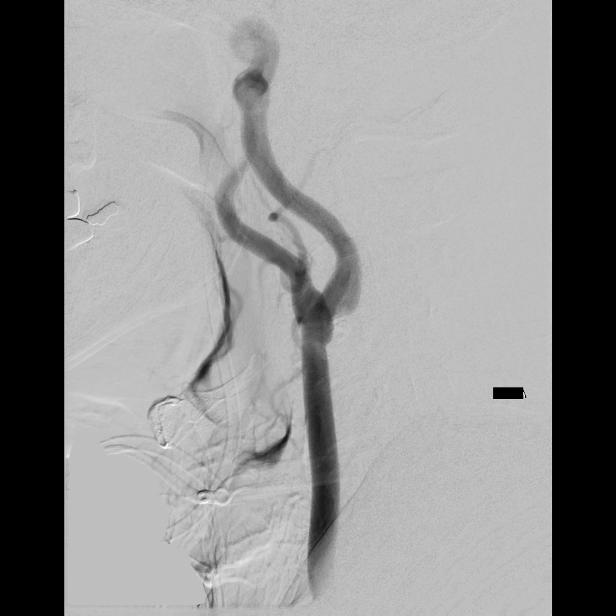
[im 211/232]
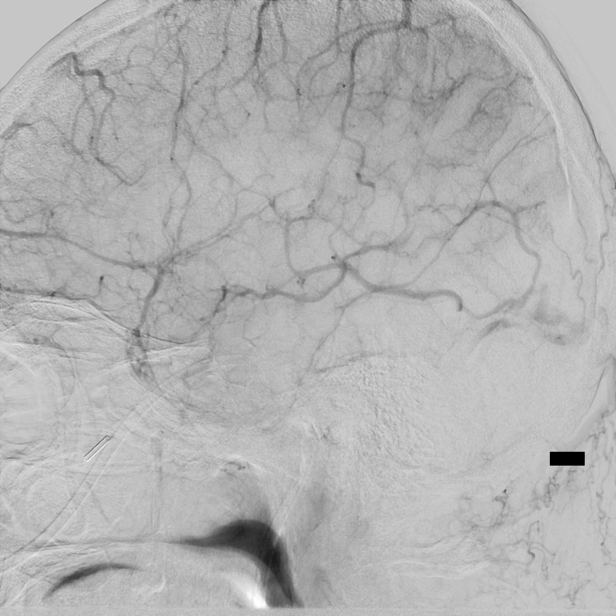
[im 232/232]
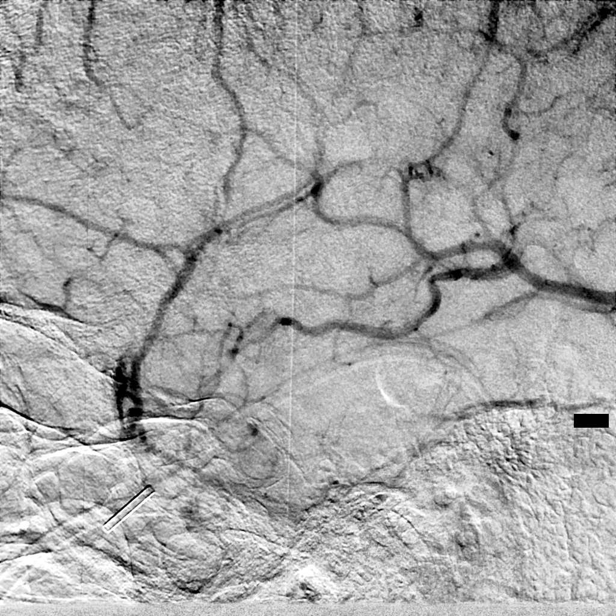

[12 of 24 positions shown; findings below may reference images not displayed]

MEDICATIONS:
Heparin 4000 units IA; none antibiotic was administered within 1
hour of the procedure.

ANESTHESIA/SEDATION:
Versed 1 mg IV; Fentanyl 25 mcg IV

Moderate Sedation Time:  37 minutes

The patient was continuously monitored during the procedure by the
interventional radiology nurse under my direct supervision.

CONTRAST:  Isovue 300 approximately 60 mL.

FLUOROSCOPY TIME:  Fluoroscopy Time: 10 minutes 36 seconds (4827
mGy).

COMPLICATIONS:
None immediate.
The right forearm to the wrist was prepped and draped in the usual
sterile fashion. The right radial artery was then palpated. A dorsal
palmar anastomosis was then verified. Using ultrasound, the
morphology of the right radial artery was then documented. Using
ultrasound guidance trans radial access into the right radial artery
was obtained with a micropuncture set followed by placement of a [DATE]
French radial sheath over a 0.018 inch micro guidewire. The
obturator, and the micro guidewire were removed. Good aspiration was
obtained from the side port of the sheath. A cocktail of 4000 units
of heparin, 200 mcg of nitroglycerin, and 2.5 mg of Versed were then
injected through the sheath in a diluted form without event.

A right radial artery angiogram was obtained.

Over a 0.035 inch Roadrunner guidewire, a 5 Laaraj [place] 2
diagnostic catheter was then advanced to the aortic arch region, and
select cannulation was performed the right vertebral artery, the
right common carotid artery, the left common carotid artery, and the
left vertebral artery. Following the procedure, a right radial wrist
band was applied for hemostasis. Distal radial pulse was then
verified to be present.
FINDINGS: The origin of the right vertebral artery is widely patent. The
vessel opacifies to the cranial skull base with mild tortuosity in
its mid segment.

The right vertebrobasilar junction and the right posterior-inferior
cerebellar artery are also widely patent.

The basilar artery, the posterior cerebral arteries, the superior
cerebellar arteries and the anterior-inferior cerebellar arteries
opacify into the capillary and venous phases.

Attenuated caliber of the left sigmoid sinus distally is noted.

The right common carotid arteriogram demonstrates the right external
carotid artery and its major branches to be widely patent.

The right internal carotid artery at the bulb has a smooth shallow
plaque along the posterior wall without evidence of ulcerations or
of significant stenosis.

More distally, the vessel is seen to opacify to the cranial skull
base with moderate tortuosity in the mid cervical and the distal
cervical left ICA comprising of 3 U turns.

The petrous, cavernous and the supraclinoid segments are widely
patent.

The right middle cerebral artery and the right anterior cerebral
artery opacify into the capillary and venous phases.

The origin of the left vertebral artery is widely patent.

The vessel is seen to ascend to the cranial skull base. Of note,
there is a tight focal stenosis of the left vertebral artery at the
level of C4-5, and also at the level of the upper aspect of C3.
These appear to be related to extrinsic compressions secondary to
osteophytes.

More distally, the left vertebrobasilar junction is widely patent.

The left posterior-inferior cerebellar artery, the basilar artery,
the posterior cerebral arteries, the superior cerebral arteries and
the anterior-inferior cerebellar arteries opacify into the capillary
and venous phases. Unopacified blood is seen in the basilar artery
from the contralateral vertebral artery.

The left common carotid arteriogram demonstrates the left external
carotid artery and its major branches to be widely patent.

The left internal carotid artery at the bulb has a smooth shallow
plaque along the posterior wall associated with approximately 10%
stenosis by the NASCET criteria. Mild narrowing of the left common
carotid artery proximal to the bifurcation is also seen. Mild to
moderate tortuosity of the left internal carotid artery mid cervical
segment is again noted without evidence of kinking. The petrous,
cavernous and the supraclinoid segments are widely patent.

A focal outpouching is seen in the left posterior communicating
artery region. This may represent a small aneurysm versus
infundibulum.

The left middle cerebral artery and the left anterior cerebral
artery opacify into the capillary and venous phases. Cross-filling
via the anterior communicating artery of the right anterior cerebral
A2 segment and distally is noted.

Again noted is attenuated caliber of the left sigmoid sinus.
IMPRESSION: Two areas of significant narrowing of the proximal left vertebral
artery at the level of C4-5, and at the upper aspect of C3 most
likely due to extrinsic compression.

Moderate to moderately severe tortuosity of the internal carotid
arteries in the mid and distal cervical segments bilaterally may be
secondary to sustained chronic hypertension.

PLAN:
Patient to follow-up with referring [facility].

## 2020-11-16 NOTE — Patient Instructions (Addendum)
Medication Instructions:  Your physician has recommended you make the following change in your medication:   ** Hold Toprol x 1 month and if no improvement then restart Toprol and hold Flecainide x 1 month.  Let us know how you are doing.  *If you need a refill on your cardiac medications before your next appointment, please call your pharmacy*   Lab Work: None ordered.  If you have labs (blood work) drawn today and your tests are completely normal, you will receive your results only by: [place] (if you have MyChart) OR A paper copy in the mail If you have any lab test that is abnormal or we need to change your treatment, we will call you to review the results.   Testing/Procedures: None ordered.    Follow-Up: At [facility], you and your health needs are our priority.  As part of our continuing mission to provide you with exceptional heart care, we have created designated Provider Care Teams.  These Care Teams include your primary Cardiologist (physician) and Advanced Practice Providers (APPs -  Physician Assistants and Nurse Practitioners) who all work together to provide you with the care you need, when you need it.  We recommend signing up for the patient portal called "MyChart".  Sign up information is provided on this After Visit Summary.  MyChart is used to connect with patients for Virtual Visits (Telemedicine).  Patients are able to view lab/test results, encounter notes, upcoming appointments, etc.  Non-urgent messages can be sent to your provider as well.   To learn more about what you can do with MyChart, go to [email or web address].    Your next appointment:   6 month(s)  The format for your next appointment:   In Person  Provider:   Virl Axe, MD

## 2020-11-16 NOTE — Progress Notes (Signed)
Patient Care Team: [name], MD as PCP - General (Family Medicine) Suella Broad, MD as Consulting Physician (Physical Medicine and Rehabilitation)   HPI  Billy Carpenter is a 75 y.o. male seen in follow-up for recurrent syncope and nonsustained ventricular tachycardia. He is s/p linq implant Atrial fibrillation treated with flecainide and anticoagulated with Xarelto.  Has a history of a stroke and a remote carotid artery dissection.   Thromboembolic risk factors ( age -45, HTN-1, TIA/CVA-2, Vasc disease -1, CHF-1) for a CHADSVASc Score of >=7   He is also been seen by neurology.  Not withstanding negative EEG he was put on Keppra because of the concern that a seizure following his stroke could have been responsible for his syncope.  Subsequently stopped  Today, the patient denies chest pain, shortness of breath, nocturnal dyspnea, orthopnea or peripheral edema.  There have been no palpitations or syncope.  Complains of some lightheadedness which he mitigates by increased fluid intake.  He has been able to handle this on the golf [place].  He had actually shot a part of this year. He struggles with sleep disturbance.  He awakens needing to urinate, but he also has appreciated that he struggles with vivid dreams.  He described them as "hallucinations "to Dr. Tobey Grim and no other documented dreams.  And he takes a multitude of medications which have been associated with this.  He has already worked on decreasing his p.o. fluid intake at the end of the day  Date Cr K Hgb  12/20 1.07 3.9 16.6  1/22  0.97 4.1 15.9   DATE TEST EF    10/15 Echo  60-65%    9//18 Echo   30-35 % Mild RV dysfn  1/19 LHC  50 % Normal CA  1/19 Echo  45-50%    12/20 Echo  55-60%    DATE PR interval QRSduration Dose  6/21 210 10 75  6/22  180 84 50   Past Medical History:  Diagnosis Date   BPH (benign prostatic hyperplasia)    Carotid artery disease ([place])    Left internal carotid artery  spontaneous dissection in the past  //   Doppler, October, 2013, minimal plaque, 0-39% bilateral   Chronic low back pain 05/03/2017   CVA (cerebral infarction)    left insular cortex stroke with left internal carotid artery dissection and near occlusion   Dyslipidemia    Hematuria    Hypertension    Mitral regurgitation    mild, echo, March, 2010   Persistent atrial fibrillation ([facility])    Syncope    Syncope and collapse 12/21/2017    Past Surgical History:  Procedure Laterality Date   BLADDER STONE REMOVAL     IR ANGIO INTRA EXTRACRAN SEL COM CAROTID INNOMINATE BILAT MOD SED  05/16/2019   IR ANGIO VERTEBRAL SEL VERTEBRAL BILAT MOD SED  05/16/2019   IR US GUIDE VASC ACCESS RIGHT  05/16/2019   LEFT HEART CATH AND CORONARY ANGIOGRAPHY N/A 06/27/2017   Procedure: LEFT HEART CATH AND CORONARY ANGIOGRAPHY;  Surgeon: Belva Crome, MD;  Location: [facility];  Service: Cardiovascular;  Laterality: N/A;   PROSTATE SURGERY  2009   WRIST SURGERY     Right    Current Meds  Medication Sig   atorvastatin (LIPITOR) 20 MG tablet Take 1 tablet (20 mg total) by mouth daily.   flecainide (TAMBOCOR) 50 MG tablet Take 1 tablet (50 mg total) by mouth 2 (two)  times daily.   metoprolol succinate (TOPROL-XL) 25 MG 24 hr tablet TAKE 1 TABLET BY MOUTH EVERY DAY   rivaroxaban (XARELTO) 20 MG TABS tablet TAKE 1 TABLET BY MOUTH EVERY DAY WITH SUPPER   No Known Allergies  Physical Exam: BP 100/64   Pulse 74   Ht 5' 7.5" (1.715 m)   Wt 148 lb (67.1 kg)   SpO2 97%   BMI 22.84 kg/m  Well developed and well nourished in no acute distress HENT normal Neck supple with JVP-flat Lungs nClear Device pocket well healed; without hematoma or erythema.  There is no tethering  Regular rate and rhythm, no  gallop No  murmur Abd-soft with active BS No Clubbing cyanosis  edema Skin-warm and dry A & Oriented  Grossly normal sensory and motor function  ECG sinus at 74 Interval of 18/08/40  Assessment  and  Plan:  Syncope abrupt onset/offset-recurrent   Orthostatic lightheadedness-improved   Atrial fibrillation-paroxysmal   Prior stroke   Cardiomyopathy-  nonischemic-mild   ventricular tachycardia-nonsustained  No interval syncope.  Awaiting DMV report.   Recurrent atrial arrhythmias.  For now we will continue him on his flecainide 50 twice daily and metoprolol 25 daily today monitor dose limited by orthostatic lightheadedness.  His sleep disturbance can be associated with the beta-blockers and flecainide.  Hence, we will take an exclusion trial as this has been a very disruptive part of his life.  We will first hold his beta-blocker.  We have reviewed the concerns about having flecainide on board without a beta-blocker.  The rhythm issues have been infrequent.  If after the first month there is no improvement, he will resume his metoprolol and hence hold his flecainide.  There is a low incidence of insomnia associated with rivaroxaban; this can be also trialed.  Recurrent ventricular tachycardia-nonsustained asymptomatic  On anticoagulation with rivaroxaban without bleeding. Some  I,Stephanie Williams,acting as a scribe for Virl Axe, MD.,have documented all relevant documentation on the behalf of Virl Axe, MD,as directed by  Virl Axe, MD while in the presence of Virl Axe, MD.  I, Virl Axe, MD, have reviewed all documentation for this visit. The documentation on 11/16/20 for the exam, diagnosis, procedures, and orders are all accurate and complete.

## 2020-11-23 NOTE — Progress Notes (Signed)
[facility] Summary Report / Loop Recorder 

## 2020-12-06 ENCOUNTER — Ambulatory Visit (INDEPENDENT_AMBULATORY_CARE_PROVIDER_SITE_OTHER): Payer: Medicare Other

## 2020-12-06 DIAGNOSIS — R55 Syncope and collapse: Secondary | ICD-10-CM | POA: Diagnosis not present

## 2020-12-07 LAB — CUP PACEART REMOTE DEVICE CHECK
Date Time Interrogation Session: 20220710000433
Implantable Pulse Generator Implant Date: 20201211

## 2020-12-12 ENCOUNTER — Other Ambulatory Visit: Payer: Self-pay

## 2020-12-12 ENCOUNTER — Other Ambulatory Visit: Payer: Self-pay | Admitting: Internal Medicine

## 2020-12-13 MED ORDER — RIVAROXABAN 20 MG PO TABS: ORAL_TABLET | ORAL | 1 refills | Status: DC

## 2020-12-13 NOTE — Telephone Encounter (Signed)
Pt's age 75, wt 67.1 kg, SCr 0.97, CrCl 61.49, last ov w/ SK 11/16/20.

## 2020-12-13 NOTE — Telephone Encounter (Signed)
Pt's age 75, wt 67.7 kg, SCr 0.97, CrCl 61.49, last ov w/ SK 11/16/20.

## 2020-12-15 ENCOUNTER — Telehealth: Payer: Self-pay | Admitting: Internal Medicine

## 2020-12-15 DIAGNOSIS — Z0279 Encounter for issue of other medical certificate: Secondary | ICD-10-CM

## 2020-12-15 NOTE — Telephone Encounter (Signed)
HIM received a DOT form from the nurse. HIM called and left the patient a message informing him of the form process, $29 fee, and authorization we will need in order to process/complete the form.  AO 12/15/20

## 2020-12-15 NOTE — Telephone Encounter (Signed)
Patient came into the office to make $29 payment and sign 2 authorizations to begin the process of his DOT form. HIM has given the form to the nurse to be completed by Dr. Caryl Comes. AO 12/15/20

## 2020-12-17 ENCOUNTER — Telehealth: Payer: Self-pay | Admitting: Internal Medicine

## 2020-12-17 NOTE — Telephone Encounter (Signed)
Patient called to talk with Dr. Olin Pia nurse due to a question that she had about the patient's [place] DMV paperwork. Please call back

## 2020-12-17 NOTE — Telephone Encounter (Signed)
Patient returning call.

## 2020-12-17 NOTE — Telephone Encounter (Signed)
Spoke with pt and advised DMV form completed and will fax to [facility] for pt.  Pt requests form be faxed to him as well.  Pt thanked Therapist, sports for the call.  DMV form faxed to pt as well.

## 2020-12-17 NOTE — Telephone Encounter (Signed)
Attempted phone call to pt.  Left voicemail message to contact RN at [phone]. 

## 2020-12-20 DIAGNOSIS — E785 Hyperlipidemia, unspecified: Secondary | ICD-10-CM | POA: Diagnosis not present

## 2020-12-22 DIAGNOSIS — Z0001 Encounter for general adult medical examination with abnormal findings: Secondary | ICD-10-CM | POA: Diagnosis not present

## 2020-12-22 DIAGNOSIS — E559 Vitamin D deficiency, unspecified: Secondary | ICD-10-CM | POA: Diagnosis not present

## 2020-12-22 DIAGNOSIS — N320 Bladder-neck obstruction: Secondary | ICD-10-CM | POA: Diagnosis not present

## 2020-12-22 DIAGNOSIS — E785 Hyperlipidemia, unspecified: Secondary | ICD-10-CM | POA: Diagnosis not present

## 2020-12-22 DIAGNOSIS — I4891 Unspecified atrial fibrillation: Secondary | ICD-10-CM | POA: Diagnosis not present

## 2020-12-22 DIAGNOSIS — Z125 Encounter for screening for malignant neoplasm of prostate: Secondary | ICD-10-CM | POA: Diagnosis not present

## 2020-12-22 DIAGNOSIS — Z7901 Long term (current) use of anticoagulants: Secondary | ICD-10-CM | POA: Diagnosis not present

## 2020-12-22 DIAGNOSIS — I10 Essential (primary) hypertension: Secondary | ICD-10-CM | POA: Diagnosis not present

## 2020-12-22 DIAGNOSIS — R7303 Prediabetes: Secondary | ICD-10-CM | POA: Diagnosis not present

## 2020-12-29 NOTE — Progress Notes (Signed)
[facility] Summary Report / Loop Recorder 

## 2020-12-31 MED ORDER — DILTIAZEM HCL ER COATED BEADS 120 MG PO CP24: 120.0000 mg | ORAL_CAPSULE | Freq: Every day | ORAL | 3 refills | Status: DC

## 2020-12-31 NOTE — Telephone Encounter (Signed)
Per Dr Caryl Comes pt should resume Diltiazem '120mg'$  - 1 tablet by mouth daily.  Rx sent to pharmacy on file in [place].

## 2021-01-06 NOTE — Telephone Encounter (Addendum)
HIM was unable to complete a release for the form due to the original form not being returned to HIM after completion per process. The nurse has stated it has been faxed over to the DOT. The nurse has also stated that a copy of the form was faxed to the patient as noted on the authorization/paperwork. HIM has called and left a message to inform the patient of the update.  AO 01/06/21

## 2021-01-10 ENCOUNTER — Ambulatory Visit (INDEPENDENT_AMBULATORY_CARE_PROVIDER_SITE_OTHER): Payer: Medicare Other

## 2021-01-10 DIAGNOSIS — R55 Syncope and collapse: Secondary | ICD-10-CM | POA: Diagnosis not present

## 2021-01-12 LAB — CUP PACEART REMOTE DEVICE CHECK
Date Time Interrogation Session: 20220815075640
Implantable Pulse Generator Implant Date: 20201211

## 2021-01-18 DIAGNOSIS — H2513 Age-related nuclear cataract, bilateral: Secondary | ICD-10-CM | POA: Diagnosis not present

## 2021-01-18 DIAGNOSIS — H25013 Cortical age-related cataract, bilateral: Secondary | ICD-10-CM | POA: Diagnosis not present

## 2021-01-29 NOTE — Progress Notes (Signed)
[facility] Summary Report / Loop Recorder 

## 2021-02-14 ENCOUNTER — Ambulatory Visit (INDEPENDENT_AMBULATORY_CARE_PROVIDER_SITE_OTHER): Payer: Medicare Other

## 2021-02-14 DIAGNOSIS — R55 Syncope and collapse: Secondary | ICD-10-CM | POA: Diagnosis not present

## 2021-02-14 LAB — CUP PACEART REMOTE DEVICE CHECK
Date Time Interrogation Session: 20220919000206
Implantable Pulse Generator Implant Date: 20201211

## 2021-02-21 NOTE — Progress Notes (Signed)
[facility] Summary Report / Loop Recorder 

## 2021-02-24 ENCOUNTER — Telehealth: Payer: Self-pay

## 2021-02-24 NOTE — Telephone Encounter (Signed)
ILR summary report received. Battery status OK. Normal device function. No new symptom, brady, or pause episodes.  7 tachy episodes @ 160's-170's bpm. Longest 3 min. PVC burden 5.6%. 9 new AF episodes. Burden 0.8%. +OAC. Monthly summary reports and ROV/PRN.  Patient reports yesterday during this time he was golfing and felt palpitations. Denies chest pain or shortness of breath. Resolved after playing and since has had no issues. Reports compliance with Cardizem 120 mg daily, xarelto 20 mg daily, flecainide 50 mg BID. Reports he stopped taking Toprol-XL x1 month ago d/t vivid dreams and Dr. Caryl Comes is aware.   Advised patient to call if symptoms occur again. I will forward to Dr. Caryl Comes for review and will call with any changes.

## 2021-03-04 DIAGNOSIS — I4891 Unspecified atrial fibrillation: Secondary | ICD-10-CM | POA: Diagnosis not present

## 2021-03-04 DIAGNOSIS — Z23 Encounter for immunization: Secondary | ICD-10-CM | POA: Diagnosis not present

## 2021-03-04 DIAGNOSIS — I639 Cerebral infarction, unspecified: Secondary | ICD-10-CM | POA: Diagnosis not present

## 2021-03-04 MED ORDER — METOPROLOL TARTRATE 25 MG PO TABS: 25.0000 mg | ORAL_TABLET | Freq: Every evening | ORAL | 0 refills | Status: DC | PRN

## 2021-03-04 NOTE — Telephone Encounter (Signed)
  Staff message received from Dr. Caryl Comes below.   Deboraha Sprang, MD  Thora Lance, RN; Simone Curia, RN Caller: Unspecified (1 week ago) Please look like he has atrial fibrillation with a rapid rate.  I would ask him whether his dreams got better off of metoprolol, but I would also give him a prescription for metoprolol tartrate 25 to take 1 nightly for for total of 3 doses if he has recurrent rapid tachypalpitations  Thanks SK   Attempted to contact patient to follow up. No answer, LMTCB. Direct phone number provided.

## 2021-03-04 NOTE — Telephone Encounter (Signed)
Successful telephone encounter to patient to discuss Dr. Olin Pia recommendations for palpitation control. Patient states his vivid dreams/nightmares are much improved off of the metoprolol however the palpitations are bothersome and he is having them daily. They are interfering with his quality of life, specifically his ability to play golf comfortably. He is instructed to take 25mg  metoprolol tartrate nightly x 3 nights when palpitations occur. Patient admits to palpitations today and will begin medication tonight. Prescription sent to patient pharmacy on file as confirmed. Patient would like to resume Toprol XL nightly as he feels Cardizem is not working as well. Will route to Dr. Caryl Comes for approval.

## 2021-03-07 MED ORDER — METOPROLOL TARTRATE 25 MG PO TABS: ORAL_TABLET | ORAL | 3 refills | Status: DC

## 2021-03-07 NOTE — Telephone Encounter (Addendum)
Unsuccessful telephone encounter to patient to update recommendations and prescription for metoprolol for AF/palpitations. [employer] compliant VM message left requesting call back to [phone].  "My order was not clear sorry he can take metoprolol 25 mg up to  q4h x 3 doses on a given night for palpitations  ( ie 25-75 mg for bad palps )  Thanks SK"

## 2021-03-07 NOTE — Addendum Note (Signed)
Addended by: Trish Fountain E on: 03/07/2021 10:14 AM   Modules accepted: Orders

## 2021-03-07 NOTE — Telephone Encounter (Signed)
Patient returning call. Discussed new metoprolol prescription with patient. Patient states his palpitations are much improved with 25 mg metoprolol nightly x 3. He was able to play golf this am comfortably without palpitations. Patient appreciative of call and will continue to document symptoms as they occur. He will notify device clinic if new or worsening symptoms occur or medications becomes ineffective.

## 2021-03-21 ENCOUNTER — Ambulatory Visit (INDEPENDENT_AMBULATORY_CARE_PROVIDER_SITE_OTHER): Payer: Medicare Other

## 2021-03-21 DIAGNOSIS — R55 Syncope and collapse: Secondary | ICD-10-CM | POA: Diagnosis not present

## 2021-03-23 LAB — CUP PACEART REMOTE DEVICE CHECK
Date Time Interrogation Session: 20221025101353
Implantable Pulse Generator Implant Date: 20201211

## 2021-03-26 ENCOUNTER — Other Ambulatory Visit: Payer: Self-pay | Admitting: Internal Medicine

## 2021-03-29 NOTE — Progress Notes (Signed)
[facility] Summary Report / Loop Recorder 

## 2021-04-18 ENCOUNTER — Encounter: Payer: Self-pay | Admitting: Internal Medicine

## 2021-04-18 DIAGNOSIS — M25512 Pain in left shoulder: Secondary | ICD-10-CM | POA: Diagnosis not present

## 2021-04-25 ENCOUNTER — Ambulatory Visit (INDEPENDENT_AMBULATORY_CARE_PROVIDER_SITE_OTHER): Payer: Medicare Other

## 2021-04-25 DIAGNOSIS — R55 Syncope and collapse: Secondary | ICD-10-CM

## 2021-04-26 LAB — CUP PACEART REMOTE DEVICE CHECK
Date Time Interrogation Session: 20221129095822
Implantable Pulse Generator Implant Date: 20201211

## 2021-04-28 ENCOUNTER — Encounter: Payer: Self-pay | Admitting: Internal Medicine

## 2021-05-03 NOTE — Progress Notes (Signed)
[facility] Summary Report / Loop Recorder 

## 2021-05-17 DIAGNOSIS — M542 Cervicalgia: Secondary | ICD-10-CM | POA: Diagnosis not present

## 2021-05-24 ENCOUNTER — Ambulatory Visit (INDEPENDENT_AMBULATORY_CARE_PROVIDER_SITE_OTHER): Payer: Medicare Other

## 2021-05-24 DIAGNOSIS — R55 Syncope and collapse: Secondary | ICD-10-CM

## 2021-05-24 DIAGNOSIS — M542 Cervicalgia: Secondary | ICD-10-CM | POA: Diagnosis not present

## 2021-05-24 DIAGNOSIS — M519 Unspecified thoracic, thoracolumbar and lumbosacral intervertebral disc disorder: Secondary | ICD-10-CM | POA: Diagnosis not present

## 2021-05-24 LAB — CUP PACEART REMOTE DEVICE CHECK
Date Time Interrogation Session: 20221224230626
Implantable Pulse Generator Implant Date: 20201211

## 2021-06-02 ENCOUNTER — Encounter: Payer: Self-pay | Admitting: Internal Medicine

## 2021-06-02 ENCOUNTER — Ambulatory Visit (INDEPENDENT_AMBULATORY_CARE_PROVIDER_SITE_OTHER): Payer: Medicare Other | Admitting: Internal Medicine

## 2021-06-02 ENCOUNTER — Other Ambulatory Visit: Payer: Self-pay

## 2021-06-02 VITALS — BP 116/78 | HR 58 | Ht 67.5 in | Wt 146.2 lb

## 2021-06-02 DIAGNOSIS — Z95818 Presence of other cardiac implants and grafts: Secondary | ICD-10-CM

## 2021-06-02 DIAGNOSIS — I480 Paroxysmal atrial fibrillation: Secondary | ICD-10-CM | POA: Diagnosis not present

## 2021-06-02 DIAGNOSIS — I4729 Other ventricular tachycardia: Secondary | ICD-10-CM

## 2021-06-02 DIAGNOSIS — I5020 Unspecified systolic (congestive) heart failure: Secondary | ICD-10-CM | POA: Diagnosis not present

## 2021-06-02 NOTE — Patient Instructions (Signed)
Medication Instructions:  Your physician recommends that you continue on your current medications as directed. Please refer to the Current Medication list given to you today.  Change the time you take your Cardizem to the evening.  *If you need a refill on your cardiac medications before your next appointment, please call your pharmacy*   Lab Work: None ordered.  If you have labs (blood work) drawn today and your tests are completely normal, you will receive your results only by: [place] (if you have MyChart) OR A paper copy in the mail If you have any lab test that is abnormal or we need to change your treatment, we will call you to review the results.   Testing/Procedures: None    Follow-Up: At [facility], you and your health needs are our priority.  As part of our continuing mission to provide you with exceptional heart care, we have created designated Provider Care Teams.  These Care Teams include your primary Cardiologist (physician) and Advanced Practice Providers (APPs -  Physician Assistants and Nurse Practitioners) who all work together to provide you with the care you need, when you need it.  We recommend signing up for the patient portal called "MyChart".  Sign up information is provided on this After Visit Summary.  MyChart is used to connect with patients for Virtual Visits (Telemedicine).  Patients are able to view lab/test results, encounter notes, upcoming appointments, etc.  Non-urgent messages can be sent to your provider as well.   To learn more about what you can do with MyChart, go to [email or web address].    Your next appointment:   12 months with Dr Caryl Comes

## 2021-06-02 NOTE — Progress Notes (Signed)
Patient Care Team: [name], MD as PCP - General (Family Medicine) Suella Broad, MD as Consulting Physician (Physical Medicine and Rehabilitation)   HPI  Billy Carpenter is a 76 y.o. male seen in  follow-up for recurrent syncope and nonsustained ventricular tachycardia. He is s/p linq implant. Atrial fibrillation treated with flecainide and anticoagulated with Xarelto.     Has a history of a stroke and a remote carotid artery dissection.   Thromboembolic risk factors ( age -36, HTN-1, TIA/CVA-2, Vasc disease -1, CHF-1) for a CHADSVASc Score of >=7   He is also been seen by neurology.  Not withstanding negative EEG he was put on Keppra because of the concern that a seizure following his stroke could have been responsible for his syncope.  Subsequently stopped   Today,He says that he has been well, he has had moderate Afib symptoms. Normally he does not notice them. Palpitations are the major sign   At one point he was 160lbs but now he is 140lbs.   The patient denies chest pain, shortness of breath, nocturnal dyspnea, orthopnea or peripheral edema.  There have been no palpitations or syncope.  Complains of lightheadedness with standing. .   Date Cr K Hgb  12/20 1.07 3.9 16.6  1/22  0.97 4.1 15.9  /           DATE TEST EF    10/15 Echo  60-65%    9//18 Echo   30-35 % Mild RV dysfn  1/19 LHC  50 % Normal CA  1/19 Echo  45-50%    12/20 Echo  55-60%         DATE PR interval QRSduration Dose  6/21 210 33 75  6/22  180 84 50         Past Medical History:  Diagnosis Date   BPH (benign prostatic hyperplasia)    Carotid artery disease ([place])    Left internal carotid artery spontaneous dissection in the past  //   Doppler, October, 2013, minimal plaque, 0-39% bilateral   Chronic low back pain 05/03/2017   CVA (cerebral infarction)    left insular cortex stroke with left internal carotid artery dissection and near occlusion   Dyslipidemia    Hematuria     Hypertension    Mitral regurgitation    mild, echo, March, 2010   Persistent atrial fibrillation (H[facility])    Syncope    Syncope and collapse 12/21/2017    Past Surgical History:  Procedure Laterality Date   BLADDER STONE REMOVAL     IR ANGIO INTRA EXTRACRAN SEL COM CAROTID INNOMINATE BILAT MOD SED  05/16/2019   IR ANGIO VERTEBRAL SEL VERTEBRAL BILAT MOD SED  05/16/2019   IR US GUIDE VASC ACCESS RIGHT  05/16/2019   LEFT HEART CATH AND CORONARY ANGIOGRAPHY N/A 06/27/2017   Procedure: LEFT HEART CATH AND CORONARY ANGIOGRAPHY;  Surgeon: Belva Crome, MD;  Location: [facility];  Service: Cardiovascular;  Laterality: N/A;   PROSTATE SURGERY  2009   WRIST SURGERY     Right    Current Meds  Medication Sig   atorvastatin (LIPITOR) 20 MG tablet Take 1 tablet (20 mg total) by mouth daily.   diltiazem (CARDIZEM CD) 120 MG 24 hr capsule Take 1 capsule (120 mg total) by mouth daily.   flecainide (TAMBOCOR) 50 MG tablet TAKE 1 TABLET BY MOUTH TWICE A DAY   metoprolol tartrate (LOPRESSOR) 25 MG tablet Please take 25mg  q  4 hours x 3 doses nightly as needed for palpitations   rivaroxaban (XARELTO) 20 MG TABS tablet TAKE 1 TABLET BY MOUTH EVERY DAY WITH SUPPER   No Known Allergies  ROS: Please see the history of present illness. (+)  All other systems are reviewed and negative.    Physical Exam: BP 116/78    Pulse (!) 58    Ht 5' 7.5" (1.715 m)    Wt 146 lb 3.2 oz (66.3 kg)    SpO2 95%    BMI 22.56 kg/m  Well developed and well nourished in no acute distress HENT normal Neck supple with JVP-flat Lungs nClear Device pocket well healed; without hematoma or erythema.  There is no tethering  Irregularly irregular rate and rhythm with early systolic murmur Abd-soft with active BS No Clubbing cyanosis  edema Skin-warm and dry A & Oriented  Grossly normal sensory and motor function   06/02/2021 ECG: Sinus at 58 Interval 22/09/43 Isolated PAC  11/16/2020 ECG: sinus at 74 Interval of  18/08/40   Assessment and  Plan:  Syncope abrupt onset/offset-recurrent   Orthostatic lightheadedness-    Atrial fibrillation-paroxysmal   Prior stroke   Cardiomyopathy-  nonischemic-mild   ventricular tachycardia-nonsustained   No interval syncope\  Freq episodes of Afib, the longest of which 3h; no bleeding continue Rivaroxaban 20 mg  Orthostatic hypotension with some symptoms.  We will have him take his diltiazem at night; he needs AV nodal blockade for his episodes of atrial fibrillation.  It may be that he would benefit from adjunctive digoxin  His loop recorder is approaching 4 years.  F/U in 1 year   I,Tinashe Williams,acting as a scribe for Virl Axe, MD.,have documented all relevant documentation on the behalf of Virl Axe, MD,as directed by  Virl Axe, MD while in the presence of Virl Axe, MD.   I, Virl Axe, MD, have reviewed all documentation for this visit. The documentation on 06/02/21 for the exam, diagnosis, procedures, and orders are all accurate and complete.

## 2021-06-03 NOTE — Progress Notes (Signed)
[facility] Summary Report / Loop Recorder 

## 2021-06-04 ENCOUNTER — Other Ambulatory Visit: Payer: Self-pay | Admitting: Internal Medicine

## 2021-06-16 ENCOUNTER — Encounter: Payer: Self-pay | Admitting: Internal Medicine

## 2021-06-23 ENCOUNTER — Telehealth: Payer: Self-pay

## 2021-06-23 NOTE — Telephone Encounter (Signed)
Email sent to Lyla at [facility] to update protocol.

## 2021-06-23 NOTE — Telephone Encounter (Signed)
-----   Message from Deboraha Sprang, MD sent at 06/22/2021  5:23 PM EST ----- Regarding: RE: Implant Indication Please   ----- Message ----- From: Simone Curia, RN Sent: 06/15/2021   1:06 PM EST To: Deboraha Sprang, MD Subject: Implant Indication                             Hey Dr. Caryl Comes,  During our [facility] bi-weekly meetings, it was brought up today for this patient receiving frequent AF alerts.  The CV team wanted me to see if we could change the device management to the AF protocol to reduce AF alerts?    Thanks, Marliss Czar

## 2021-07-01 LAB — CUP PACEART REMOTE DEVICE CHECK
Date Time Interrogation Session: 20230202230739
Implantable Pulse Generator Implant Date: 20201211

## 2021-07-04 ENCOUNTER — Ambulatory Visit (INDEPENDENT_AMBULATORY_CARE_PROVIDER_SITE_OTHER): Payer: Medicare Other

## 2021-07-04 DIAGNOSIS — R55 Syncope and collapse: Secondary | ICD-10-CM

## 2021-07-07 NOTE — Progress Notes (Signed)
[facility] Summary Report / Loop Recorder 

## 2021-07-11 ENCOUNTER — Telehealth: Payer: Self-pay | Admitting: *Deleted

## 2021-07-11 DIAGNOSIS — M5412 Radiculopathy, cervical region: Secondary | ICD-10-CM | POA: Insufficient documentation

## 2021-07-11 NOTE — Telephone Encounter (Signed)
Will route to pharm re: anticoag. Last OV 05/2021.

## 2021-07-11 NOTE — Telephone Encounter (Signed)
Normal recommendation is for to skip two doses which would be 3 days   Thanks SK

## 2021-07-11 NOTE — Telephone Encounter (Signed)
° °  Pre-operative Risk Assessment    Patient Name: Billy Carpenter  DOB: 04/09/1946 MRN: [id number]      Request for Surgical Clearance    Procedure:   CERVICAL SNRB INJECTION  Date of Surgery:  Clearance 07/19/21                                 Surgeon:  NOT LISTED Surgeon's Group or Practice Name:  [facility] Phone number:  [phone] Fax number:  [phone]   Type of Clearance Requested:   - Medical  - Pharmacy:  Hold Rivaroxaban (Xarelto) x 3 DAYS PRIOR   Type of Anesthesia:  Local    Additional requests/questions:    Jiles Prows   07/11/2021, 3:18 PM

## 2021-07-11 NOTE — Telephone Encounter (Signed)
Patient with diagnosis of afib on Xarelto for anticoagulation.    Procedure: CERVICAL SNRB INJECTION Date of procedure: Clearance 07/19/21   CHA2DS2-VASc Score = 7   This indicates a 11.2% annual risk of stroke. The patient's score is based upon: CHF History: 1 HTN History: 1 Diabetes History: 0 Stroke History: 2 Vascular Disease History: 1 Age Score: 2 Gender Score: 0      CrCl 59.8 ml/min  ACC/AHA and Chest guidelines recommend against bridging for DOACs  I will defer to Dr. Caryl Comes since patient will need a 3 day hold and has a hx of stroke

## 2021-07-12 NOTE — Telephone Encounter (Signed)
° ° °  Patient Name: RAJVIR ERNSTER  DOB: 03-15-1946 MRN: [id number]  Primary Cardiologist: None  Chart reviewed as part of pre-operative protocol coverage. Patient was last seen in the office by Dr. Caryl Comes on 06/02/2021. Patient was contacted and stated that he is feeling well and not experiencing any fatigue, chest pain, or SOB. He is able to complete 4 METS of activity. Given past medical history and time since last visit, based on ACC/AHA guidelines, GANNON HEINZMAN would be at acceptable risk for the planned procedure without further cardiovascular testing.   Per pharmacy and MD request patient is advised to hold Xarelto for 2 doses for a total of three days.   I will route this recommendation to the requesting party via Epic fax function and remove from pre-op pool.  Please call with questions.  Mable Fill, Marissa Nestle, NP 07/12/2021, 9:40 AM

## 2021-08-02 ENCOUNTER — Telehealth: Payer: Self-pay | Admitting: Neurology

## 2021-08-02 NOTE — Telephone Encounter (Signed)
Pt called stating that he is wanting to discuss an MRI copy that Dr Maxie Better at [facility] has given him. He states that he was advised that a Neurologist should look over. Please advice.  ?

## 2021-08-03 NOTE — Telephone Encounter (Signed)
In review of his visit w/ [facility] (in Care Everywhere). The following was noted in Dr. Reather Littler dictation: ? ?ASSESSMENT: ?Assessment Note  ?MRI of the cervical spine demonstrates multilevel cervical spondylosis. Severe foraminal stenosis at C5-6 and C6-7 on the left. Retrolisthesis of C3-C4. Facet arthrosis at multiple levels. T1-T2. Loss of caliber flow in the left vertebral artery from C2-3 to C5-6.  ?History of CTA of the head and neck 8/20 with vertebral artery compression due to bony overgrowth.  ? ?Impression:  ?Axial cervical intermittent C6-C7 radicular pain secondary to severe cervical foraminal stenosis at C6-7 and C5-6 no focal neurologic deficit  ?No help from C6-7 selective nerve root block.  ? ?Cervical spondylosis  ? ?Vertebral artery compression chronic   ? ?Plan:  ?I had a discussion with the patient concerning the anatomy and biomechanics of the cervical spine. In particular how to avoid positional neural compression by reducing neck extension during activities such as driving, sleeping, sitting at the computer and other activities. In addition to avoid prolonged flexion of the neck that typically aggravates the paraspinous musculature. Regular neck exercises would be beneficial as well.  ? ?He will take a copy of the MRI report to his next follow-up with Dr. Jannifer Franklin his neurologist.  ? ?We discussed a second selective nerve root block C5-6 on the left.  ?I discussed proceeding with an selective nerve root block at the level of the pathology and nerve root irritation.  ?The Patient is in agreement.  ?The procedure is performed under x-ray guidance. This is performed by Dr. Nelva Bush in our facility or with a Radiologist in town.  ?This will be scheduled after authorization has been secured from your insurance company.  ?Hopefully the injection will be therapeutic however every situation is different. If the relief is only temporarily effective we discussed a repeat injection versus consideration of  a surgical procedure performed at that level.  ?It is important to maintain a pain journal to recall the effects of the injection even if limited. If complete relief of symptoms are obtained then follow-up is optional. If only temporary relief of your symptoms are obtained then I would recommend a follow-up consultation for further discussion.  ?Monitor your blood glucose levels and supplement accordingly following the injection if you are a diabetic. In addition monitor your blood pressure if you are hypertensive  ? ?I would recommend supervised physical therapy to improve range of motion as well as to improve strength and functional capacity. Modalities can also be utilized as needed for pain control.  ?Traction may be helpful  ? ?Patient is not interested in surgical intervention. If he has residual symptoms after his block he is to call have him sent to Dr. Herma Mering for pain management   ? ?Assessment and Plan ?1. Pain in cervical spine  ? physical therapist referral - Evaluate and treat __cervical spine___ 2-3xwk x 3-4 weeks/ traction   ?2. Degeneration of intervertebral disc  ? selective nerve root block (PROC) - SNRB C5 AND C6 L   ? ?

## 2021-08-03 NOTE — Telephone Encounter (Signed)
I had left a message for him earlier. Attempted to call him back again and the voicemail is now full.  ? ?When he calls back, please provide him with Ariana's response. He should continue the treatment plan Dr. Tonita Cong has outlined for him (Physical therapy and pain management with Dr. Nelva Bush, if needed). ?

## 2021-08-04 ENCOUNTER — Telehealth: Payer: Self-pay

## 2021-08-04 NOTE — Telephone Encounter (Signed)
? ?  Primary Cardiologist: None ? ?Chart reviewed as part of pre-operative protocol coverage. Given past medical history and time since last visit, based on ACC/AHA guidelines, CAELAN BRANDEN would be at acceptable risk for the planned procedure without further cardiovascular testing.  ? ?Per clinical pharmacist and Dr. Caryl Comes, patient may hold  Xarelto for 3 days. ? ?I will route this recommendation to the requesting party via Epic fax function and remove from pre-op pool. ? ?Please call with questions. ? ?Emmaline Life, NP-C ? ?  ?08/04/2021, 1:22 PM ?[facility] ?[address], [address] ?Office [phone] Fax [phone] ? ? ? ? ?

## 2021-08-04 NOTE — Telephone Encounter (Signed)
Pt just cleared for this procedure 3 weeks ago (07/11/21 note), not sure if this is a separate procedure already or if the first one was delayed. Ok to use same clearance rec from 2/13 note. ?

## 2021-08-04 NOTE — Telephone Encounter (Signed)
? ?  Pre-operative Risk Assessment  ?  ?Patient Name: Billy Carpenter  ?DOB: 08-08-1945 ?MRN: [id number]  ? ?  ? ?Request for Surgical Clearance   ? ?Procedure:   Cervical SNRB Injection ? ?Date of Surgery:  Clearance 08/11/21                              ?   ?Surgeon:  Dr. Warnell Forester ?Surgeon's Group or Practice Name:  [facility] ?Phone number:  [phone] ?Fax number:  [phone] ?  ?Type of Clearance Requested:   ?- Pharmacy:  Hold xarelto  3 days ?  ?Type of Anesthesia:  None  ?  ?Additional requests/questions:   ? ?Signed, ?Kathreen Devoid   ?08/04/2021, 11:18 AM  ? ?

## 2021-08-04 NOTE — Telephone Encounter (Signed)
Patient Name: Billy Carpenter  ?DOB: Feb 24, 1946 ?MRN: [id number]  ? ?History of atrial fibrillation on chronic anticoagulation ? ?Request to hold for upcoming cervical SNRB injection  ?Clearance 08/11/21 ?

## 2021-08-07 ENCOUNTER — Encounter: Payer: Self-pay | Admitting: Internal Medicine

## 2021-08-08 ENCOUNTER — Ambulatory Visit (INDEPENDENT_AMBULATORY_CARE_PROVIDER_SITE_OTHER): Payer: Medicare Other

## 2021-08-08 DIAGNOSIS — R55 Syncope and collapse: Secondary | ICD-10-CM | POA: Diagnosis not present

## 2021-08-09 LAB — CUP PACEART REMOTE DEVICE CHECK
Date Time Interrogation Session: 20230314125937
Implantable Pulse Generator Implant Date: 20201211

## 2021-08-16 ENCOUNTER — Ambulatory Visit: Payer: Medicare Other | Admitting: Neurology

## 2021-08-16 ENCOUNTER — Encounter: Payer: Self-pay | Admitting: Neurology

## 2021-08-16 VITALS — BP 127/83 | HR 76 | Ht 67.5 in | Wt 147.0 lb

## 2021-08-16 DIAGNOSIS — L814 Other melanin hyperpigmentation: Secondary | ICD-10-CM | POA: Insufficient documentation

## 2021-08-16 DIAGNOSIS — Z85828 Personal history of other malignant neoplasm of skin: Secondary | ICD-10-CM | POA: Insufficient documentation

## 2021-08-16 DIAGNOSIS — M4310 Spondylolisthesis, site unspecified: Secondary | ICD-10-CM

## 2021-08-16 DIAGNOSIS — D1800 Hemangioma unspecified site: Secondary | ICD-10-CM | POA: Insufficient documentation

## 2021-08-16 DIAGNOSIS — M5412 Radiculopathy, cervical region: Secondary | ICD-10-CM | POA: Diagnosis not present

## 2021-08-16 DIAGNOSIS — L570 Actinic keratosis: Secondary | ICD-10-CM | POA: Insufficient documentation

## 2021-08-16 DIAGNOSIS — C4492 Squamous cell carcinoma of skin, unspecified: Secondary | ICD-10-CM | POA: Insufficient documentation

## 2021-08-16 DIAGNOSIS — D225 Melanocytic nevi of trunk: Secondary | ICD-10-CM | POA: Insufficient documentation

## 2021-08-16 MED ORDER — GABAPENTIN 300 MG PO CAPS: 300.0000 mg | ORAL_CAPSULE | Freq: Every day | ORAL | 4 refills | Status: DC

## 2021-08-16 NOTE — Progress Notes (Signed)
? ?[facility] ? ?PATIENT: Billy Carpenter ?DOB: 19-Apr-1946 ? ?REQUESTING CLINICIAN: [name], MD ?HISTORY FROM: Patient  ?REASON FOR VISIT: Left arm radiating pain  ? ? ?HISTORICAL ? ?CHIEF COMPLAINT:  ?Chief Complaint  ?Patient presents with  ? Follow-up  ?  Rm 12. Alone. ?Postural dizziness, syncope previous pt of Dr Jannifer Franklin. States dizziness is under control. ?C/o numbness and tingling in left arm, radiating down arm into thumb.  ? ?INTERVAL HISTORY 08/16/2021 ?This is a 76 year old gentleman past medical history hypertension, hyperlipidemia, dizziness who is presenting for follow-up.  Patient was last seen by Dr. Jannifer Franklin 2 years ago, at that time he was complaining of episode of syncope and postural dizziness.  He reports today that the syncope and dizziness are resolved, but now he is experiencing left shoulder blade pain and radiating pain down to the left arm all the way to the left thumb and left index finger.  He reports that left thumb and left index finger and middle finger are numb but he does have shooting pain at the level of the left arm, in the posterior aspect.  Pain is intermittent.  He did have joint injection back in October with some relief but he had a second injection that did not provide any relief.  He did follow-up with orthopedic, had MRI of the cervical spine which showed Axial cervical intermittent C6-C7 radicular pain secondary to severe cervical foraminal stenosis at C6-7 and C5-6. Denies any weakness of the left arm.  ?Currently he is getting physical therapy, and has declined surgical treatment. ? ? ?HISTORY OF PRESENT ILLNESS:  ?Billy Carpenter is a 76 year old right-handed white male with a history of episodes of syncope and postural dizziness.  The patient has a history of atrial fibrillation, he has a loop recorder in and has not yet had a syncopal event while wearing the loop recorder.  No evidence of atrial fibrillation or other cardiac rhythm abnormalities  have been noted.  The patient is followed by Dr. Caryl Comes from cardiology.  The patient has done quite well since last seen, he has increased his hydration, he is drinking 48 ounces of water a day and 48 ounces of decaf coffee a day.  The patient has not had any symptoms whatsoever while doing this.  The patient will occasionally have some dizziness with looking up or when walking in a dark room.  He also has reported some right leg numbness with walking, this has improved with back exercises.  He returns to the office today for an evaluation ? ? ?OTHER MEDICAL CONDITIONS: Dizziness, hyperlipidemia, hypertension  ? ? ?REVIEW OF SYSTEMS: Full 14 system review of systems performed and negative with exception of: as noted in the HPI  ? ?ALLERGIES: ?No Known Allergies ? ?HOME MEDICATIONS: ?Outpatient Medications Prior to Visit  ?Medication Sig Dispense Refill  ? atorvastatin (LIPITOR) 20 MG tablet Take 1 tablet (20 mg total) by mouth daily. 90 tablet 3  ? diltiazem (CARDIZEM CD) 120 MG 24 hr capsule Take 1 capsule (120 mg total) by mouth daily. 90 capsule 3  ? flecainide (TAMBOCOR) 50 MG tablet TAKE 1 TABLET BY MOUTH TWICE A DAY (Patient taking differently: 20 mg 2 (two) times daily.) 180 tablet 3  ? metoprolol tartrate (LOPRESSOR) 25 MG tablet TAKE 1 TABLET BY MOUTH EVERY 4 HOURS FOR 3 DOSES NIGHTLY AS NEEDED FOR PALPITATIONS 540 tablet 3  ? naproxen sodium (ALEVE) 220 MG tablet Take 220 mg by mouth 2 (two) times daily as  needed.    ? XARELTO 20 MG TABS tablet TAKE 1 TABLET BY MOUTH EVERY DAY WITH SUPPER 90 tablet 0  ? rivaroxaban (XARELTO) 20 MG TABS tablet TAKE 1 TABLET BY MOUTH EVERY DAY WITH SUPPER 90 tablet 1  ? ?No facility-administered medications prior to visit.  ? ? ?PAST MEDICAL HISTORY: ?Past Medical History:  ?Diagnosis Date  ? BPH (benign prostatic hyperplasia)   ? Carotid artery disease ([place])   ? Left internal carotid artery spontaneous dissection in the past  //   Doppler, October, 2013, minimal plaque,  0-39% bilateral  ? Chronic low back pain 05/03/2017  ? CVA (cerebral infarction)   ? left insular cortex stroke with left internal carotid artery dissection and near occlusion  ? Dyslipidemia   ? Hematuria   ? Hypertension   ? Mitral regurgitation   ? mild, echo, March, 2010  ? Persistent atrial fibrillation ([place])   ? Syncope   ? Syncope and collapse 12/21/2017  ? ? ?PAST SURGICAL HISTORY: ?Past Surgical History:  ?Procedure Laterality Date  ? BLADDER STONE REMOVAL    ? IR ANGIO INTRA EXTRACRAN SEL COM CAROTID INNOMINATE BILAT MOD SED  05/16/2019  ? IR ANGIO VERTEBRAL SEL VERTEBRAL BILAT MOD SED  05/16/2019  ? IR US GUIDE VASC ACCESS RIGHT  05/16/2019  ? LEFT HEART CATH AND CORONARY ANGIOGRAPHY N/A 06/27/2017  ? Procedure: LEFT HEART CATH AND CORONARY ANGIOGRAPHY;  Surgeon: Belva Crome, MD;  Location: [facility];  Service: Cardiovascular;  Laterality: N/A;  ? PROSTATE SURGERY  2009  ? WRIST SURGERY    ? Right  ? ? ?FAMILY HISTORY: ?Family History  ?Problem Relation Age of Onset  ? Arrhythmia Mother   ? Hypertension Mother   ? Arthritis Mother   ? Other Father   ?     tumor  ? Prostate cancer Father   ? CVA Neg Hx   ? ? ?SOCIAL HISTORY: ?Social History  ? ?Socioeconomic History  ? Marital status: Married  ?  Spouse name: Happy  ? Number of children: 3  ? Years of education: MBA  ? Highest education level: Not on file  ?Occupational History  ? Not on file  ?Tobacco Use  ? Smoking status: Never  ? Smokeless tobacco: Never  ?Vaping Use  ? Vaping Use: Never used  ?Substance and Sexual Activity  ? Alcohol use: Not Currently  ?  Comment: due to a-fib  ? Drug use: No  ? Sexual activity: Not on file  ?Other Topics Concern  ? Not on file  ?Social History Narrative  ? Lives with wife  ? Right handed  ? Caffeine use: 3 cups per day  ? ?Social Determinants of Health  ? ?Financial Resource Strain: Not on file  ?Food Insecurity: Not on file  ?Transportation Needs: Not on file  ?Physical Activity: Not on file  ?Stress: Not  on file  ?Social Connections: Not on file  ?Intimate Partner Violence: Not on file  ? ? ?PHYSICAL EXAM ? ?GENERAL EXAM/CONSTITUTIONAL: ?Vitals:  ?Vitals:  ? 08/16/21 1129  ?BP: 127/83  ?Pulse: 76  ?Weight: 147 lb (66.7 kg)  ?Height: 5' 7.5" (1.715 m)  ? ?Body mass index is 22.68 kg/m?. ?Wt Readings from Last 3 Encounters:  ?08/16/21 147 lb (66.7 kg)  ?06/02/21 146 lb 3.2 oz (66.3 kg)  ?11/16/20 148 lb (67.1 kg)  ? ?Patient is in no distress; well developed, nourished and groomed; neck is supple ? ?EYES: ?Pupils round and reactive to  light, Visual fields full to confrontation, Extraocular movements intacts,  ? ?MUSCULOSKELETAL: ?Gait, strength, tone, movements noted in Neurologic exam below ? ?NEUROLOGIC: ?MENTAL STATUS:  ?No flowsheet data found. ?awake, alert, oriented to person, place and time ?recent and remote memory intact ?normal attention and concentration ?language fluent, comprehension intact, naming intact ?fund of knowledge appropriate ? ?CRANIAL NERVE:  ?2nd, 3rd, 4th, 6th - pupils equal and reactive to light, visual fields full to confrontation, extraocular muscles intact, no nystagmus ?5th - facial sensation symmetric ?7th - facial strength symmetric ?8th - hearing intact ?9th - palate elevates symmetrically, uvula midline ?11th - shoulder shrug symmetric ?12th - tongue protrusion midline ? ?MOTOR:  ?normal bulk and tone, full strength in the BUE, BLE, no deficit  ? ?SENSORY:  ?normal and symmetric to light touch, pinprick, temperature, vibration, no deficit  ? ?COORDINATION:  ?finger-nose-finger, fine finger movements normal ? ?REFLEXES:  ?deep tendon reflexes present and symmetric ? ?GAIT/STATION:  ?normal ? ? ?DIAGNOSTIC DATA (LABS, IMAGING, TESTING) ?- I reviewed patient records, labs, notes, testing and imaging myself where available. ? ?Lab Results  ?Component Value Date  ? WBC 6.0 06/08/2020  ? HGB 15.9 06/08/2020  ? HCT 46.5 06/08/2020  ? MCV 94 06/08/2020  ? PLT 167 06/08/2020  ? ?    ?Component Value Date/Time  ? NA 141 06/08/2020 0757  ? K 4.1 06/08/2020 0757  ? CL 103 06/08/2020 0757  ? CO2 24 06/08/2020 0757  ? GLUCOSE 104 (H) 06/08/2020 0757  ? GLUCOSE 95 05/16/2019 0837  ? BUN 17 01/11/2

## 2021-08-16 NOTE — Patient Instructions (Addendum)
Continue physical therapy ?Start gabapentin 300 mg at 100 mg at night for neuropathy pain, can increase to 300 twice daily 300 twice daily as tolerated ?Follow-up with your doctors at [facility] ?Return in 6 months for follow-up ? ?

## 2021-08-22 NOTE — Progress Notes (Signed)
[facility] Summary Report / Loop Recorder 

## 2021-09-04 ENCOUNTER — Encounter: Payer: Self-pay | Admitting: Internal Medicine

## 2021-09-05 ENCOUNTER — Other Ambulatory Visit: Payer: Self-pay

## 2021-09-05 DIAGNOSIS — I480 Paroxysmal atrial fibrillation: Secondary | ICD-10-CM

## 2021-09-05 MED ORDER — RIVAROXABAN 20 MG PO TABS: ORAL_TABLET | ORAL | 0 refills | Status: DC

## 2021-09-05 NOTE — Telephone Encounter (Signed)
Prescription refill request for Xarelto received.  ?Indication: Afib  ?Last office visit: 06/02/2021 Billy Carpenter) ?Weight: 66.7kg ?Age: 76 ?Scr: 0.94 (12/22/20 via PCP)  ?CrCl: 62.45m/min ? ?Appropriate dose and refill sent to requested pharmacy.  ? ? ? ? ?

## 2021-09-12 ENCOUNTER — Ambulatory Visit (INDEPENDENT_AMBULATORY_CARE_PROVIDER_SITE_OTHER): Payer: Medicare Other

## 2021-09-12 DIAGNOSIS — R55 Syncope and collapse: Secondary | ICD-10-CM | POA: Diagnosis not present

## 2021-09-13 NOTE — Telephone Encounter (Signed)
The patient left a voicemail stating he recently switched insurance companies. When he get the bill, it is not reflecting his insurance. He would like for someone in billing to give him a call. ?

## 2021-09-14 LAB — CUP PACEART REMOTE DEVICE CHECK
Date Time Interrogation Session: 20230419072125
Implantable Pulse Generator Implant Date: 20201211

## 2021-09-29 NOTE — Progress Notes (Signed)
[facility] Summary Report / Loop Recorder 

## 2021-10-16 LAB — CUP PACEART REMOTE DEVICE CHECK
Date Time Interrogation Session: 20230519230310
Implantable Pulse Generator Implant Date: 20201211

## 2021-10-17 ENCOUNTER — Ambulatory Visit (INDEPENDENT_AMBULATORY_CARE_PROVIDER_SITE_OTHER): Payer: Medicare Other

## 2021-10-17 DIAGNOSIS — R55 Syncope and collapse: Secondary | ICD-10-CM

## 2021-11-02 NOTE — Progress Notes (Signed)
[facility] Summary Report / Loop Recorder 

## 2021-11-18 ENCOUNTER — Encounter: Payer: Self-pay | Admitting: Neurology

## 2021-11-21 ENCOUNTER — Ambulatory Visit (INDEPENDENT_AMBULATORY_CARE_PROVIDER_SITE_OTHER): Payer: Medicare Other

## 2021-11-21 DIAGNOSIS — R55 Syncope and collapse: Secondary | ICD-10-CM

## 2021-11-22 LAB — CUP PACEART REMOTE DEVICE CHECK
Date Time Interrogation Session: 20230627141136
Implantable Pulse Generator Implant Date: 20201211

## 2021-11-30 ENCOUNTER — Other Ambulatory Visit: Payer: Self-pay | Admitting: Internal Medicine

## 2021-11-30 DIAGNOSIS — I480 Paroxysmal atrial fibrillation: Secondary | ICD-10-CM

## 2021-11-30 NOTE — Telephone Encounter (Signed)
Prescription refill request for Xarelto received.  Indication: PAF Last office visit: 06/02/21  Olin Pia MD Weight: 66.3kg Age: 76 Scr: 0.97 on 06/08/20 CrCl: 59.81  Based on above findings Xarelto '20mg'$  daily is the appropriate dose.  Refill approved.

## 2021-12-16 NOTE — Progress Notes (Signed)
[facility] Summary Report / Loop Recorder 

## 2021-12-19 ENCOUNTER — Telehealth: Payer: Self-pay

## 2021-12-19 NOTE — Telephone Encounter (Signed)
Pt left a message that we do not have his insurance [employer] on file. He would like to speak with someone about this.  I called the patient and explained that the only thing the device clinic does is put the charges in. We do not do anything with the Insurance portion. I can not explain why [employer] blue shield is not showing up on his bill. I let the patient know that I will send a phone note to the billing department and have someone give him a call back. I told him if not today at least tomorrow someone will give him a call back. The patient verbalized understanding. His phone number is [phone].  If someone from billing can give him a call please.

## 2021-12-20 NOTE — Telephone Encounter (Signed)
I called [name] and advised that I had attached insurance to his last device check.  I also asked him to let me know should this occur again.

## 2021-12-22 LAB — CUP PACEART REMOTE DEVICE CHECK
Date Time Interrogation Session: 20230724230846
Implantable Pulse Generator Implant Date: 20201211

## 2021-12-26 ENCOUNTER — Ambulatory Visit (INDEPENDENT_AMBULATORY_CARE_PROVIDER_SITE_OTHER): Payer: Medicare Other

## 2021-12-26 ENCOUNTER — Other Ambulatory Visit: Payer: Self-pay | Admitting: Internal Medicine

## 2021-12-26 DIAGNOSIS — R55 Syncope and collapse: Secondary | ICD-10-CM | POA: Diagnosis not present

## 2021-12-27 MED ORDER — DILTIAZEM HCL ER COATED BEADS 120 MG PO CP24: 120.0000 mg | ORAL_CAPSULE | Freq: Every day | ORAL | 1 refills | Status: DC

## 2022-01-26 NOTE — Progress Notes (Signed)
[facility] Summary Report / Loop Recorder 

## 2022-01-27 ENCOUNTER — Ambulatory Visit (INDEPENDENT_AMBULATORY_CARE_PROVIDER_SITE_OTHER): Payer: Medicare Other

## 2022-01-27 DIAGNOSIS — R55 Syncope and collapse: Secondary | ICD-10-CM | POA: Diagnosis not present

## 2022-01-31 LAB — CUP PACEART REMOTE DEVICE CHECK
Date Time Interrogation Session: 20230905113002
Implantable Pulse Generator Implant Date: 20201211

## 2022-02-16 ENCOUNTER — Ambulatory Visit: Payer: Medicare Other | Admitting: Neurology

## 2022-02-16 ENCOUNTER — Encounter: Payer: Self-pay | Admitting: Neurology

## 2022-02-16 VITALS — BP 123/80 | HR 66 | Ht 67.0 in | Wt 147.0 lb

## 2022-02-16 DIAGNOSIS — M5412 Radiculopathy, cervical region: Secondary | ICD-10-CM | POA: Diagnosis not present

## 2022-02-16 DIAGNOSIS — M4310 Spondylolisthesis, site unspecified: Secondary | ICD-10-CM

## 2022-02-16 NOTE — Progress Notes (Signed)
[facility] Summary Report / Loop Recorder 

## 2022-02-16 NOTE — Progress Notes (Addendum)
[facility]  PATIENT: Billy Carpenter DOB: [birth year]  REQUESTING CLINICIAN: [name], MD HISTORY FROM: Patient  REASON FOR VISIT: Left hands.  Radiating pain    HISTORICAL  CHIEF COMPLAINT:  Chief Complaint  Patient presents with   Follow-up    RM 7 alone Pt is well and stable, states pain is about the same as last visit. Not much change    INTERVAL HISTORY 02/16/22:  Patient presents today for follow-up, since last visit he reports that his symptoms are stable.  He still have some left shoulder pain but is tolerable.  Someday he does not even feel the pain.  He takes Advil as needed.  He reports that he still works out every day playing golf, hiking. The numbness and tingling are constant.  He also does exercises every morning.  He has completed physical therapy. Denies any weakness of the arm of hand.    INTERVAL HISTORY 08/16/2021 This is a 76 year old gentleman past medical history hypertension, hyperlipidemia, dizziness who is presenting for follow-up.  Patient was last seen by Dr. Jannifer Carpenter 2 years ago, at that time he was complaining of episode of syncope and postural dizziness.  He reports today that the syncope and dizziness are resolved, but now he is experiencing left shoulder blade pain and radiating pain down to the left arm all the way to the left thumb and left index finger.  He reports that left thumb and left index finger and middle finger are numb but he does have shooting pain at the level of the left arm, in the posterior aspect.  Pain is intermittent.  He did have joint injection back in October with some relief but he had a second injection that did not provide any relief.  He did follow-up with orthopedic, had MRI of the cervical spine which showed Axial cervical intermittent C6-C7 radicular pain secondary to severe cervical foraminal stenosis at C6-7 and C5-6. Denies any weakness of the left arm.  Currently he is getting physical therapy,  and has declined surgical treatment.   HISTORY OF PRESENT ILLNESS:  Billy Carpenter is a 76 year old right-handed white male with a history of episodes of syncope and postural dizziness.  The patient has a history of atrial fibrillation, he has a loop recorder in and has not yet had a syncopal event while wearing the loop recorder.  No evidence of atrial fibrillation or other cardiac rhythm abnormalities have been noted.  The patient is followed by Dr. Caryl Carpenter from cardiology.  The patient has done quite well since last seen, he has increased his hydration, he is drinking 48 ounces of water a day and 48 ounces of decaf coffee a day.  The patient has not had any symptoms whatsoever while doing this.  The patient will occasionally have some dizziness with looking up or when walking in a dark room.  He also has reported some right leg numbness with walking, this has improved with back exercises.  He returns to the office today for an evaluation   OTHER MEDICAL CONDITIONS: Dizziness, hyperlipidemia, hypertension    REVIEW OF SYSTEMS: Full 14 system review of systems performed and negative with exception of: as noted in the HPI   ALLERGIES: No Known Allergies  HOME MEDICATIONS: Outpatient Medications Prior to Visit  Medication Sig Dispense Refill   atorvastatin (LIPITOR) 20 MG tablet Take 1 tablet (20 mg total) by mouth daily. 90 tablet 3   diltiazem (CARDIZEM CD) 120 MG 24 hr capsule Take 1  capsule (120 mg total) by mouth daily. 90 capsule 1   flecainide (TAMBOCOR) 50 MG tablet TAKE 1 TABLET BY MOUTH TWICE A DAY (Patient taking differently: 20 mg 2 (two) times daily.) 180 tablet 3   gabapentin (NEURONTIN) 100 MG capsule Take 100 mg by mouth 3 (three) times daily.     metoprolol tartrate (LOPRESSOR) 25 MG tablet TAKE 1 TABLET BY MOUTH EVERY 4 HOURS FOR 3 DOSES NIGHTLY AS NEEDED FOR PALPITATIONS 540 tablet 3   naproxen sodium (ALEVE) 220 MG tablet Take 220 mg by mouth 2 (two) times daily as needed.      rivaroxaban (XARELTO) 20 MG TABS tablet TAKE 1 TABLET BY MOUTH ONCE DAILY WITH SUPPER 90 tablet 1   gabapentin (NEURONTIN) 300 MG capsule Take 1 capsule (300 mg total) by mouth at bedtime. (Patient not taking: Reported on 02/16/2022) 90 capsule 4   No facility-administered medications prior to visit.    PAST MEDICAL HISTORY: Past Medical History:  Diagnosis Date   BPH (benign prostatic hyperplasia)    Carotid artery disease ([place])    Left internal carotid artery spontaneous dissection in the past  //   Doppler, October, 2013, minimal plaque, 0-39% bilateral   Chronic low back pain 05/03/2017   CVA (cerebral infarction)    left insular cortex stroke with left internal carotid artery dissection and near occlusion   Dyslipidemia    Hematuria    Hypertension    Mitral regurgitation    mild, echo, March, 2010   Persistent atrial fibrillation (HCC)    Syncope    Syncope and collapse 12/21/2017    PAST SURGICAL HISTORY: Past Surgical History:  Procedure Laterality Date   BLADDER STONE REMOVAL     IR ANGIO INTRA EXTRACRAN SEL COM CAROTID INNOMINATE BILAT MOD SED  05/16/2019   IR ANGIO VERTEBRAL SEL VERTEBRAL BILAT MOD SED  05/16/2019   IR US GUIDE VASC ACCESS RIGHT  05/16/2019   LEFT HEART CATH AND CORONARY ANGIOGRAPHY N/A 06/27/2017   Procedure: LEFT HEART CATH AND CORONARY ANGIOGRAPHY;  Surgeon: Belva Crome, MD;  Location: [facility];  Service: Cardiovascular;  Laterality: N/A;   PROSTATE SURGERY  2009   WRIST SURGERY     Right    FAMILY HISTORY: Family History  Problem Relation Age of Onset   Arrhythmia Mother    Hypertension Mother    Arthritis Mother    Other Father        tumor   Prostate cancer Father    CVA Neg Hx     SOCIAL HISTORY: Social History   Socioeconomic History   Marital status: Married    Spouse name: Happy   Number of children: 3   Years of education: MBA   Highest education level: Not on file  Occupational History   Not on file   Tobacco Use   Smoking status: Never   Smokeless tobacco: Never  Vaping Use   Vaping Use: Never used  Substance and Sexual Activity   Alcohol use: Not Currently    Comment: due to a-fib   Drug use: No   Sexual activity: Not on file  Other Topics Concern   Not on file  Social History Narrative   Lives with wife   Right handed   Caffeine use: 3 cups per day   Social Determinants of Health   Financial Resource Strain: Not on file  Food Insecurity: Not on file  Transportation Needs: Not on file  Physical Activity: Not on file  Stress: Not on file  Social Connections: Not on file  Intimate Partner Violence: Not on file    PHYSICAL EXAM  GENERAL EXAM/CONSTITUTIONAL: Vitals:  Vitals:   02/16/22 1016  BP: 123/80  Pulse: 66  Weight: 147 lb (66.7 kg)  Height: '5\' 7"'$  (1.702 m)    Body mass index is 23.02 kg/m. Wt Readings from Last 3 Encounters:  02/16/22 147 lb (66.7 kg)  08/16/21 147 lb (66.7 kg)  06/02/21 146 lb 3.2 oz (66.3 kg)   Patient is in no distress; well developed, nourished and groomed; neck is supple  EYES: Pupils round and reactive to light, Visual fields full to confrontation, Extraocular movements intacts,   MUSCULOSKELETAL: Gait, strength, tone, movements noted in Neurologic exam below  NEUROLOGIC: MENTAL STATUS:      No data to display         awake, alert, oriented to person, place and time recent and remote memory intact normal attention and concentration language fluent, comprehension intact, naming intact fund of knowledge appropriate  CRANIAL NERVE:  2nd, 3rd, 4th, 6th - pupils equal and reactive to light, visual fields full to confrontation, extraocular muscles intact, no nystagmus 5th - facial sensation symmetric 7th - facial strength symmetric 8th - hearing intact 9th - palate elevates symmetrically, uvula midline 11th - shoulder shrug symmetric 12th - tongue protrusion midline  MOTOR:  normal bulk and tone, full strength  in the BUE, BLE, full strength, no deficit   SENSORY:  normal and symmetric to light touch, pinprick, temperature, vibration, no deficit   COORDINATION:  finger-nose-finger, fine finger movements normal  REFLEXES:  deep tendon reflexes present and symmetric except for an absent right triceps  GAIT/STATION:  normal   DIAGNOSTIC DATA (LABS, IMAGING, TESTING) - I reviewed patient records, labs, notes, testing and imaging myself where available.  Lab Results  Component Value Date   WBC 6.0 06/08/2020   HGB 15.9 06/08/2020   HCT 46.5 06/08/2020   MCV 94 06/08/2020   PLT 167 06/08/2020      Component Value Date/Time   NA 141 06/08/2020 0757   K 4.1 06/08/2020 0757   CL 103 06/08/2020 0757   CO2 24 06/08/2020 0757   GLUCOSE 104 (H) 06/08/2020 0757   GLUCOSE 95 05/16/2019 0837   BUN 17 06/08/2020 0757   CREATININE 0.97 06/08/2020 0757   CALCIUM 8.9 06/08/2020 0757   PROT 6.1 05/15/2019 0811   ALBUMIN 4.4 05/15/2019 0811   AST 40 05/15/2019 0811   ALT 75 (H) 05/15/2019 0811   ALKPHOS 84 05/15/2019 0811   BILITOT 0.8 05/15/2019 0811   GFRNONAA 76 06/08/2020 0757   GFRAA 87 06/08/2020 0757   No results found for: "CHOL", "HDL", "LDLCALC", "LDLDIRECT", "TRIG", "CHOLHDL" No results found for: "HGBA1C" Lab Results  Component Value Date   VITAMINB12 385 01/01/2017   No results found for: "TSH"  MRI of the cervical spine demonstrates multilevel cervical spondylosis. Severe foraminal stenosis at C5-6 and C6-7 on the left. Retrolisthesis of C3-C4. Facet arthrosis at multiple levels. T1-T2. Loss of caliber flow in the left vertebral artery from C2-3 to C5-6.    ASSESSMENT AND PLAN  76 y.o. year old male who is presenting for follow up for left arm pain, in the posterior aspect of the left arm and left thumb, index finger and middle finger numbness.  He has completed PT, states that the pain is tolerable, and takes Advil as needed. He is also on Gabapentin nightly. On exam he  has  a absent right triceps reflex but otherwise normal examination, normal strength. He is still deferring surgery. At this time, continue current meds, continue with exercise and contact us if you pain worsen or if you experience new weakness. Voices understanding.     1. Cervical radiculopathy   2. Degenerative spondylolisthesis      Patient Instructions  Continue current medications  Continue with daily exercise Continue to follow up with you PCP  Return if worse, if you experience worsening pain or new weakness, at that time, we will consider repeat MRI Cervical spine and EMG  No orders of the defined types were placed in this encounter.   No orders of the defined types were placed in this encounter.   Return if symptoms worsen or fail to improve.    Alric Ran, MD 02/16/2022, 10:38 AM  [facility] [address] [place], [place] [address] [phone]

## 2022-02-16 NOTE — Patient Instructions (Signed)
Continue current medications  Continue with daily exercise Continue to follow up with you PCP  Return if worse, if you experience worsening pain or new weakness, at that time, we will consider repeat MRI Cervical spine and EMG

## 2022-02-27 LAB — CUP PACEART REMOTE DEVICE CHECK
Date Time Interrogation Session: 20231002074317
Implantable Pulse Generator Implant Date: 20201211

## 2022-03-01 ENCOUNTER — Ambulatory Visit (INDEPENDENT_AMBULATORY_CARE_PROVIDER_SITE_OTHER): Payer: Medicare Other

## 2022-03-01 ENCOUNTER — Other Ambulatory Visit: Payer: Self-pay | Admitting: *Deleted

## 2022-03-01 DIAGNOSIS — R55 Syncope and collapse: Secondary | ICD-10-CM | POA: Diagnosis not present

## 2022-03-01 MED ORDER — FLECAINIDE ACETATE 50 MG PO TABS: 50.0000 mg | ORAL_TABLET | Freq: Two times a day (BID) | ORAL | 0 refills | Status: DC

## 2022-03-10 NOTE — Progress Notes (Signed)
[facility] Summary Report / Loop Recorder 

## 2022-03-13 ENCOUNTER — Other Ambulatory Visit: Payer: Self-pay

## 2022-03-13 MED ORDER — FLECAINIDE ACETATE 50 MG PO TABS: 50.0000 mg | ORAL_TABLET | Freq: Two times a day (BID) | ORAL | 0 refills | Status: DC

## 2022-04-03 ENCOUNTER — Ambulatory Visit (INDEPENDENT_AMBULATORY_CARE_PROVIDER_SITE_OTHER): Payer: Medicare Other

## 2022-04-03 DIAGNOSIS — R55 Syncope and collapse: Secondary | ICD-10-CM

## 2022-04-04 LAB — CUP PACEART REMOTE DEVICE CHECK
Date Time Interrogation Session: 20231105230325
Implantable Pulse Generator Implant Date: 20201211

## 2022-05-08 ENCOUNTER — Ambulatory Visit (INDEPENDENT_AMBULATORY_CARE_PROVIDER_SITE_OTHER): Payer: Medicare Other

## 2022-05-08 DIAGNOSIS — R55 Syncope and collapse: Secondary | ICD-10-CM

## 2022-05-09 LAB — CUP PACEART REMOTE DEVICE CHECK
Date Time Interrogation Session: 20231210230245
Implantable Pulse Generator Implant Date: 20201211

## 2022-05-09 NOTE — Progress Notes (Signed)
[facility] Summary Report / Loop Recorder 

## 2022-05-16 ENCOUNTER — Encounter: Payer: Self-pay | Admitting: Internal Medicine

## 2022-05-24 ENCOUNTER — Telehealth: Payer: Self-pay

## 2022-05-24 NOTE — Telephone Encounter (Signed)
The patient states he got a bill and we are not using his [employer] supplemental insurance again. He said the last time Suanne Marker was able to help him with that. I told him I will reach out to her and have her call him. He states Suanne Marker is great at resolving this particular issue and he would love a phone call from her. His phone number is [phone].

## 2022-06-01 NOTE — Telephone Encounter (Signed)
Spoke with pt and forms completed and placed with front desk.  Pt will pick up 06/01/2022.

## 2022-06-04 ENCOUNTER — Other Ambulatory Visit: Payer: Self-pay | Admitting: Internal Medicine

## 2022-06-04 DIAGNOSIS — I480 Paroxysmal atrial fibrillation: Secondary | ICD-10-CM

## 2022-06-05 NOTE — Telephone Encounter (Signed)
Prescription refill request for Xarelto received.  Indication:afib Last office visit:1/23 Weight:66.7 kg Age:77 Scr:0.9 CrCl:63.82  ml/min  Prescription refilled

## 2022-06-12 ENCOUNTER — Ambulatory Visit: Payer: Medicare Other | Attending: Internal Medicine

## 2022-06-12 DIAGNOSIS — R55 Syncope and collapse: Secondary | ICD-10-CM | POA: Diagnosis not present

## 2022-06-14 ENCOUNTER — Other Ambulatory Visit: Payer: Self-pay | Admitting: Internal Medicine

## 2022-06-14 ENCOUNTER — Encounter: Payer: Self-pay | Admitting: Internal Medicine

## 2022-06-14 LAB — CUP PACEART REMOTE DEVICE CHECK
Date Time Interrogation Session: 20240112230544
Implantable Pulse Generator Implant Date: 20201211

## 2022-06-14 MED ORDER — METOPROLOL TARTRATE 25 MG PO TABS: ORAL_TABLET | ORAL | 0 refills | Status: DC

## 2022-06-14 NOTE — Addendum Note (Signed)
Addended by: Sharee Holster R on: 06/14/2022 10:14 AM   Modules accepted: Orders

## 2022-06-15 NOTE — Progress Notes (Signed)
Carelink Summary Report / Loop Recorder

## 2022-06-19 ENCOUNTER — Other Ambulatory Visit: Payer: Self-pay | Admitting: Internal Medicine

## 2022-06-27 ENCOUNTER — Telehealth: Payer: Self-pay

## 2022-06-27 NOTE — Telephone Encounter (Signed)
..  Pre-operative Risk Assessment    Patient Name: Billy Carpenter  DOB: 1945-12-20 MRN: [id number]      Request for Surgical Clearance    Procedure:   COLONOSCOPY  Date of Surgery:  Clearance TBD                                 Surgeon:  DR Paulita Fujita Surgeon's Group or Practice Name:  [facility] Phone number:  [phone] Fax number:  [phone]   Type of Clearance Requested:   - Medical  - Pharmacy:  Hold Rivaroxaban (Xarelto)     Type of Anesthesia:   PROPOFOL   Additional requests/questions:    Gwenlyn Found   06/27/2022, 1:07 PM

## 2022-06-27 NOTE — Telephone Encounter (Signed)
Primary Cardiologist:None  Chart reviewed as part of pre-operative protocol coverage. Because of Jermell Holeman Wickersham's past medical history and time since last visit, he/she will require a follow-up visit in order to better assess preoperative cardiovascular risk.  Pre-op covering staff: - Patient has an appointment with Dr. Caryl Comes on 07/04/22 at which time clearance will be addressed.  - Please contact requesting surgeon's office via preferred method (i.e, phone, fax) to inform them of need for appointment prior to surgery.  This message will also be routed to pharmacy pool for input on holding anticoagulant agent as requested below so that this information is available at time of patient's appointment.   Emmaline Life, NP-C  06/27/2022, 2:05 PM [address] Office [phone] Fax [phone]

## 2022-06-28 NOTE — Telephone Encounter (Signed)
Pt is scheduled to see Dr. Caryl Comes 07/04/22, clearance will be addressed at that time.  Will route to the requesting surgeon's office to make them aware.

## 2022-06-28 NOTE — Telephone Encounter (Signed)
Patient with diagnosis of afib on Xarelto for anticoagulation.    Procedure: colonoscopy Date of procedure: TBD  CHA2DS2-VASc Score = 7  This indicates a 11.2% annual risk of stroke. The patient's score is based upon: CHF History: 1 HTN History: 1 Diabetes History: 0 Stroke History: 2 Vascular Disease History: 1 Age Score: 2 Gender Score: 0   CrCl 66m/min Platelet count 167K  Per office protocol, patient can hold Xarelto for 1 day prior to procedure. Resume as soon as safely possible after procedure given elevated CV risk.  **This guidance is not considered finalized until pre-operative APP has relayed final recommendations.**

## 2022-07-04 ENCOUNTER — Ambulatory Visit: Payer: Medicare Other | Attending: Internal Medicine | Admitting: Internal Medicine

## 2022-07-04 ENCOUNTER — Encounter: Payer: Self-pay | Admitting: Internal Medicine

## 2022-07-04 VITALS — BP 117/82 | HR 80 | Ht 67.0 in | Wt 153.0 lb

## 2022-07-04 DIAGNOSIS — R55 Syncope and collapse: Secondary | ICD-10-CM | POA: Diagnosis not present

## 2022-07-04 DIAGNOSIS — I5020 Unspecified systolic (congestive) heart failure: Secondary | ICD-10-CM | POA: Diagnosis not present

## 2022-07-04 DIAGNOSIS — I4729 Other ventricular tachycardia: Secondary | ICD-10-CM

## 2022-07-04 DIAGNOSIS — I480 Paroxysmal atrial fibrillation: Secondary | ICD-10-CM | POA: Diagnosis not present

## 2022-07-04 DIAGNOSIS — R42 Dizziness and giddiness: Secondary | ICD-10-CM

## 2022-07-04 NOTE — Progress Notes (Signed)
Because go to [place] 715 so when she gets there      Patient Care Team: [name], MD as PCP - General (Family Medicine) Deboraha Sprang, MD as PCP - Electrophysiology (Cardiology) Suella Broad, MD as Consulting Physician (Physical Medicine and Rehabilitation)   HPI  Billy Carpenter is a 77 y.o. male seen in  follow-up for recurrent syncope,  orthostatic hypotension  and nonsustained ventricular tachycardia. He is s/p linq implant. Atrial fibrillation treated with flecainide and anticoagulated with Xarelto.    Has a history of a stroke and a remote carotid artery dissection.   Thromboembolic risk factors ( age -16, HTN-1, TIA/CVA-2, Vasc disease -1, CHF-1) for a CHADSVASc Score of >=7   He is also been seen by neurology.  Not withstanding negative EEG he was put on Keppra because of the concern that a seizure following his stroke could have been responsible for his syncope.  Subsequently stopped   Recurrent AFib--  none by symptoms or by monitor  He also needs preoperative clearance for colonoscopy.  At one point he was 160lbs but now he is 145 lbs.   The patient denies chest pain, shortness of breath, nocturnal dyspnea, orthopnea or peripheral edema.  There have been no palpitations or syncope.    Interval painless hematuria.  Has a CT scan ordered.  Do not know about plans for cystoscopy  Dizziness is interesting, he notes it most problematic when he is walking in low light with his wife in the evenings or outside in the dark.  Does not have it playing golf, actually golfed his age this year. .   Date Cr K Hgb  12/20 1.07 3.9 16.6  1/22  0.97 4.1 15.9  /           DATE TEST EF    10/15 Echo  60-65%    9//18 Echo   30-35 % Mild RV dysfn  1/19 LHC  50 % Normal CA  1/19 Echo  45-50%    12/20 Echo  55-60%         DATE PR interval QRSduration Dose  6/21 210 86 75  6/22  180 84 50  1/24 210 84 50    Past Medical History:  Diagnosis Date   BPH (benign  prostatic hyperplasia)    Carotid artery disease ([place])    Left internal carotid artery spontaneous dissection in the past  //   Doppler, October, 2013, minimal plaque, 0-39% bilateral   Chronic low back pain 05/03/2017   CVA (cerebral infarction)    left insular cortex stroke with left internal carotid artery dissection and near occlusion   Dyslipidemia    Hematuria    Hypertension    Mitral regurgitation    mild, echo, March, 2010   Persistent atrial fibrillation ([facility])    Syncope    Syncope and collapse 12/21/2017    Past Surgical History:  Procedure Laterality Date   BLADDER STONE REMOVAL     IR ANGIO INTRA EXTRACRAN SEL COM CAROTID INNOMINATE BILAT MOD SED  05/16/2019   IR ANGIO VERTEBRAL SEL VERTEBRAL BILAT MOD SED  05/16/2019   IR US GUIDE VASC ACCESS RIGHT  05/16/2019   LEFT HEART CATH AND CORONARY ANGIOGRAPHY N/A 06/27/2017   Procedure: LEFT HEART CATH AND CORONARY ANGIOGRAPHY;  Surgeon: Belva Crome, MD;  Location: [facility];  Service: Cardiovascular;  Laterality: N/A;   PROSTATE SURGERY  2009   WRIST SURGERY     Right  Current Meds  Medication Sig   atorvastatin (LIPITOR) 20 MG tablet Take 1 tablet (20 mg total) by mouth daily.   diltiazem (CARDIZEM CD) 120 MG 24 hr capsule Take 1 capsule by mouth once daily   flecainide (TAMBOCOR) 50 MG tablet Take 1 tablet (50 mg total) by mouth 2 (two) times daily.   gabapentin (NEURONTIN) 100 MG capsule Take 100 mg by mouth 3 (three) times daily.   metoprolol tartrate (LOPRESSOR) 25 MG tablet TAKE 1 TABLET BY MOUTH EVERY 4 HOURS FOR 3 DOSES NIGHTLY AS NEEDED FOR PALPITATIONS. Please keep your upcoming appointment for future refills.   naproxen sodium (ALEVE) 220 MG tablet Take 220 mg by mouth 2 (two) times daily as needed.   XARELTO 20 MG TABS tablet TAKE 1 TABLET BY MOUTH ONCE DAILY WITH SUPPER   No Known Allergies  .    Physical Exam: BP 117/82 (Patient Position: Standing)   Pulse 80   Ht '5\' 7"'$  (1.702 m)   Wt 153  lb (69.4 kg)   SpO2 96%   BMI 23.96 kg/m  Well developed and nourished in no acute distress HENT normal Neck supple with JVP-  flat   Clear Regular rate and rhythm, no murmurs or gallops Abd-soft with active BS No Clubbing cyanosis edema Skin-warm and dry A & Oriented  Grossly normal sensory and motor function Romberg is positive  ECG sinus at 67 Intervals 21/08/41      Assessment and  Plan:  Syncope abrupt onset/offset-recurrent   Orthostatic lightheadedness-    Atrial fibrillation-paroxysmal   Prior stroke   Cardiomyopathy-  nonischemic-mild   ventricular tachycardia-nonsustained  No interval syncope.  Decreasing frequency of atrial fibrillation.  Continue flecainide 50 twice daily.  Bleeding as noted above, evaluation ongoing.  Balance issues are curious, with a positive Romberg and then being more problematic in the evening with low light I wonder whether this is neuro-ophthalmological issue, I suggested that he talk with his primary care about her neurological referral

## 2022-07-04 NOTE — Patient Instructions (Signed)
Medication Instructions:  Your physician recommends that you continue on your current medications as directed. Please refer to the Current Medication list given to you today.  *If you need a refill on your cardiac medications before your next appointment, please call your pharmacy*   Lab Work: None ordered.  If you have labs (blood work) drawn today and your tests are completely normal, you will receive your results only by: MyChart Message (if you have MyChart) OR A paper copy in the mail If you have any lab test that is abnormal or we need to change your treatment, we will call you to review the results.   Testing/Procedures: None ordered.    Follow-Up: At [facility], you and your health needs are our priority.  As part of our continuing mission to provide you with exceptional heart care, we have created designated Provider Care Teams.  These Care Teams include your primary Cardiologist (physician) and Advanced Practice Providers (APPs -  Physician Assistants and Nurse Practitioners) who all work together to provide you with the care you need, when you need it.  We recommend signing up for the patient portal called "MyChart".  Sign up information is provided on this After Visit Summary.  MyChart is used to connect with patients for Virtual Visits (Telemedicine).  Patients are able to view lab/test results, encounter notes, upcoming appointments, etc.  Non-urgent messages can be sent to your provider as well.   To learn more about what you can do with MyChart, go to [email or web address].    Your next appointment:   12 months with Dr Klein 

## 2022-07-07 NOTE — Telephone Encounter (Signed)
Hello Dr. Caryl Comes  [name] was seen by you on 07/04/2022 for follow-up and preoperative clearance.  In your note you mentioned that he would need clearance for his colonoscopy however your recommendation was mentioned in the assessment and plan section.  Do you feel that [name] is at acceptable risk to proceed with his colonoscopy procedure?  Thank you very much for your time and looking forward to response.  Please route your response to P CV DIV PREOP  Ambrose Pancoast, NP

## 2022-07-10 NOTE — Telephone Encounter (Signed)
   Patient Name: Billy Carpenter  DOB: March 19, 1946 MRN: [id number]  Primary Cardiologist: None  Chart reviewed as part of pre-operative protocol coverage. Given past medical history and time since last visit, based on ACC/AHA guidelines, Billy Carpenter is at acceptable risk for the planned procedure without further cardiovascular testing.   Per office protocol, patient can hold Xarelto for 1 day prior to procedure. Resume as soon as safely possible after procedure given elevated CV risk.   I will route this recommendation to the requesting party via Epic fax function and remove from pre-op pool.  Please call with questions.  Mable Fill, Marissa Nestle, NP 07/10/2022, 7:24 AM

## 2022-07-16 LAB — CUP PACEART REMOTE DEVICE CHECK
Date Time Interrogation Session: 20240214230456
Implantable Pulse Generator Implant Date: 20201211

## 2022-07-17 ENCOUNTER — Ambulatory Visit (INDEPENDENT_AMBULATORY_CARE_PROVIDER_SITE_OTHER): Payer: Medicare Other

## 2022-07-17 ENCOUNTER — Ambulatory Visit: Payer: Medicare Other | Attending: Internal Medicine

## 2022-07-17 ENCOUNTER — Telehealth: Payer: Self-pay | Admitting: Internal Medicine

## 2022-07-17 DIAGNOSIS — R55 Syncope and collapse: Secondary | ICD-10-CM

## 2022-07-17 NOTE — Telephone Encounter (Signed)
Reviewed this information with device clinic.  Pt will need to go home in order to transmit episode.  Pt is aware.  His [place] golf [place] to drive him home and he will transmit.  He is aware he will contacted with further instructions once this information has been obtained.

## 2022-07-17 NOTE — Telephone Encounter (Signed)
Pt has a lnq22. The patient cannot send a manual transmission. I let the patient speak with Mandy, rn.

## 2022-07-17 NOTE — Telephone Encounter (Signed)
Patient c/o Palpitations:  High priority if patient c/o lightheadedness, shortness of breath, or chest pain  How long have you had palpitations/irregular HR/ Afib? Are you having the symptoms now? Started 30 mins ago.   Are you currently experiencing lightheadedness, SOB or CP? Lightheaded, SOB and felt like his vision was very distorted   Do you have a history of afib (atrial fibrillation) or irregular heart rhythm? Yes  Have you checked your BP or HR? (document readings if available): No  Are you experiencing any other symptoms? Patient states that he almost blacked out while on the golf course. He states that he is on his way to the office soon.

## 2022-07-17 NOTE — Telephone Encounter (Signed)
Spoke with pt who was playing gold this AM.  He reports becoming extremely dizzy and with blurry vision.  He had to lay down on the green in order to not pass out.  He is currently sitting in his car but feeling better.  His wife is on her way to pick him up as it is unsafe for him to drive.  Pt has not way to check his VS currently.  He does have a loop recorder.  He did not press it during to episode but has now (appr 30 mins after episode).  He reports taking his normal medications this AM and eating/drinking like normal.  Advised I will notify Dr Caryl Comes and his nurse for further instructions.

## 2022-07-17 NOTE — Telephone Encounter (Signed)
We are unable to obtain a transmission.  Patient is on his way for in office interrogation.

## 2022-07-18 ENCOUNTER — Encounter: Payer: Self-pay | Admitting: Internal Medicine

## 2022-07-19 NOTE — Telephone Encounter (Signed)
GM  I would have him  1) take no metoprolol 2) take his diltiazem at night Thanks SK

## 2022-07-21 LAB — CUP PACEART INCLINIC DEVICE CHECK
Date Time Interrogation Session: 20240219084404
Implantable Pulse Generator Implant Date: 20201211

## 2022-07-21 NOTE — Progress Notes (Signed)
Patient brought in today to interrogate his loop recorder due to experiencing recent periods of increased fatigue, "shakes" and weakness.  He was unable to send a manual transmission from his device.   Patient stated while playing golf around 11am today he became very dizzy with blurred vision.  He had to lay down on the golf course to keep from passing out.  He was able to get to his car and wait for his wife to pick him up.  Reports he did feel better after resting but continues to feel fatigue and "washed out".   Interrogation of ILR performed today by [employer], with [employer].  He reviewed results with Dr Caryl Comes.  Nothing on report correlates with patient symptoms.  Dr. Caryl Comes reviewed with patient.  Possible neurologic involvement.  I, also, dicussed patient use of Metoprolol he took that am thinking he was being "proactive" in preventing AF attack during golf.  Patient says he normally doesn't take his metoprolol in the am.  We discussed there could potentially be lowering effect of his blood pressure and likely should not do that.  I recommended he take regular recordings of his blood pressure to see what may be happening in that regard and to notify us if blood pressures are running on the low end.  He may need adjustment with his medications.    He will follow up with any concerns and we will continue to provide ongoing monitoring.

## 2022-07-31 NOTE — Progress Notes (Signed)
[facility] Summary Report / Loop Recorder 

## 2022-08-19 LAB — CUP PACEART REMOTE DEVICE CHECK
Date Time Interrogation Session: 20240318230324
Implantable Pulse Generator Implant Date: 20201211

## 2022-08-21 ENCOUNTER — Ambulatory Visit: Payer: Medicare Other | Attending: Internal Medicine

## 2022-08-21 DIAGNOSIS — R55 Syncope and collapse: Secondary | ICD-10-CM

## 2022-08-25 ENCOUNTER — Other Ambulatory Visit: Payer: Self-pay | Admitting: Internal Medicine

## 2022-08-25 NOTE — Progress Notes (Signed)
[facility] Summary Report / Loop Recorder 

## 2022-09-01 ENCOUNTER — Other Ambulatory Visit: Payer: Self-pay | Admitting: Internal Medicine

## 2022-09-01 DIAGNOSIS — I480 Paroxysmal atrial fibrillation: Secondary | ICD-10-CM

## 2022-09-01 NOTE — Telephone Encounter (Signed)
Prescription refill request for Xarelto received.  Indication: a fib Last office visit: 07/04/22  Weight: 153 Age: 77 Scr: 1.1 (06/22/22) K[facility] CrCl:  54 mL/min

## 2022-09-18 LAB — CUP PACEART REMOTE DEVICE CHECK
Date Time Interrogation Session: 20240420230038
Implantable Pulse Generator Implant Date: 20201211

## 2022-09-21 ENCOUNTER — Other Ambulatory Visit: Payer: Self-pay | Admitting: Gastroenterology

## 2022-09-21 ENCOUNTER — Ambulatory Visit (INDEPENDENT_AMBULATORY_CARE_PROVIDER_SITE_OTHER): Payer: Medicare Other

## 2022-09-21 DIAGNOSIS — R55 Syncope and collapse: Secondary | ICD-10-CM | POA: Diagnosis not present

## 2022-09-25 ENCOUNTER — Encounter (HOSPITAL_COMMUNITY): Payer: Self-pay | Admitting: Gastroenterology

## 2022-09-25 NOTE — Progress Notes (Signed)
Attempted to obtain medical history via telephone, unable to reach at this time. HIPAA compliant voicemail message left requesting return call to pre surgical testing department. 

## 2022-09-27 ENCOUNTER — Ambulatory Visit (HOSPITAL_COMMUNITY)
Admission: RE | Admit: 2022-09-27 | Discharge: 2022-09-27 | Disposition: A | Discharge status: Home / Self Care | Payer: Medicare Other | Attending: Gastroenterology | Admitting: Gastroenterology

## 2022-09-27 ENCOUNTER — Ambulatory Visit (HOSPITAL_BASED_OUTPATIENT_CLINIC_OR_DEPARTMENT_OTHER): Payer: Medicare Other | Admitting: Anesthesiology

## 2022-09-27 ENCOUNTER — Encounter (HOSPITAL_COMMUNITY): Payer: Self-pay | Admitting: Gastroenterology

## 2022-09-27 ENCOUNTER — Ambulatory Visit (HOSPITAL_COMMUNITY): Payer: Medicare Other | Admitting: Anesthesiology

## 2022-09-27 ENCOUNTER — Other Ambulatory Visit: Payer: Self-pay

## 2022-09-27 ENCOUNTER — Encounter (HOSPITAL_COMMUNITY)
Admission: RE | Disposition: A | Discharge status: Home / Self Care | Payer: Self-pay | Source: Home / Self Care | Attending: Gastroenterology

## 2022-09-27 DIAGNOSIS — K635 Polyp of colon: Secondary | ICD-10-CM

## 2022-09-27 DIAGNOSIS — K648 Other hemorrhoids: Secondary | ICD-10-CM | POA: Diagnosis not present

## 2022-09-27 DIAGNOSIS — D123 Benign neoplasm of transverse colon: Secondary | ICD-10-CM | POA: Diagnosis not present

## 2022-09-27 DIAGNOSIS — Z09 Encounter for follow-up examination after completed treatment for conditions other than malignant neoplasm: Secondary | ICD-10-CM | POA: Diagnosis not present

## 2022-09-27 DIAGNOSIS — I10 Essential (primary) hypertension: Secondary | ICD-10-CM

## 2022-09-27 DIAGNOSIS — D122 Benign neoplasm of ascending colon: Secondary | ICD-10-CM | POA: Insufficient documentation

## 2022-09-27 DIAGNOSIS — K5730 Diverticulosis of large intestine without perforation or abscess without bleeding: Secondary | ICD-10-CM | POA: Insufficient documentation

## 2022-09-27 DIAGNOSIS — Z86010 Personal history of colonic polyps: Secondary | ICD-10-CM | POA: Insufficient documentation

## 2022-09-27 DIAGNOSIS — D126 Benign neoplasm of colon, unspecified: Secondary | ICD-10-CM

## 2022-09-27 DIAGNOSIS — Z1211 Encounter for screening for malignant neoplasm of colon: Secondary | ICD-10-CM | POA: Insufficient documentation

## 2022-09-27 DIAGNOSIS — D120 Benign neoplasm of cecum: Secondary | ICD-10-CM | POA: Insufficient documentation

## 2022-09-27 DIAGNOSIS — Z7901 Long term (current) use of anticoagulants: Secondary | ICD-10-CM | POA: Insufficient documentation

## 2022-09-27 DIAGNOSIS — I480 Paroxysmal atrial fibrillation: Secondary | ICD-10-CM

## 2022-09-27 DIAGNOSIS — Z79899 Other long term (current) drug therapy: Secondary | ICD-10-CM | POA: Insufficient documentation

## 2022-09-27 HISTORY — PX: POLYPECTOMY: SHX5525

## 2022-09-27 HISTORY — PX: COLONOSCOPY WITH PROPOFOL: SHX5780

## 2022-09-27 SURGERY — COLONOSCOPY WITH PROPOFOL
Anesthesia: Monitor Anesthesia Care | Laterality: Bilateral

## 2022-09-27 MED ORDER — SODIUM CHLORIDE 0.9 % IV SOLN: INTRAVENOUS | Status: DC

## 2022-09-27 MED ORDER — RIVAROXABAN 20 MG PO TABS
20.0000 mg | ORAL_TABLET | Freq: Every day | ORAL | 1 refills | Status: DC
Start: 2022-09-28 — End: 2023-03-05

## 2022-09-27 MED ORDER — PROPOFOL 500 MG/50ML IV EMUL
INTRAVENOUS | Status: AC
  Filled 2022-09-27: qty 50

## 2022-09-27 MED ORDER — PROPOFOL 10 MG/ML IV BOLUS
INTRAVENOUS | Status: DC | PRN
  Administered 2022-09-27: 20 mg via INTRAVENOUS
  Administered 2022-09-27: 10 mg via INTRAVENOUS
  Administered 2022-09-27 (×2): 20 mg via INTRAVENOUS

## 2022-09-27 MED ORDER — PHENYLEPHRINE 80 MCG/ML (10ML) SYRINGE FOR IV PUSH (FOR BLOOD PRESSURE SUPPORT)
PREFILLED_SYRINGE | INTRAVENOUS | Status: DC | PRN
  Administered 2022-09-27: 80 ug via INTRAVENOUS
  Administered 2022-09-27: 160 ug via INTRAVENOUS
  Administered 2022-09-27: 80 ug via INTRAVENOUS
  Administered 2022-09-27 (×2): 240 ug via INTRAVENOUS

## 2022-09-27 MED ORDER — PROPOFOL 1000 MG/100ML IV EMUL
INTRAVENOUS | Status: AC
  Filled 2022-09-27: qty 100

## 2022-09-27 MED ORDER — PROPOFOL 500 MG/50ML IV EMUL
INTRAVENOUS | Status: DC | PRN
  Administered 2022-09-27: 120 ug/kg/min via INTRAVENOUS

## 2022-09-27 MED ORDER — LACTATED RINGERS IV SOLN: INTRAVENOUS | Status: DC

## 2022-09-27 SURGICAL SUPPLY — 22 items

## 2022-09-27 NOTE — Discharge Instructions (Signed)

## 2022-09-27 NOTE — Transfer of Care (Signed)
Immediate Anesthesia Transfer of Care Note  Patient: OSLO HUNTSMAN  Procedure(s) Performed: COLONOSCOPY WITH PROPOFOL (Bilateral) POLYPECTOMY  Patient Location: PACU  Anesthesia Type:MAC  Level of Consciousness: sedated  Airway & Oxygen Therapy: Patient Spontanous Breathing and Patient connected to face mask oxygen  Post-op Assessment: Report given to RN and Post -op Vital signs reviewed and stable  Post vital signs: Reviewed and stable  Last Vitals:  Vitals Value Taken Time  BP    Temp    Pulse 37 09/27/22 1000  Resp 23 09/27/22 1000  SpO2 100 % 09/27/22 1000  Vitals shown include unvalidated device data.  Last Pain:  Vitals:   09/27/22 0833  TempSrc: Temporal  PainSc: 0-No pain         Complications: No notable events documented.

## 2022-09-27 NOTE — Anesthesia Postprocedure Evaluation (Signed)
Anesthesia Post Note  Patient: Billy Carpenter  Procedure(s) Performed: COLONOSCOPY WITH PROPOFOL (Bilateral) POLYPECTOMY     Patient location during evaluation: PACU Anesthesia Type: MAC Level of consciousness: awake and alert Pain management: pain level controlled Vital Signs Assessment: post-procedure vital signs reviewed and stable Respiratory status: spontaneous breathing Cardiovascular status: stable Anesthetic complications: no   No notable events documented.  Last Vitals:  Vitals:   09/27/22 1050 09/27/22 1103  BP: 108/62 124/62  Pulse: 70 69  Resp: 19 (!) 22  Temp:    SpO2: 97% 98%    Last Pain:  Vitals:   09/27/22 1103  TempSrc:   PainSc: 0-No pain                 Lewie Loron

## 2022-09-27 NOTE — Op Note (Signed)
[facility] Patient Name: Billy Carpenter Procedure Date: 09/27/2022 MRN: [id number] Attending MD: Willis Modena , MD, [id number] Date of Birth: July 28, 1945 CSN: [id number] Age: 77 Admit Type: Outpatient Procedure:                Colonoscopy Indications:              High risk colon cancer surveillance: Personal                            history of colonic polyps, Last colonoscopy: 2018 Providers:                Willis Modena, MD, Margaree Mackintosh, RN,                            Marja Kays, Technician Referring MD:             Willis Modena, MD Medicines:                Monitored Anesthesia Care Complications:            No immediate complications. Estimated Blood Loss:     Estimated blood loss: none. Procedure:                Pre-Anesthesia Assessment:                           - Prior to the procedure, a History and Physical                            was performed, and patient medications and                            allergies were reviewed. The patient's tolerance of                            previous anesthesia was also reviewed. The risks                            and benefits of the procedure and the sedation                            options and risks were discussed with the patient.                            All questions were answered, and informed consent                            was obtained. Prior Anticoagulants: The patient has                            taken Xarelto (rivaroxaban), last dose was 3 days                            prior to procedure. ASA Grade Assessment: III - A  patient with severe systemic disease. After                            reviewing the risks and benefits, the patient was                            deemed in satisfactory condition to undergo the                            procedure.                           After obtaining informed consent, the colonoscope                             was passed under direct vision. Throughout the                            procedure, the patient's blood pressure, pulse, and                            oxygen saturations were monitored continuously. The                            PCF-HQ190L ([id number]) Olympus colonoscope was                            introduced through the anus and advanced to the the                            cecum, identified by appendiceal orifice and                            ileocecal valve. The ileocecal valve, appendiceal                            orifice, and rectum were photographed. The entire                            colon was examined. The colonoscopy was performed                            without difficulty. The patient tolerated the                            procedure well. The quality of the bowel                            preparation was fair. Scope In: 9:23:06 AM Scope Out: 9:53:39 AM Scope Withdrawal Time: 0 hours 20 minutes 36 seconds  Total Procedure Duration: 0 hours 30 minutes 33 seconds  Findings:      The perianal and digital rectal examinations were normal.      Internal hemorrhoids were found during retroflexion. The hemorrhoids       were moderate.  Bowel prep diffusely fair; semisolid and viscous stool obscured some       views throughout colon; diminutive or subtle sessile polyps could easily       have been missed.      Multiple medium-mouthed diverticula were found in the sigmoid colon and       descending colon.      Three sessile polyps were found in the ascending colon and cecum. The       polyps were 2 to 4 mm in size. These polyps were removed with a cold       biopsy forceps. Resection and retrieval were complete.      Two sessile polyps were found in the transverse colon. The polyps were 5       to 6 mm in size. These polyps were removed with a hot snare. Resection       and retrieval were complete.      A 7 mm polyp was found in the recto-sigmoid colon. The polyp  was       sessile. The polyp was removed with a hot snare. Resection and retrieval       were complete.      Colon otherwise normal; no other polyps, masses, vascular ectasias, or       inflammatory changes were seen. Impression:               - Preparation of the colon was fair.                           - Internal hemorrhoids.                           - Diverticulosis in the sigmoid colon and in the                            descending colon.                           - Three 2 to 4 mm polyps in the ascending colon and                            in the cecum, removed with a cold biopsy forceps.                            Resected and retrieved.                           - Two 5 to 6 mm polyps in the transverse colon,                            removed with a hot snare. Resected and retrieved.                           - One 7 mm polyp at the recto-sigmoid colon,                            removed with a hot snare. Resected and retrieved.                           -  The examination was otherwise normal. Moderate Sedation:      None Recommendation:           - Patient has a contact number available for                            emergencies. The signs and symptoms of potential                            delayed complications were discussed with the                            patient. Return to normal activities tomorrow.                            Written discharge instructions were provided to the                            patient.                           - Discharge patient to home (ambulatory).                           - High fiber diet indefinitely.                           - Continue present medications.                           - Resume Xarelto (rivaroxaban) at prior dose                            tomorrow.                           - Await pathology results.                           - Repeat colonoscopy date to be determined after                            pending  pathology results are reviewed for                            surveillance based on pathology results.                           - Return to GI clinic PRN.                           - Return to referring physician as previously                            scheduled. Procedure Code(s):        --- Professional ---  16109, Colonoscopy, flexible; with removal of                            tumor(s), polyp(s), or other lesion(s) by snare                            technique                           45380, 59, Colonoscopy, flexible; with biopsy,                            single or multiple Diagnosis Code(s):        --- Professional ---                           Z86.010, Personal history of colonic polyps                           K64.8, Other hemorrhoids                           D12.2, Benign neoplasm of ascending colon                           D12.0, Benign neoplasm of cecum                           D12.3, Benign neoplasm of transverse colon (hepatic                            flexure or splenic flexure)                           D12.7, Benign neoplasm of rectosigmoid junction                           K57.30, Diverticulosis of large intestine without                            perforation or abscess without bleeding CPT copyright 2022 American Medical Association. All rights reserved. The codes documented in this report are preliminary and upon coder review may  be revised to meet current compliance requirements. Willis Modena, MD 09/27/2022 10:06:37 AM This report has been signed electronically. Number of Addenda: 0

## 2022-09-27 NOTE — H&P (Signed)
[facility] H/P Note  Chief Complaint: personal history of colonic polyps  HPI: Billy Carpenter is an 77 y.o. male.  Surveillance colonoscopy.  Asymptomatic.  Personal history of colonic polyps, last colonoscopy 2018.  Past Medical History:  Diagnosis Date   BPH (benign prostatic hyperplasia)    Carotid artery disease (HCC)    Left internal carotid artery spontaneous dissection in the past  //   Doppler, October, 2013, minimal plaque, 0-39% bilateral   Chronic low back pain 05/03/2017   CVA (cerebral infarction)    left insular cortex stroke with left internal carotid artery dissection and near occlusion   Dyslipidemia    Hematuria    Hypertension    Mitral regurgitation    mild, echo, March, 2010   Persistent atrial fibrillation (HCC)    Syncope    Syncope and collapse 12/21/2017    Past Surgical History:  Procedure Laterality Date   BLADDER STONE REMOVAL     IR ANGIO INTRA EXTRACRAN SEL COM CAROTID INNOMINATE BILAT MOD SED  05/16/2019   IR ANGIO VERTEBRAL SEL VERTEBRAL BILAT MOD SED  05/16/2019   IR US GUIDE VASC ACCESS RIGHT  05/16/2019   LEFT HEART CATH AND CORONARY ANGIOGRAPHY N/A 06/27/2017   Procedure: LEFT HEART CATH AND CORONARY ANGIOGRAPHY;  Surgeon: Lyn Records, MD;  Location: [facility] INVASIVE CV [facility];  Service: Cardiovascular;  Laterality: N/A;   PROSTATE SURGERY  2009   WRIST SURGERY     Right    Medications Prior to Admission  Medication Sig Dispense Refill   atorvastatin (LIPITOR) 20 MG tablet Take 1 tablet (20 mg total) by mouth daily. 90 tablet 3   diltiazem (CARDIZEM CD) 120 MG 24 hr capsule Take 1 capsule by mouth once daily 90 capsule 0   flecainide (TAMBOCOR) 50 MG tablet Take 1 tablet by mouth twice daily 180 tablet 3   naproxen sodium (ALEVE) 220 MG tablet Take 220 mg by mouth 2 (two) times daily as needed.     rivaroxaban (XARELTO) 20 MG TABS tablet TAKE 1 TABLET BY MOUTH ONCE DAILY WITH SUPPER 90 tablet 1    Allergies: No Known  Allergies  Family History  Problem Relation Age of Onset   Arrhythmia Mother    Hypertension Mother    Arthritis Mother    Other Father        tumor   Prostate cancer Father    CVA Neg Hx     Social History:  reports that he has never smoked. He has never used smokeless tobacco. He reports that he does not currently use alcohol. He reports that he does not use drugs.   ROS: As per HPI, all others negative   Blood pressure 138/84, pulse 78, temperature 97.9 F (36.6 C), temperature source Temporal, resp. rate 19, height 5\' 7"  (1.702 m), weight 70 kg, SpO2 100 %. General appearance: NAD, younger-appearing than stated age HEENT:  [place]/AT, anicteric NECK:  Supple CV:  Regular RESP:  No respiratory distress ABD:  Soft, non-tender NEURO:  A/O, no encephalopathy  No results found for this or any previous visit (from the past 48 hour(s)). No results found.  Assessment/Plan   Personal history of colonic polyps. Chronic anticoagulation, Xarelto, on hold. Colonoscopy today. Risks (bleeding, infection, bowel perforation that could require surgery, sedation-related changes in cardiopulmonary systems), benefits (identification and possible treatment of source of symptoms, exclusion of certain causes of symptoms), and alternatives (watchful waiting, radiographic imaging studies, empiric medical treatment) of colonoscopy were explained to  patient/family in detail and patient wishes to proceed.   Billy Carpenter 09/27/2022, 9:15 AM

## 2022-09-27 NOTE — Anesthesia Preprocedure Evaluation (Addendum)
Anesthesia Evaluation  Patient identified by MRN, date of birth, ID band Patient awake    Reviewed: Allergy & Precautions, NPO status , Patient's Chart, lab work & pertinent test results  Airway Mallampati: II  TM Distance: >3 FB Neck ROM: Full    Dental  (+) Dental Advisory Given   Pulmonary neg pulmonary ROS   Pulmonary exam normal breath sounds clear to auscultation       Cardiovascular hypertension, Pt. on medications + PND   Rhythm:Irregular Rate:Normal  Echo 2020  1. Left ventricular ejection fraction, by visual estimation, is 55 to 60%. The left ventricle has normal function. There is no left ventricular hypertrophy.   2. Left ventricular diastolic parameters are consistent with Grade I diastolic dysfunction (impaired relaxation).   3. The left ventricle has no regional wall motion abnormalities.   4. Global right ventricle has normal systolic function.The right ventricular size is mildly enlarged. No increase in right ventricular wall thickness.   5. Left atrial size was mildly dilated.   6. Right atrial size was mildly dilated.   7. Mild mitral annular calcification.   8. The mitral valve is normal in structure. Trace mitral valve regurgitation. No evidence of mitral stenosis.   9. The tricuspid valve is normal in structure. Tricuspid valve regurgitation is trivial.  10. The aortic valve is tricuspid. Aortic valve regurgitation is trivial. No evidence of aortic valve sclerosis or stenosis.  11. There is mild to moderate dilatation of the aortic root measuring 44 mm.  12. The tricuspid regurgitant velocity is 2.33 m/s, and with an assumed right atrial pressure of 3 mmHg, the estimated right ventricular systolic pressure is normal at 24.7 mmHg.  13. The inferior vena cava is normal in size with greater than 50% respiratory variability, suggesting right atrial pressure of 3 mmHg.      Neuro/Psych negative neurological ROS      GI/Hepatic negative GI ROS, Neg liver ROS,,,  Endo/Other  negative endocrine ROS    Renal/GU negative Renal ROS     Musculoskeletal  (+) Arthritis ,    Abdominal   Peds  Hematology negative hematology ROS (+)   Anesthesia Other Findings   Reproductive/Obstetrics                             Anesthesia Physical Anesthesia Plan  ASA: 3  Anesthesia Plan: MAC   Post-op Pain Management: Minimal or no pain anticipated   Induction: Intravenous  PONV Risk Score and Plan: 1 and Propofol infusion, TIVA and Treatment may vary due to age or medical condition  Airway Management Planned:   Additional Equipment:   Intra-op Plan:   Post-operative Plan:   Informed Consent: I have reviewed the patients History and Physical, chart, labs and discussed the procedure including the risks, benefits and alternatives for the proposed anesthesia with the patient or authorized representative who has indicated his/her understanding and acceptance.     Dental advisory given  Plan Discussed with: CRNA  Anesthesia Plan Comments:         Anesthesia Quick Evaluation

## 2022-09-28 LAB — SURGICAL PATHOLOGY

## 2022-09-30 ENCOUNTER — Other Ambulatory Visit: Payer: Self-pay | Admitting: Internal Medicine

## 2022-09-30 ENCOUNTER — Other Ambulatory Visit: Payer: Self-pay | Admitting: Cardiology

## 2022-09-30 MED ORDER — DILTIAZEM HCL ER COATED BEADS 120 MG PO CP24: 120.0000 mg | ORAL_CAPSULE | Freq: Every day | ORAL | 0 refills | Status: DC

## 2022-09-30 NOTE — Progress Notes (Signed)
Patient called in requesting refill on diltiazem.  Refill was sent to pharmacy of choice.

## 2022-10-02 ENCOUNTER — Encounter (HOSPITAL_COMMUNITY): Payer: Self-pay | Admitting: Gastroenterology

## 2022-10-02 NOTE — Progress Notes (Signed)
[facility] Summary Report / Loop Recorder 

## 2022-10-03 ENCOUNTER — Encounter: Payer: Self-pay | Admitting: Internal Medicine

## 2022-10-03 MED ORDER — DILTIAZEM HCL ER COATED BEADS 120 MG PO CP24: 120.0000 mg | ORAL_CAPSULE | Freq: Every day | ORAL | 0 refills | Status: DC

## 2022-10-17 NOTE — Progress Notes (Signed)
[facility] Summary Report / Loop Recorder 

## 2022-10-20 LAB — CUP PACEART REMOTE DEVICE CHECK
Date Time Interrogation Session: 20240523230109
Implantable Pulse Generator Implant Date: 20201211

## 2022-10-24 ENCOUNTER — Ambulatory Visit (INDEPENDENT_AMBULATORY_CARE_PROVIDER_SITE_OTHER): Payer: Medicare Other

## 2022-10-24 DIAGNOSIS — R55 Syncope and collapse: Secondary | ICD-10-CM | POA: Diagnosis not present

## 2022-11-17 ENCOUNTER — Telehealth: Payer: Self-pay

## 2022-11-17 NOTE — Telephone Encounter (Signed)
Went over patient AF burden for the April report, informed patient that Dr. Graciela Husbands had not yet reviewed the May report and this is likely why he cannot see the results. Patient appreciative of call back

## 2022-11-17 NOTE — Telephone Encounter (Signed)
Pt would like for someone to call him about his last remote check. He wants to discuss his A-fib. His phone number is [phone].

## 2022-11-19 ENCOUNTER — Encounter: Payer: Self-pay | Admitting: Internal Medicine

## 2022-11-20 NOTE — Progress Notes (Signed)
[facility] Summary Report / Loop Recorder 

## 2022-11-22 LAB — CUP PACEART REMOTE DEVICE CHECK
Date Time Interrogation Session: 20240625230126
Implantable Pulse Generator Implant Date: 20201211

## 2022-11-27 ENCOUNTER — Ambulatory Visit (INDEPENDENT_AMBULATORY_CARE_PROVIDER_SITE_OTHER): Payer: Medicare Other

## 2022-11-27 DIAGNOSIS — R55 Syncope and collapse: Secondary | ICD-10-CM

## 2022-12-18 NOTE — Progress Notes (Signed)
[facility] Summary Report / Loop Recorder 

## 2022-12-26 ENCOUNTER — Other Ambulatory Visit: Payer: Self-pay | Admitting: Family Medicine

## 2022-12-26 DIAGNOSIS — R918 Other nonspecific abnormal finding of lung field: Secondary | ICD-10-CM

## 2023-01-01 ENCOUNTER — Ambulatory Visit: Payer: Medicare Other

## 2023-01-01 DIAGNOSIS — R55 Syncope and collapse: Secondary | ICD-10-CM

## 2023-01-16 NOTE — Progress Notes (Signed)
[facility] Summary Report / Loop Recorder 

## 2023-02-05 ENCOUNTER — Ambulatory Visit: Payer: Medicare Other

## 2023-02-05 DIAGNOSIS — R55 Syncope and collapse: Secondary | ICD-10-CM

## 2023-02-05 LAB — CUP PACEART REMOTE DEVICE CHECK
Date Time Interrogation Session: 20240906230443
Implantable Pulse Generator Implant Date: 20201211

## 2023-02-22 NOTE — Progress Notes (Signed)
[facility] Summary Report / Loop Recorder 

## 2023-03-01 ENCOUNTER — Encounter: Payer: Self-pay | Admitting: Internal Medicine

## 2023-03-01 MED ORDER — DILTIAZEM HCL ER COATED BEADS 120 MG PO CP24: 120.0000 mg | ORAL_CAPSULE | Freq: Every day | ORAL | 1 refills | Status: DC

## 2023-03-02 ENCOUNTER — Encounter: Payer: Self-pay | Admitting: Internal Medicine

## 2023-03-02 DIAGNOSIS — I480 Paroxysmal atrial fibrillation: Secondary | ICD-10-CM

## 2023-03-05 MED ORDER — RIVAROXABAN 20 MG PO TABS
20.0000 mg | ORAL_TABLET | Freq: Every day | ORAL | 1 refills | Status: DC
Start: 2023-03-05 — End: 2023-03-05

## 2023-03-05 MED ORDER — RIVAROXABAN 20 MG PO TABS
20.0000 mg | ORAL_TABLET | Freq: Every day | ORAL | 1 refills | Status: DC
Start: 2023-03-05 — End: 2023-08-13

## 2023-03-05 NOTE — Addendum Note (Signed)
Addended by: Betsy Coder B on: 03/05/2023 12:01 PM   Modules accepted: Orders

## 2023-03-05 NOTE — Telephone Encounter (Signed)
Prescription refill request for Xarelto received.  Indication: Afib  Last office visit: 07/04/22 Billy Carpenter)  Weight: 70kg Age: 77 Scr:0.97 (07/04/22)  CrCl: 62.54ml/min  Appropriate dose. Refill sent.

## 2023-03-12 ENCOUNTER — Ambulatory Visit (INDEPENDENT_AMBULATORY_CARE_PROVIDER_SITE_OTHER): Payer: Medicare Other

## 2023-03-12 DIAGNOSIS — I480 Paroxysmal atrial fibrillation: Secondary | ICD-10-CM

## 2023-03-12 DIAGNOSIS — R55 Syncope and collapse: Secondary | ICD-10-CM

## 2023-03-13 LAB — CUP PACEART REMOTE DEVICE CHECK
Date Time Interrogation Session: 20241014052017
Implantable Pulse Generator Implant Date: 20201211

## 2023-03-28 NOTE — Progress Notes (Signed)
[facility] Summary Report / Loop Recorder 

## 2023-04-16 ENCOUNTER — Ambulatory Visit (INDEPENDENT_AMBULATORY_CARE_PROVIDER_SITE_OTHER): Payer: Medicare Other

## 2023-04-16 DIAGNOSIS — R55 Syncope and collapse: Secondary | ICD-10-CM

## 2023-04-17 LAB — CUP PACEART REMOTE DEVICE CHECK
Date Time Interrogation Session: 20241117230321
Implantable Pulse Generator Implant Date: 20201211

## 2023-04-18 ENCOUNTER — Encounter: Payer: Self-pay | Admitting: Internal Medicine

## 2023-05-11 NOTE — Addendum Note (Signed)
Addended by: Geralyn Flash D on: 05/11/2023 10:35 AM   Modules accepted: Orders

## 2023-05-11 NOTE — Progress Notes (Signed)
[facility] Summary Report / Loop Recorder 

## 2023-05-21 ENCOUNTER — Ambulatory Visit (INDEPENDENT_AMBULATORY_CARE_PROVIDER_SITE_OTHER): Payer: Medicare Other

## 2023-05-21 DIAGNOSIS — R55 Syncope and collapse: Secondary | ICD-10-CM

## 2023-05-22 LAB — CUP PACEART REMOTE DEVICE CHECK
Date Time Interrogation Session: 20241222230058
Implantable Pulse Generator Implant Date: 20201211

## 2023-05-25 ENCOUNTER — Encounter: Payer: Self-pay | Admitting: Internal Medicine

## 2023-06-19 ENCOUNTER — Telehealth: Payer: Self-pay | Admitting: *Deleted

## 2023-06-19 ENCOUNTER — Other Ambulatory Visit: Payer: Self-pay | Admitting: Neurosurgery

## 2023-06-19 NOTE — Telephone Encounter (Signed)
   Pre-operative Risk Assessment    Patient Name: Billy Carpenter  DOB: 12/30/1945 MRN: [id number]   Date of last office visit: 07/04/22 DR. KLEIN Date of next office visit: 06/26/23 DR. [facility]   Request for Surgical Clearance    Procedure:  LUMBAR FUSION  Date of Surgery:  Clearance 07/26/23                                Surgeon:  DR. Tressie Stalker Surgeon's Group or Practice Name:  [facility] Phone number:  [phone] EXT [id number] NIKKI Fax number:  [phone]   Type of Clearance Requested:   - Medical  - Pharmacy:  Hold Rivaroxaban (Xarelto)     Type of Anesthesia:  General    Additional requests/questions:    Elpidio Anis   06/19/2023, 6:14 PM

## 2023-06-20 NOTE — Telephone Encounter (Signed)
Patient with diagnosis of PAF on Xarelto for anticoagulation. Has appt with Dr Graciela Husbands on 06/26/23  Procedure:  LUMBAR FUSION  Date of procedure: 07/26/23   CHA2DS2-VASc Score = 7  This indicates a 11.2% annual risk of stroke. The patient's score is based upon: CHF History: 1 HTN History: 1 Diabetes History: 0 Stroke History: 2 Vascular Disease History: 1 Age Score: 2 Gender Score: 0   CrCl needs labs Platelet count needs labs  Patient needs lab work updated before clearance can be given. Per protocol, typically recommend 3 day Xarelto hold prior to spinal procedure but will wait until patient updates lab work and is seen by Dr Graciela Husbands since patient has history of CVA   **This guidance is not considered finalized until pre-operative APP has relayed final recommendations.**

## 2023-06-20 NOTE — Telephone Encounter (Signed)
   Name: Billy Carpenter  DOB: [birth year]  MRN: [id number]  Primary Cardiologist: None  Chart reviewed as part of pre-operative protocol coverage. The patient has an upcoming visit scheduled with Dr. Graciela Husbands on 06/26/2023  at which time clearance can be addressed in case there are any issues that would impact surgical recommendations.  I added preop FYI to appointment note so that provider is aware to address at time of outpatient visit.  Per office protocol the cardiology provider should forward their finalized clearance decision and recommendations regarding antiplatelet therapy to the requesting party below.    This message will also be routed to pharmacy pool  for input on holding Xarelto as requested below so that this information is available to the clearing provider at time of patient's appointment.   I will route this message as FYI to requesting party and remove this message from the preop box as separate preop APP input not needed at this time.   Please call with any questions.  Napoleon Form, Leodis Rains, NP  06/20/2023, 8:28 AM

## 2023-06-21 ENCOUNTER — Other Ambulatory Visit (HOSPITAL_COMMUNITY): Payer: Self-pay | Admitting: Neurosurgery

## 2023-06-21 DIAGNOSIS — M542 Cervicalgia: Secondary | ICD-10-CM

## 2023-06-25 ENCOUNTER — Ambulatory Visit: Payer: Medicare Other

## 2023-06-25 DIAGNOSIS — R55 Syncope and collapse: Secondary | ICD-10-CM

## 2023-06-25 LAB — CUP PACEART REMOTE DEVICE CHECK
Date Time Interrogation Session: 20250126230100
Implantable Pulse Generator Implant Date: 20201211

## 2023-06-26 ENCOUNTER — Encounter: Payer: Self-pay | Admitting: Internal Medicine

## 2023-06-26 ENCOUNTER — Ambulatory Visit: Payer: Medicare Other | Attending: Internal Medicine | Admitting: Internal Medicine

## 2023-06-26 VITALS — BP 136/84 | HR 79 | Ht 67.0 in | Wt 151.6 lb

## 2023-06-26 DIAGNOSIS — R42 Dizziness and giddiness: Secondary | ICD-10-CM

## 2023-06-26 DIAGNOSIS — Z79899 Other long term (current) drug therapy: Secondary | ICD-10-CM

## 2023-06-26 DIAGNOSIS — I4729 Other ventricular tachycardia: Secondary | ICD-10-CM

## 2023-06-26 DIAGNOSIS — I480 Paroxysmal atrial fibrillation: Secondary | ICD-10-CM | POA: Diagnosis not present

## 2023-06-26 DIAGNOSIS — R55 Syncope and collapse: Secondary | ICD-10-CM

## 2023-06-26 NOTE — Patient Instructions (Addendum)
Medication Instructions:  Your physician recommends that you continue on your current medications as directed. Please refer to the Current Medication list given to you today.  *If you need a refill on your cardiac medications before your next appointment, please call your pharmacy*   Lab Work: None ordered.  If you have labs (blood work) drawn today and your tests are completely normal, you will receive your results only by: MyChart Message (if you have MyChart) OR A paper copy in the mail If you have any lab test that is abnormal or we need to change your treatment, we will call you to review the results.   Testing/Procedures: None ordered.    Follow-Up: At [facility], you and your health needs are our priority.  As part of our continuing mission to provide you with exceptional heart care, we have created designated Provider Care Teams.  These Care Teams include your primary Cardiologist (physician) and Advanced Practice Providers (APPs -  Physician Assistants and Nurse Practitioners) who all work together to provide you with the care you need, when you need it.  We recommend signing up for the patient portal called "MyChart".  Sign up information is provided on this After Visit Summary.  MyChart is used to connect with patients for Virtual Visits (Telemedicine).  Patients are able to view lab/test results, encounter notes, upcoming appointments, etc.  Non-urgent messages can be sent to your provider as well.   To learn more about what you can do with MyChart, go to [email or web address].    Your next appointment:   3-4 months with Dr Nelly Laurence as scheduled

## 2023-06-26 NOTE — Progress Notes (Signed)
Because go to [place] [facility] so when she gets there      Patient Care Team: Dois Davenport, MD as PCP - General (Family Medicine) Duke Salvia, MD as PCP - Electrophysiology (Cardiology) Sheran Luz, MD as Consulting Physician (Physical Medicine and Rehabilitation)   HPI  Billy Carpenter is a 78 y.o. male seen in  follow-up for recurrent syncope,  orthostatic hypotension  and nonsustained ventricular tachycardia. He is s/p linq implant. Atrial fibrillation treated with flecainide and anticoagulated with Xarelto.  No clinical bleeding   Has a history of a stroke and a remote carotid artery dissection.   Thromboembolic risk factors ( age -40, HTN-1, TIA/CVA-2, Vasc disease -1, CHF-1) for a CHADSVASc Score of >=7   He is also been seen by neurology.  Not withstanding negative EEG he was put on Keppra because of the concern that a seizure following his stroke could have been responsible for his syncope.  Subsequently stopped The patient denies chest pain, shortness of breath, nocturnal dyspnea, orthopnea or peripheral edema.  There have been no palpitations or syncope.  Episodes of presyncope unassoc w palpitations  Impending neurosurgical procedure for pinched nerves and foot drop    .   Date Cr K Hgb  12/20 1.07 3.9 16.6  1/22  0.97 4.1 15.9  1/24 1.1 4.4         DATE TEST EF    10/15 Echo  60-65%    9//18 Echo   30-35 % Mild RV dysfn  1/19 LHC  50 % Normal CA  1/19 Echo  45-50%    12/20 Echo  55-60%         DATE PR interval QRSduration Dose  6/21 210 86 75  6/22  180 84 50  1/24 210 84 50  1/25 242 90 50    Past Medical History:  Diagnosis Date   BPH (benign prostatic hyperplasia)    Carotid artery disease (HCC)    Left internal carotid artery spontaneous dissection in the past  //   Doppler, October, 2013, minimal plaque, 0-39% bilateral   Chronic low back pain 05/03/2017   CVA (cerebral infarction)    left insular cortex stroke with left internal carotid  artery dissection and near occlusion   Dyslipidemia    Hematuria    Hypertension    Mitral regurgitation    mild, echo, March, 2010   Persistent atrial fibrillation (HCC)    Syncope    Syncope and collapse 12/21/2017    Past Surgical History:  Procedure Laterality Date   BLADDER STONE REMOVAL     COLONOSCOPY WITH PROPOFOL Bilateral 09/27/2022   Procedure: COLONOSCOPY WITH PROPOFOL;  Surgeon: Willis Modena, MD;  Location: [facility];  Service: Gastroenterology;  Laterality: Bilateral;   IR ANGIO INTRA EXTRACRAN SEL COM CAROTID INNOMINATE BILAT MOD SED  05/16/2019   IR ANGIO VERTEBRAL SEL VERTEBRAL BILAT MOD SED  05/16/2019   IR US GUIDE VASC ACCESS RIGHT  05/16/2019   LEFT HEART CATH AND CORONARY ANGIOGRAPHY N/A 06/27/2017   Procedure: LEFT HEART CATH AND CORONARY ANGIOGRAPHY;  Surgeon: Lyn Records, MD;  Location: [facility];  Service: Cardiovascular;  Laterality: N/A;   POLYPECTOMY  09/27/2022   Procedure: POLYPECTOMY;  Surgeon: Willis Modena, MD;  Location: [facility];  Service: Gastroenterology;;   PROSTATE SURGERY  2009   WRIST SURGERY     Right    Current Meds  Medication Sig   atorvastatin (LIPITOR) 20 MG tablet Take 1 tablet (  20 mg total) by mouth daily.   Cholecalciferol (VITAMIN D3) 50 MCG (2000 UT) CAPS Take 2 capsules by mouth daily.   diltiazem (CARDIZEM CD) 120 MG 24 hr capsule Take 1 capsule (120 mg total) by mouth daily.   flecainide (TAMBOCOR) 50 MG tablet Take 1 tablet by mouth twice daily   magnesium gluconate (MAGONATE) 500 MG tablet Take 500 mg by mouth daily.   naproxen sodium (ALEVE) 220 MG tablet Take 220 mg by mouth 2 (two) times daily as needed.   rivaroxaban (XARELTO) 20 MG TABS tablet Take 1 tablet (20 mg total) by mouth daily with supper.   No Known Allergies  .    Physical Exam: BP (!) 137/91 Comment: standing @ 3 mins  Pulse 79   Ht 5\' 7"  (1.702 m)   Wt 151 lb 9.6 oz (68.8 kg)   SpO2 96%   BMI 23.74 kg/m  Well developed and  nourished in no acute distress HENT normal Neck supple with JVP-  flat  Clear Regular rate and rhythm, no murmurs or gallops Abd-soft with active BS No Clubbing cyanosis edema Skin-warm and dry A & Oriented  Grossly normal sensory and motor function    Assessment and  Plan:  Syncope abrupt onset/offset-recurrent   Orthostatic lightheadedness-    Atrial fibrillation-paroxysmal   Prior stroke   Cardiomyopathy-  nonischemic-mild   ventricular tachycardia-nonsustained  No interval syncope.  Significant increase in his atrial fibrillation.  Often times it seems to be below detection on his device.  Not particularly symptomatic.  However, will refer him for consideration of catheter ablation as his flecainide dose is limited by conduction system slowing.  So based on EAST AF-Net, would favor rhythm control and given his relatively spry vigor at not withstanding his age have discussed with him about considering catheter ablation.  He is amenable and we will set this up following his back surgery  for back surgery.  Cardiac function is outstanding.  Should be an acceptable risk.  Prior stroke was ascribed to carotid dissection so not related to his atrial fibrillation.  This will impact recommendations related to holding of his anticoagulation prior to the surgery

## 2023-06-27 ENCOUNTER — Encounter: Payer: Self-pay | Admitting: Internal Medicine

## 2023-06-29 ENCOUNTER — Ambulatory Visit (HOSPITAL_COMMUNITY)
Admission: RE | Admit: 2023-06-29 | Discharge: 2023-06-29 | Disposition: A | Discharge status: Home / Self Care | Payer: Medicare Other | Source: Ambulatory Visit | Attending: Neurosurgery | Admitting: Neurosurgery

## 2023-06-29 DIAGNOSIS — M542 Cervicalgia: Secondary | ICD-10-CM | POA: Insufficient documentation

## 2023-06-29 NOTE — Progress Notes (Signed)
 [facility] Summary Report / Loop Recorder

## 2023-06-29 NOTE — Addendum Note (Signed)
Addended by: Geralyn Flash D on: 06/29/2023 10:49 AM   Modules accepted: Orders

## 2023-07-02 ENCOUNTER — Other Ambulatory Visit: Payer: Self-pay | Admitting: Neurosurgery

## 2023-07-09 ENCOUNTER — Other Ambulatory Visit: Payer: Self-pay | Admitting: Student

## 2023-07-09 DIAGNOSIS — I6502 Occlusion and stenosis of left vertebral artery: Secondary | ICD-10-CM

## 2023-07-11 ENCOUNTER — Encounter (HOSPITAL_COMMUNITY): Payer: Self-pay

## 2023-07-11 ENCOUNTER — Encounter: Payer: Self-pay | Admitting: Internal Medicine

## 2023-07-11 NOTE — Progress Notes (Signed)
PERIOPERATIVE PRESCRIPTION FOR IMPLANTED CARDIAC DEVICE PROGRAMMING  Patient Information: Name:  Billy Carpenter  DOB:  08-03-1945  MRN:  [id number]  Planned Procedure:  PLIF Lumbar 4 - Lumbar 5  Surgeon:  Dr. Peggye Ley  Date of Procedure:  07/26/2023  Cautery will be used.  Position during surgery:  Prone   Device Information:  Clinic EP Physician:  Sherryl Manges, MD   Device Type:  Medtronic implantable loop recorder Manufacturer and Phone #:  Medtronic: [phone] Pacemaker Dependent?:   Loop recorder is a monitor only  Electrophysiologist's Recommendations:  Patient has an implantable loop recorder.  No action needed.  Please call if any questions.  Device clinic [phone].  Per Device Clinic Standing Orders, [name], RN  11:56 AM 07/11/2023

## 2023-07-11 NOTE — Pre-Procedure Instructions (Signed)
Surgical Instructions   Your procedure is scheduled on July 26, 2023. Report to [facility] Main Entrance "A" at 5:30 A.M., then check in with the Admitting office. Any questions or running late day of surgery: call [phone]  Questions prior to your surgery date: call [phone], Monday-Friday, 8am-4pm. If you experience any cold or flu symptoms such as cough, fever, chills, shortness of breath, etc. between now and your scheduled surgery, please notify us at the above number.     Remember:  Do not eat or drink after midnight the night before your surgery    Take these medicines the morning of surgery with A SIP OF WATER: atorvastatin (LIPITOR)  diltiazem (CARDIZEM CD)  flecainide (TAMBOCOR)  TIADYLT    STOP taking your rivaroxaban (XARELTO) three days prior to surgery. Your last dose will be February 23rd.   One week prior to surgery, STOP taking any Aspirin (unless otherwise instructed by your surgeon) Aleve, Naproxen, Ibuprofen, Motrin, Advil, Goody's, BC's, all herbal medications, fish oil, and non-prescription vitamins.                     Do NOT Smoke (Tobacco/Vaping) for 24 hours prior to your procedure.  If you use a CPAP at night, you may bring your mask/headgear for your overnight stay.   You will be asked to remove any contacts, glasses, piercing's, hearing aid's, dentures/partials prior to surgery. Please bring cases for these items if needed.    Patients discharged the day of surgery will not be allowed to drive home, and someone needs to stay with them for 24 hours.  SURGICAL WAITING ROOM VISITATION Patients may have no more than 2 support people in the waiting area - these visitors may rotate.   Pre-op nurse will coordinate an appropriate time for 1 ADULT support person, who may not rotate, to accompany patient in pre-op.  Children under the age of 70 must have an adult with them who is not the patient and must remain in the main waiting area with an  adult.  If the patient needs to stay at the hospital during part of their recovery, the visitor guidelines for inpatient rooms apply.  Please refer to the [facility] website for the visitor guidelines for any additional information.   If you received a COVID test during your pre-op visit  it is requested that you wear a mask when out in public, stay away from anyone that may not be feeling well and notify your surgeon if you develop symptoms. If you have been in contact with anyone that has tested positive in the last 10 days please notify you surgeon.      Pre-operative 5 CHG Bathing Instructions   You can play a key role in reducing the risk of infection after surgery. Your skin needs to be as free of germs as possible. You can reduce the number of germs on your skin by washing with CHG (chlorhexidine gluconate) soap before surgery. CHG is an antiseptic soap that kills germs and continues to kill germs even after washing.   DO NOT use if you have an allergy to chlorhexidine/CHG or antibacterial soaps. If your skin becomes reddened or irritated, stop using the CHG and notify one of our RNs at [phone].   Please shower with the CHG soap starting 4 days before surgery using the following schedule:     Please keep in mind the following:  DO NOT shave, including legs and underarms, starting the day of your  first shower.   You may shave your face at any point before/day of surgery.  Place clean sheets on your bed the day you start using CHG soap. Use a clean washcloth (not used since being washed) for each shower. DO NOT sleep with pets once you start using the CHG.   CHG Shower Instructions:  Wash your face and private area with normal soap. If you choose to wash your hair, wash first with your normal shampoo.  After you use shampoo/soap, rinse your hair and body thoroughly to remove shampoo/soap residue.  Turn the water OFF and apply about 3 tablespoons (45 ml) of CHG soap to a  CLEAN washcloth.  Apply CHG soap ONLY FROM YOUR NECK DOWN TO YOUR TOES (washing for 3-5 minutes)  DO NOT use CHG soap on face, private areas, open wounds, or sores.  Pay special attention to the area where your surgery is being performed.  If you are having back surgery, having someone wash your back for you may be helpful. Wait 2 minutes after CHG soap is applied, then you may rinse off the CHG soap.  Pat dry with a clean towel  Put on clean clothes/pajamas   If you choose to wear lotion, please use ONLY the CHG-compatible lotions that are listed below.  Additional instructions for the day of surgery: DO NOT APPLY any lotions, deodorants, cologne, or perfumes.   Do not bring valuables to the hospital. [facility] is not responsible for any belongings/valuables. Do not wear nail polish, gel polish, artificial nails, or any other type of covering on natural nails (fingers and toes) Do not wear jewelry or makeup Put on clean/comfortable clothes.  Please brush your teeth.  Ask your nurse before applying any prescription medications to the skin.     CHG Compatible Lotions   Aveeno Moisturizing lotion  Cetaphil Moisturizing Cream  Cetaphil Moisturizing Lotion  Clairol Herbal Essence Moisturizing Lotion, Dry Skin  Clairol Herbal Essence Moisturizing Lotion, Extra Dry Skin  Clairol Herbal Essence Moisturizing Lotion, Normal Skin  Curel Age Defying Therapeutic Moisturizing Lotion with Alpha Hydroxy  Curel Extreme Care Body Lotion  Curel Soothing Hands Moisturizing Hand Lotion  Curel Therapeutic Moisturizing Cream, Fragrance-Free  Curel Therapeutic Moisturizing Lotion, Fragrance-Free  Curel Therapeutic Moisturizing Lotion, Original Formula  Eucerin Daily Replenishing Lotion  Eucerin Dry Skin Therapy Plus Alpha Hydroxy Crme  Eucerin Dry Skin Therapy Plus Alpha Hydroxy Lotion  Eucerin Original Crme  Eucerin Original Lotion  Eucerin Plus Crme Eucerin Plus Lotion  Eucerin TriLipid  Replenishing Lotion  Keri Anti-Bacterial Hand Lotion  Keri Deep Conditioning Original Lotion Dry Skin Formula Softly Scented  Keri Deep Conditioning Original Lotion, Fragrance Free Sensitive Skin Formula  Keri Lotion Fast Absorbing Fragrance Free Sensitive Skin Formula  Keri Lotion Fast Absorbing Softly Scented Dry Skin Formula  Keri Original Lotion  Keri Skin Renewal Lotion Keri Silky Smooth Lotion  Keri Silky Smooth Sensitive Skin Lotion  Nivea Body Creamy Conditioning Oil  Nivea Body Extra Enriched Lotion  Nivea Body Original Lotion  Nivea Body Sheer Moisturizing Lotion Nivea Crme  Nivea Skin Firming Lotion  NutraDerm 30 Skin Lotion  NutraDerm Skin Lotion  NutraDerm Therapeutic Skin Cream  NutraDerm Therapeutic Skin Lotion  ProShield Protective Hand Cream  Provon moisturizing lotion  Please read over the following fact sheets that you were given.

## 2023-07-12 ENCOUNTER — Other Ambulatory Visit: Payer: Self-pay

## 2023-07-12 ENCOUNTER — Encounter (HOSPITAL_COMMUNITY): Payer: Self-pay

## 2023-07-12 ENCOUNTER — Encounter (HOSPITAL_COMMUNITY)
Admission: RE | Admit: 2023-07-12 | Discharge: 2023-07-12 | Disposition: A | Discharge status: Home / Self Care | Payer: Medicare Other | Source: Ambulatory Visit | Attending: Neurosurgery | Admitting: Neurosurgery

## 2023-07-12 VITALS — BP 137/94 | HR 82 | Temp 97.8°F | Resp 17 | Ht 67.0 in | Wt 152.7 lb

## 2023-07-12 DIAGNOSIS — I4819 Other persistent atrial fibrillation: Secondary | ICD-10-CM | POA: Insufficient documentation

## 2023-07-12 DIAGNOSIS — G450 Vertebro-basilar artery syndrome: Secondary | ICD-10-CM | POA: Diagnosis not present

## 2023-07-12 DIAGNOSIS — I10 Essential (primary) hypertension: Secondary | ICD-10-CM | POA: Insufficient documentation

## 2023-07-12 DIAGNOSIS — Z01818 Encounter for other preprocedural examination: Secondary | ICD-10-CM

## 2023-07-12 DIAGNOSIS — E785 Hyperlipidemia, unspecified: Secondary | ICD-10-CM | POA: Insufficient documentation

## 2023-07-12 DIAGNOSIS — I2510 Atherosclerotic heart disease of native coronary artery without angina pectoris: Secondary | ICD-10-CM | POA: Diagnosis not present

## 2023-07-12 DIAGNOSIS — Z01812 Encounter for preprocedural laboratory examination: Secondary | ICD-10-CM | POA: Insufficient documentation

## 2023-07-12 DIAGNOSIS — M4319 Spondylolisthesis, multiple sites in spine: Secondary | ICD-10-CM | POA: Insufficient documentation

## 2023-07-12 DIAGNOSIS — I083 Combined rheumatic disorders of mitral, aortic and tricuspid valves: Secondary | ICD-10-CM | POA: Diagnosis not present

## 2023-07-12 DIAGNOSIS — Z8673 Personal history of transient ischemic attack (TIA), and cerebral infarction without residual deficits: Secondary | ICD-10-CM | POA: Insufficient documentation

## 2023-07-12 DIAGNOSIS — R42 Dizziness and giddiness: Secondary | ICD-10-CM

## 2023-07-12 HISTORY — DX: Unspecified osteoarthritis, unspecified site: M19.90

## 2023-07-12 HISTORY — DX: Cerebral infarction, unspecified: I63.9

## 2023-07-12 HISTORY — DX: Peripheral vascular disease, unspecified: I73.9

## 2023-07-12 HISTORY — DX: Personal history of urinary calculi: Z87.442

## 2023-07-12 HISTORY — DX: Cardiac arrhythmia, unspecified: I49.9

## 2023-07-12 LAB — TYPE AND SCREEN
ABO/RH(D): O POS
Antibody Screen: NEGATIVE

## 2023-07-12 LAB — BASIC METABOLIC PANEL
Anion gap: 3 — ABNORMAL LOW (ref 5–15)
BUN: 10 mg/dL (ref 8–23)
CO2: 29 mmol/L (ref 22–32)
Calcium: 9.1 mg/dL (ref 8.9–10.3)
Chloride: 106 mmol/L (ref 98–111)
Creatinine, Ser: 1.06 mg/dL (ref 0.61–1.24)
GFR, Estimated: >60 mL/min (ref >60)
Glucose, Bld: 116 mg/dL — ABNORMAL HIGH (ref 70–99)
Potassium: 4.6 mmol/L (ref 3.5–5.1)
Sodium: 138 mmol/L (ref 135–145)

## 2023-07-12 LAB — CBC
HCT: 44.8 % (ref 39.0–52.0)
Hemoglobin: 15.7 g/dL (ref 13.0–17.0)
MCH: 32.8 pg (ref 26.0–34.0)
MCHC: 35 g/dL (ref 30.0–36.0)
MCV: 93.5 fL (ref 80.0–100.0)
Platelets: 193 10*3/uL (ref 150–400)
RBC: 4.79 MIL/uL (ref 4.22–5.81)
RDW: 13.2 % (ref 11.5–15.5)
WBC: 7 10*3/uL (ref 4.0–10.5)
nRBC: 0 % (ref 0.0–0.2)

## 2023-07-12 LAB — SURGICAL PCR SCREEN
MRSA, PCR: NEGATIVE
Staphylococcus aureus: NEGATIVE

## 2023-07-12 NOTE — Progress Notes (Signed)
PCP - Dr. Nadyne Coombes Cardiologist - Dr. Sherryl Manges - Last office visit 06/26/2023  PPM/ICD - Denies - Pt does have Medtronic Loop Recorder Device Orders - n/a Rep Notified - n/a  Chest x-ray - n/a EKG - 06/26/2023 Stress Test - 07/05/2018 ECHO - 05/05/2019 Cardiac Cath - 06/27/2017  Sleep Study - Denies CPAP - n/a  No DM  Last dose of GLP1 agonist- n/a GLP1 instructions: n/a  Blood Thinner Instructions: Pt instructed to hold Xarelto for 3 days. Last dose will be February 23rd. Aspirin Instructions: n/a  NPO after midnight  COVID TEST- n/a   Anesthesia review: Yes. Cardiac Clearance   Patient denies shortness of breath, fever, cough and chest pain at PAT appointment. Pt denies any respiratory illness/infection in the last two months.   All instructions explained to the patient, with a verbal understanding of the material. Patient agrees to go over the instructions while at home for a better understanding. Patient also instructed to self quarantine after being tested for COVID-19. The opportunity to ask questions was provided.

## 2023-07-13 NOTE — Progress Notes (Addendum)
 Anesthesia Chart Review:  Case: [id number] Date/Time: 07/26/23 0715   Procedure: PLIF,IP,POSTERIOR INSTRUMENTATION L45 - 3C   Anesthesia type: General   Pre-op diagnosis: SPONDYLOLISTHESIS, LUMBAR REGION   [facility]: [facility] ROOM 20 / [facility]   Surgeons: Tressie Stalker, MD       DISCUSSION: Patient is a 78 year old male scheduled for the above procedure.  History includes never smoker, HTN, dyslipidemia, atrial fibrillation, CVA (12/16/99), carotid artery disease (left ICA occlusion/dissection 12/16/99, "proceeded distally to consider surgery" & started on warfarin), syncope (2019; s/p [employer] ILR 05/09/19), mitral regurgitation (trace MR 04/2019), BPH, arthritis, hernia (left IHR 01/21/08). Normal coronaries 06/27/2017 and normal sleep study 05/04/17.  Last cardiology visit with Dr. Graciela Husbands was on 06/26/2023.  He is followed for A-fib, recurrent syncope with orthostatic hypotension and nonsustained VT. Mild non-ischemic CM. Normal coronaries in 05/2017.  Status post loop recorder implant 05/09/19.  Echo on 05/15/19 showed LVEF 55-60% (up from 45-50% 06/26/17), grade 1 diastolic dysfunction, no regional wall motion abnormalities, normal RV systolic function, mild RVH, mildly dilated left LA/RA, trace MR, trivial TR, trivial AR, 44 mm aortic root, estimated RVSP 24.7 mmHg.  No interval syncope since previously visit. ILR interrogation revealed a significant increase in atrial fibrillation. Patient not particular symptomatic.  He will be considered for catheter ablation as his flecainide dose is limited by conduction system slowing.  This is planned following his back surgery in regards to preoperative risk assessment, Dr. Graciela Husbands wrote, "Cardiac function is outstanding. Should be an acceptable risk. Prior stroke was ascribed to carotid dissection so not related to his atrial fibrillation. This will impact recommendations related to holding of his anticoagulation prior to the surgery   ILR Summary report for 05/21/23  -06/24/23: Battery status okay.  Normal device function.  No new symptom, tacky, bradycardia, or pause episodes.  2 new AF episodes, longest 26 minutes, burden 0.7%, most recent episode beginning 06/24/2023 ongoing, presenting ECG consistent with AF, ventricular rates overall controlled, on Xarelto per PA report.  77 tachycardia episodes, longest 3 minutes, 18 seconds, ECGs consistent with SVT, medium rates 158-194 bpm, not a new finding.  He reported last Xarelto is scheduled for 07/22/23.   He has a scheduled CTA of the neck on 07/17/23 per neurosurgery after recent C-spine MRI showed absent flow in the left vertebral artery worrisome for occlusion which was new since 01/07/2019.  ADDENDUM 07/23/23 7:19 PM 2/18/252 CTA neck showed occlusion of the left vertebral artery from the distal V1 segment to the proximal V2 segment with reconstitution of the V4 segment likely via retrograde flow. The right vertebral artery was patent from its origin to the distal V4 segment. The vertebrobasilar confluence was not imaged. No hemodynamically significant bilateral carotid bifurcation stenosis. Prominent osteophytes on left C3-4 and C5-6 impacting the expected course of the left vertebral artery.    VS: BP (!) 137/94   Pulse 82   Temp 36.6 C   Resp 17   Ht 5\' 7"  (1.702 m)   Wt 69.3 kg   SpO2 96%   BMI 23.92 kg/m    PROVIDERS: Dois Davenport, MD is PCP  Sherryl Manges, MD is EP cardiologist Orland Mustard, MD is neurologist. Last visit 02/16/22.   LABS: Labs reviewed: Acceptable for surgery. (all labs ordered are listed, but only abnormal results are displayed)  Labs Reviewed  BASIC METABOLIC PANEL - Abnormal; Notable for the following components:      Result Value   Glucose, Bld 116 (*)  Anion gap 3 (*)    All other components within normal limits  SURGICAL PCR SCREEN  CBC  TYPE AND SCREEN    OTHER: EEG 09/04/17: IMPRESSION: This is a normal EEG for the patients stated age.  There were  no focal, hemispheric or lateralizing features.  No epileptiform activity was recorded.  A normal EEG does not exclude the diagnosis of a seizure disorder and if seizure remains high on the list of differential diagnosis, an ambulatory EEG may be of value.  Clinical correlation is required.  Nocturnal Polysomnogram 05/04/17: IMPRESSIONS - No significant obstructive sleep apnea occurred during this study (AHI = 1.0/h). - No significant central sleep apnea occurred during this study (CAI = 0.0/h). - Mild oxygen desaturation was noted during this study (Min O2 = 89.00%). - The patient snored with soft snoring volume. - EKG findings include PVCs. - Clinically significant periodic limb movements did not occur during sleep. No significant associated arousals. DIAGNOSIS - Normal Study - PVCs   IMAGES: CTA Neck 07/17/23:  IMPRESSION: 1. Occlusion of the left vertebral artery from the distal V1 segment to the proximal V4 segment. Reconstitution of the V4 segment likely via retrograde flow. 2. The right vertebral artery is patent from its origin to the distal V4 segment. The vertebrobasilar confluence is not imaged on the current study. 3. Mild atherosclerosis of the bilateral carotid bifurcations without hemodynamically significant stenosis. 4. Degenerative changes in the cervical spine. Prominent facet osteophytes on the left at C3-4 and C5-6 impacting the expected course of the left vertebral artery.   MRI C-spine 06/29/23: IMPRESSION: 1. Absent flow void in the left vertebral artery is worrisome for a left vertebral artery occlusion, new from 01/07/2019. Recommend further evaluation with a CTA of the neck, if not previously performed. 2. Multilevel degenerative changes of the cervical spine, most pronounced at C3-C4 where there is spondylolisthesis that results in severe left and moderate to severe right neural foraminal narrowing. 3. High-grade neural foraminal narrowing is also  present at C5-C6 (left).  MRI L-spine 04/23/23: Images in [facility]/PACS.  CT Chest 10/31/22 ([facility], [facility]): MPRESSION: 1. No acute cardiopulmonary abnormalities. 2. Previously noted lower lobe nodules are stable from 07/20/2022. Not imaged on the previous CT of the abdomen pelvis are several upper lobe lung nodules measuring up to 6 mm. Per Fleischner Society Guidelines, recommend a non-contrast Chest CT at 3-6 months, then another non-contrast Chest CT at 18-24 months. 3. Diffuse bronchial wall thickening with emphysema, as above; imaging findings suggestive of underlying COPD. 4. Coronary artery calcifications. 5. Aortic Atherosclerosis (ICD10-I70.0).   Cerebral Arteriogram 05/16/19: INDICATION: Vertebrobasilar insufficiency comprising of drop attacks, dizziness, vertigo and blurred vision with upward gaze and positional changes. Abnormal CT angiogram of the head and neck. IMPRESSION: - Two areas of significant narrowing of the proximal left vertebral artery at the level of C4-5, and at the upper aspect of C3 most likely due to extrinsic compression. - Moderate to moderately severe tortuosity of the internal carotid arteries in the mid and distal cervical segments bilaterally may be secondary to sustained chronic hypertension. PLAN: Patient to follow-up with referring MD.      EKG: 06/26/23: Sinus rhythm with 1st degree A-V block Left axis deviation When compared with ECG of 06-Jul-2017 11:42, PR interval has increased Confirmed by Sherryl Manges [phone]) on 06/26/2023 9:52:15 AM   CV: Echo 05/15/19: IMPRESSIONS   1. Left ventricular ejection fraction, by visual estimation, is 55 to  60%. The left ventricle has normal function.  There is no left ventricular  hypertrophy.   2. Left ventricular diastolic parameters are consistent with Grade I  diastolic dysfunction (impaired relaxation).   3. The left ventricle has no regional wall motion abnormalities.   4.  Global right ventricle has normal systolic function.The right  ventricular size is mildly enlarged. No increase in right ventricular wall  thickness.   5. Left atrial size was mildly dilated.   6. Right atrial size was mildly dilated.   7. Mild mitral annular calcification.   8. The mitral valve is normal in structure. Trace mitral valve  regurgitation. No evidence of mitral stenosis.   9. The tricuspid valve is normal in structure. Tricuspid valve  regurgitation is trivial.  10. The aortic valve is tricuspid. Aortic valve regurgitation is trivial.  No evidence of aortic valve sclerosis or stenosis.  11. There is mild to moderate dilatation of the aortic root measuring 44  mm.  12. The tricuspid regurgitant velocity is 2.33 m/s, and with an assumed  right atrial pressure of 3 mmHg, the estimated right ventricular systolic  pressure is normal at 24.7 mmHg.  13. The inferior vena cava is normal in size with greater than 50%  respiratory variability, suggesting right atrial pressure of 3 mmHg.  - In comparison to the previous echocardiogram(s): 06/26/17 EF 45-50%.    Cardiac event monitor 03/06/19 - 04/04/19: Indication:syncope Duration: 30d   Findings PVCs 1%  PACs   VT Nonsustained   episodes; fastest 163 bpm for 7 beats;   Pt symptoms correlated with PACs HR    Min 45-Max 123     With hx of syncope and cardiomyopathy would consider repeat echo and possible further eval including implantable loop     ETT 07/05/17: Blood pressure demonstrated a normal response to exercise. There was no ST segment deviation noted during stress. No T wave inversion was noted during stress. Heart rate reached 78% of maximum predicted. 85% is required for an adequate study. No ischemic changes noted, though heart rate did not reach target. Excellent exercise capacity.    Cath 06/27/17: Conclusion   Normal coronary arteries. Right dominant anatomy. EF 50% with normal filling pressures.      Past Medical History:  Diagnosis Date   Arthritis    BPH (benign prostatic hyperplasia)    Carotid artery disease (HCC)    Left internal carotid artery spontaneous dissection in the past  //   Doppler, October, 2013, minimal plaque, 0-39% bilateral   Chronic low back pain 05/03/2017   CVA (cerebral infarction)    left insular cortex stroke with left internal carotid artery dissection and near occlusion   Dyslipidemia    Dysrhythmia    A. Fib   Hematuria    History of kidney stones    Bladder Stone   Hypertension    Mitral regurgitation    mild, echo, March, 2010   Peripheral vascular disease (HCC)    Carotid Artery Disease.   Persistent atrial fibrillation (HCC)    Stroke (HCC) 04/2019   No deficits   Syncope    Syncope and collapse 12/21/2017    Past Surgical History:  Procedure Laterality Date   BLADDER STONE REMOVAL  2010   Along with prostate surgery   COLONOSCOPY WITH PROPOFOL Bilateral 09/27/2022   Procedure: COLONOSCOPY WITH PROPOFOL;  Surgeon: Willis Modena, MD;  Location: [facility];  Service: Gastroenterology;  Laterality: Bilateral;   INGUINAL HERNIA REPAIR Left    Sometime after prostate surgery   IR ANGIO  INTRA EXTRACRAN SEL COM CAROTID INNOMINATE BILAT MOD SED  05/16/2019   IR ANGIO VERTEBRAL SEL VERTEBRAL BILAT MOD SED  05/16/2019   IR US GUIDE VASC ACCESS RIGHT  05/16/2019   LEFT HEART CATH AND CORONARY ANGIOGRAPHY N/A 06/27/2017   Procedure: LEFT HEART CATH AND CORONARY ANGIOGRAPHY;  Surgeon: Lyn Records, MD;  [facility];  Service: Cardiovascular;  Laterality: N/A;   POLYPECTOMY  09/27/2022   Procedure: POLYPECTOMY;  Surgeon: Willis Modena, MD;  Location: [facility];  Service: Gastroenterology;;   PROSTATE SURGERY  2010   Part of prostate removed due to enlarged   WRIST SURGERY     Right    MEDICATIONS:  atorvastatin (LIPITOR) 20 MG tablet   Cholecalciferol (VITAMIN D3) 50 MCG (2000 UT) CAPS   diltiazem (CARDIZEM  CD) 120 MG 24 hr capsule   flecainide (TAMBOCOR) 50 MG tablet   MAGNESIUM CITRATE PO   Multiple Vitamin (MULTIVITAMIN WITH MINERALS) TABS tablet   naproxen sodium (ALEVE) 220 MG tablet   rivaroxaban (XARELTO) 20 MG TABS tablet   TIADYLT ER 120 MG 24 hr capsule   No current facility-administered medications for this encounter.    Shonna Chock, PA-C Surgical Short Stay/Anesthesiology [facility] Phone [phone] [facility] Phone [phone] 07/13/2023 5:22 PM

## 2023-07-17 ENCOUNTER — Ambulatory Visit
Admission: RE | Admit: 2023-07-17 | Discharge: 2023-07-17 | Disposition: A | Discharge status: Home / Self Care | Payer: Medicare Other | Source: Ambulatory Visit | Attending: Student

## 2023-07-17 DIAGNOSIS — I6502 Occlusion and stenosis of left vertebral artery: Secondary | ICD-10-CM

## 2023-07-17 MED ORDER — IOPAMIDOL (ISOVUE-370) INJECTION 76%
75.0000 mL | Freq: Once | INTRAVENOUS | Status: AC | PRN
Start: 2023-07-17 — End: 2023-07-17
  Administered 2023-07-17: 75 mL via INTRAVENOUS

## 2023-07-24 NOTE — Anesthesia Preprocedure Evaluation (Signed)
Anesthesia Evaluation    Airway        Dental   Pulmonary           Cardiovascular hypertension,      Neuro/Psych    GI/Hepatic   Endo/Other    Renal/GU      Musculoskeletal   Abdominal   Peds  Hematology   Anesthesia Other Findings   Reproductive/Obstetrics                             Anesthesia Physical Anesthesia Plan  ASA:   Anesthesia Plan:    Post-op Pain Management:    Induction:   PONV Risk Score and Plan:   Airway Management Planned:   Additional Equipment:   Intra-op Plan:   Post-operative Plan:   Informed Consent:   Plan Discussed with:   Anesthesia Plan Comments: (PAT note written by Tambria Pfannenstiel, PA-C.  )       Anesthesia Quick Evaluation  

## 2023-07-26 ENCOUNTER — Ambulatory Visit (HOSPITAL_BASED_OUTPATIENT_CLINIC_OR_DEPARTMENT_OTHER): Payer: Medicare Other | Admitting: Anesthesiology

## 2023-07-26 ENCOUNTER — Ambulatory Visit (HOSPITAL_COMMUNITY): Payer: Medicare Other

## 2023-07-26 ENCOUNTER — Ambulatory Visit (HOSPITAL_COMMUNITY)
Admission: RE | Admit: 2023-07-26 | Discharge: 2023-07-27 | Disposition: A | Discharge status: Home / Self Care | Payer: Medicare Other | Source: Ambulatory Visit | Attending: Neurosurgery | Admitting: Neurosurgery

## 2023-07-26 ENCOUNTER — Ambulatory Visit (HOSPITAL_COMMUNITY)
Admission: RE | Disposition: A | Discharge status: Home / Self Care | Payer: Self-pay | Source: Ambulatory Visit | Attending: Neurosurgery

## 2023-07-26 ENCOUNTER — Other Ambulatory Visit: Payer: Self-pay

## 2023-07-26 ENCOUNTER — Encounter (HOSPITAL_COMMUNITY): Payer: Self-pay | Admitting: Neurosurgery

## 2023-07-26 ENCOUNTER — Ambulatory Visit (HOSPITAL_COMMUNITY): Payer: Medicare Other | Admitting: Vascular Surgery

## 2023-07-26 DIAGNOSIS — I10 Essential (primary) hypertension: Secondary | ICD-10-CM

## 2023-07-26 DIAGNOSIS — Z7901 Long term (current) use of anticoagulants: Secondary | ICD-10-CM | POA: Diagnosis not present

## 2023-07-26 DIAGNOSIS — E785 Hyperlipidemia, unspecified: Secondary | ICD-10-CM

## 2023-07-26 DIAGNOSIS — M4316 Spondylolisthesis, lumbar region: Secondary | ICD-10-CM

## 2023-07-26 DIAGNOSIS — M5116 Intervertebral disc disorders with radiculopathy, lumbar region: Secondary | ICD-10-CM | POA: Diagnosis not present

## 2023-07-26 DIAGNOSIS — M48062 Spinal stenosis, lumbar region with neurogenic claudication: Secondary | ICD-10-CM | POA: Diagnosis present

## 2023-07-26 DIAGNOSIS — M4726 Other spondylosis with radiculopathy, lumbar region: Secondary | ICD-10-CM | POA: Insufficient documentation

## 2023-07-26 DIAGNOSIS — I4891 Unspecified atrial fibrillation: Secondary | ICD-10-CM | POA: Diagnosis not present

## 2023-07-26 HISTORY — PX: POSTERIOR FUSION PEDICLE SCREW PLACEMENT: SHX2186

## 2023-07-26 LAB — ABO/RH: ABO/RH(D): O POS

## 2023-07-26 SURGERY — POSTERIOR LUMBAR FUSION 1 LEVEL
Anesthesia: General

## 2023-07-26 MED ORDER — MENTHOL 3 MG MT LOZG: 1.0000 | LOZENGE | OROMUCOSAL | Status: DC | PRN

## 2023-07-26 MED ORDER — OXYCODONE HCL 5 MG PO TABS: 5.0000 mg | ORAL_TABLET | Freq: Once | ORAL | Status: DC | PRN

## 2023-07-26 MED ORDER — CYCLOBENZAPRINE HCL 10 MG PO TABS
10.0000 mg | ORAL_TABLET | Freq: Three times a day (TID) | ORAL | Status: DC | PRN
  Administered 2023-07-26: 10 mg via ORAL
  Filled 2023-07-26: qty 1

## 2023-07-26 MED ORDER — DEXAMETHASONE SODIUM PHOSPHATE 10 MG/ML IJ SOLN
INTRAMUSCULAR | Status: DC | PRN
  Administered 2023-07-26: 5 mg via INTRAVENOUS

## 2023-07-26 MED ORDER — THROMBIN 5000 UNITS EX KIT
PACK | CUTANEOUS | Status: AC
  Filled 2023-07-26: qty 1

## 2023-07-26 MED ORDER — BUPIVACAINE LIPOSOME 1.3 % IJ SUSP
INTRAMUSCULAR | Status: AC
  Filled 2023-07-26: qty 20

## 2023-07-26 MED ORDER — CEFAZOLIN SODIUM-DEXTROSE 2-4 GM/100ML-% IV SOLN
2.0000 g | Freq: Three times a day (TID) | INTRAVENOUS | Status: AC
  Administered 2023-07-26 (×2): 2 g via INTRAVENOUS
  Filled 2023-07-26 (×2): qty 100

## 2023-07-26 MED ORDER — CHLORHEXIDINE GLUCONATE CLOTH 2 % EX PADS: 6.0000 | MEDICATED_PAD | Freq: Once | CUTANEOUS | Status: DC

## 2023-07-26 MED ORDER — SODIUM CHLORIDE 0.9% FLUSH
3.0000 mL | Freq: Two times a day (BID) | INTRAVENOUS | Status: DC
  Administered 2023-07-26: 3 mL via INTRAVENOUS

## 2023-07-26 MED ORDER — ORAL CARE MOUTH RINSE: 15.0000 mL | Freq: Once | OROMUCOSAL | Status: AC

## 2023-07-26 MED ORDER — SODIUM CHLORIDE 0.9% FLUSH: 3.0000 mL | INTRAVENOUS | Status: DC | PRN

## 2023-07-26 MED ORDER — PHENYLEPHRINE HCL-NACL 20-0.9 MG/250ML-% IV SOLN
INTRAVENOUS | Status: DC | PRN
  Administered 2023-07-26: 25 ug/min via INTRAVENOUS

## 2023-07-26 MED ORDER — VASHE WOUND IRRIGATION OPTIME
TOPICAL | Status: DC | PRN
  Administered 2023-07-26: 34 [oz_av] via TOPICAL

## 2023-07-26 MED ORDER — PROPOFOL 10 MG/ML IV BOLUS
INTRAVENOUS | Status: AC
  Filled 2023-07-26: qty 20

## 2023-07-26 MED ORDER — FENTANYL CITRATE (PF) 250 MCG/5ML IJ SOLN
INTRAMUSCULAR | Status: AC
  Filled 2023-07-26: qty 5

## 2023-07-26 MED ORDER — ACETAMINOPHEN 500 MG PO TABS
1000.0000 mg | ORAL_TABLET | Freq: Four times a day (QID) | ORAL | Status: DC
  Administered 2023-07-26 (×2): 1000 mg via ORAL
  Filled 2023-07-26 (×2): qty 2

## 2023-07-26 MED ORDER — HEMOSTATIC AGENTS (NO CHARGE) OPTIME
TOPICAL | Status: DC | PRN
  Administered 2023-07-26: 1 via TOPICAL

## 2023-07-26 MED ORDER — LACTATED RINGERS IV SOLN: INTRAVENOUS | Status: DC

## 2023-07-26 MED ORDER — OXYCODONE HCL 5 MG PO TABS: 10.0000 mg | ORAL_TABLET | ORAL | Status: DC | PRN

## 2023-07-26 MED ORDER — PHENOL 1.4 % MT LIQD: 1.0000 | OROMUCOSAL | Status: DC | PRN

## 2023-07-26 MED ORDER — DOCUSATE SODIUM 100 MG PO CAPS
100.0000 mg | ORAL_CAPSULE | Freq: Two times a day (BID) | ORAL | Status: DC
  Administered 2023-07-26 – 2023-07-27 (×3): 100 mg via ORAL
  Filled 2023-07-26 (×3): qty 1

## 2023-07-26 MED ORDER — CHLORHEXIDINE GLUCONATE CLOTH 2 % EX PADS
6.0000 | MEDICATED_PAD | Freq: Once | CUTANEOUS | Status: DC
Start: 2023-07-26 — End: 2023-07-26

## 2023-07-26 MED ORDER — EPHEDRINE SULFATE-NACL 50-0.9 MG/10ML-% IV SOSY
PREFILLED_SYRINGE | INTRAVENOUS | Status: DC | PRN
  Administered 2023-07-26 (×3): 5 mg via INTRAVENOUS

## 2023-07-26 MED ORDER — PROPOFOL 10 MG/ML IV BOLUS
INTRAVENOUS | Status: DC | PRN
  Administered 2023-07-26: 150 mg via INTRAVENOUS

## 2023-07-26 MED ORDER — THROMBIN 5000 UNITS EX SOLR
OROMUCOSAL | Status: DC | PRN
  Administered 2023-07-26 (×2): 5 mL via TOPICAL

## 2023-07-26 MED ORDER — EPHEDRINE 5 MG/ML INJ
INTRAVENOUS | Status: AC
  Filled 2023-07-26: qty 5

## 2023-07-26 MED ORDER — BUPIVACAINE-EPINEPHRINE (PF) 0.5% -1:200000 IJ SOLN
INTRAMUSCULAR | Status: AC
  Filled 2023-07-26: qty 30

## 2023-07-26 MED ORDER — BACITRACIN ZINC 500 UNIT/GM EX OINT
TOPICAL_OINTMENT | CUTANEOUS | Status: DC | PRN
  Administered 2023-07-26: 1 via TOPICAL

## 2023-07-26 MED ORDER — ROCURONIUM BROMIDE 10 MG/ML (PF) SYRINGE
PREFILLED_SYRINGE | INTRAVENOUS | Status: AC
  Filled 2023-07-26: qty 10

## 2023-07-26 MED ORDER — FENTANYL CITRATE (PF) 100 MCG/2ML IJ SOLN
25.0000 ug | INTRAMUSCULAR | Status: DC | PRN
  Administered 2023-07-26: 50 ug via INTRAVENOUS

## 2023-07-26 MED ORDER — LIDOCAINE 2% (20 MG/ML) 5 ML SYRINGE
INTRAMUSCULAR | Status: AC
Start: 2023-07-26 — End: ?
  Filled 2023-07-26: qty 5

## 2023-07-26 MED ORDER — PHENYLEPHRINE 80 MCG/ML (10ML) SYRINGE FOR IV PUSH (FOR BLOOD PRESSURE SUPPORT)
PREFILLED_SYRINGE | INTRAVENOUS | Status: AC
  Filled 2023-07-26: qty 10

## 2023-07-26 MED ORDER — CEFAZOLIN SODIUM-DEXTROSE 2-4 GM/100ML-% IV SOLN
2.0000 g | INTRAVENOUS | Status: AC
  Administered 2023-07-26: 2 g via INTRAVENOUS
  Filled 2023-07-26: qty 100

## 2023-07-26 MED ORDER — ATORVASTATIN CALCIUM 10 MG PO TABS
20.0000 mg | ORAL_TABLET | Freq: Every day | ORAL | Status: DC
  Administered 2023-07-26: 20 mg via ORAL
  Filled 2023-07-26 (×2): qty 2

## 2023-07-26 MED ORDER — ONDANSETRON HCL 4 MG/2ML IJ SOLN
INTRAMUSCULAR | Status: AC
  Filled 2023-07-26: qty 2

## 2023-07-26 MED ORDER — OXYCODONE HCL 5 MG PO TABS
5.0000 mg | ORAL_TABLET | ORAL | Status: DC | PRN
  Administered 2023-07-26 – 2023-07-27 (×3): 5 mg via ORAL
  Filled 2023-07-26 (×5): qty 1

## 2023-07-26 MED ORDER — 0.9 % SODIUM CHLORIDE (POUR BTL) OPTIME
TOPICAL | Status: DC | PRN
  Administered 2023-07-26: 1000 mL

## 2023-07-26 MED ORDER — ACETAMINOPHEN 325 MG PO TABS: 650.0000 mg | ORAL_TABLET | ORAL | Status: DC | PRN

## 2023-07-26 MED ORDER — FLECAINIDE ACETATE 50 MG PO TABS
50.0000 mg | ORAL_TABLET | Freq: Two times a day (BID) | ORAL | Status: DC
  Administered 2023-07-26 – 2023-07-27 (×2): 50 mg via ORAL
  Filled 2023-07-26 (×3): qty 1

## 2023-07-26 MED ORDER — ALBUMIN HUMAN 5 % IV SOLN: INTRAVENOUS | Status: DC | PRN

## 2023-07-26 MED ORDER — MORPHINE SULFATE (PF) 2 MG/ML IV SOLN: 2.0000 mg | INTRAVENOUS | Status: DC | PRN

## 2023-07-26 MED ORDER — SODIUM CHLORIDE 0.9 % IV SOLN
250.0000 mL | INTRAVENOUS | Status: DC
  Administered 2023-07-26: 250 mL via INTRAVENOUS

## 2023-07-26 MED ORDER — DEXAMETHASONE SODIUM PHOSPHATE 10 MG/ML IJ SOLN
INTRAMUSCULAR | Status: AC
  Filled 2023-07-26: qty 1

## 2023-07-26 MED ORDER — THROMBIN 20000 UNITS EX SOLR
CUTANEOUS | Status: AC
  Filled 2023-07-26: qty 20000

## 2023-07-26 MED ORDER — LIDOCAINE 2% (20 MG/ML) 5 ML SYRINGE
INTRAMUSCULAR | Status: DC | PRN
  Administered 2023-07-26: 80 mg via INTRAVENOUS

## 2023-07-26 MED ORDER — ACETAMINOPHEN 500 MG PO TABS
1000.0000 mg | ORAL_TABLET | Freq: Once | ORAL | Status: AC
  Administered 2023-07-26: 1000 mg via ORAL
  Filled 2023-07-26: qty 2

## 2023-07-26 MED ORDER — DILTIAZEM HCL ER COATED BEADS 120 MG PO CP24
120.0000 mg | ORAL_CAPSULE | Freq: Every day | ORAL | Status: DC
  Administered 2023-07-26: 120 mg via ORAL
  Filled 2023-07-26 (×2): qty 1

## 2023-07-26 MED ORDER — SUGAMMADEX SODIUM 200 MG/2ML IV SOLN
INTRAVENOUS | Status: AC
  Filled 2023-07-26: qty 2

## 2023-07-26 MED ORDER — CHLORHEXIDINE GLUCONATE 0.12 % MT SOLN
15.0000 mL | Freq: Once | OROMUCOSAL | Status: AC
  Administered 2023-07-26: 15 mL via OROMUCOSAL
  Filled 2023-07-26: qty 15

## 2023-07-26 MED ORDER — ONDANSETRON HCL 4 MG/2ML IJ SOLN
INTRAMUSCULAR | Status: DC | PRN
  Administered 2023-07-26: 4 mg via INTRAVENOUS

## 2023-07-26 MED ORDER — ONDANSETRON HCL 4 MG/2ML IJ SOLN: 4.0000 mg | Freq: Four times a day (QID) | INTRAMUSCULAR | Status: DC | PRN

## 2023-07-26 MED ORDER — BUPIVACAINE-EPINEPHRINE (PF) 0.5% -1:200000 IJ SOLN
INTRAMUSCULAR | Status: DC | PRN
  Administered 2023-07-26: 10 mL via PERINEURAL

## 2023-07-26 MED ORDER — ACETAMINOPHEN 650 MG RE SUPP: 650.0000 mg | RECTAL | Status: DC | PRN

## 2023-07-26 MED ORDER — ONDANSETRON HCL 4 MG PO TABS: 4.0000 mg | ORAL_TABLET | Freq: Four times a day (QID) | ORAL | Status: DC | PRN

## 2023-07-26 MED ORDER — BISACODYL 10 MG RE SUPP: 10.0000 mg | Freq: Every day | RECTAL | Status: DC | PRN

## 2023-07-26 MED ORDER — BACITRACIN ZINC 500 UNIT/GM EX OINT
TOPICAL_OINTMENT | CUTANEOUS | Status: AC
  Filled 2023-07-26: qty 28.35

## 2023-07-26 MED ORDER — OXYCODONE HCL 5 MG/5ML PO SOLN: 5.0000 mg | Freq: Once | ORAL | Status: DC | PRN

## 2023-07-26 MED ORDER — ROCURONIUM BROMIDE 10 MG/ML (PF) SYRINGE
PREFILLED_SYRINGE | INTRAVENOUS | Status: DC | PRN
  Administered 2023-07-26: 10 mg via INTRAVENOUS
  Administered 2023-07-26: 40 mg via INTRAVENOUS
  Administered 2023-07-26: 20 mg via INTRAVENOUS
  Administered 2023-07-26: 10 mg via INTRAVENOUS

## 2023-07-26 MED ORDER — SUGAMMADEX SODIUM 200 MG/2ML IV SOLN
INTRAVENOUS | Status: DC | PRN
  Administered 2023-07-26: 200 mg via INTRAVENOUS

## 2023-07-26 MED ORDER — PHENYLEPHRINE 80 MCG/ML (10ML) SYRINGE FOR IV PUSH (FOR BLOOD PRESSURE SUPPORT)
PREFILLED_SYRINGE | INTRAVENOUS | Status: DC | PRN
  Administered 2023-07-26 (×4): 80 ug via INTRAVENOUS

## 2023-07-26 MED ORDER — FENTANYL CITRATE (PF) 250 MCG/5ML IJ SOLN
INTRAMUSCULAR | Status: DC | PRN
  Administered 2023-07-26 (×4): 50 ug via INTRAVENOUS

## 2023-07-26 MED ORDER — FENTANYL CITRATE (PF) 100 MCG/2ML IJ SOLN
INTRAMUSCULAR | Status: AC
  Filled 2023-07-26: qty 2

## 2023-07-26 MED ORDER — AMISULPRIDE (ANTIEMETIC) 5 MG/2ML IV SOLN: 10.0000 mg | Freq: Once | INTRAVENOUS | Status: DC | PRN

## 2023-07-26 SURGICAL SUPPLY — 63 items
BAG COUNTER SPONGE SURGICOUNT (BAG) ×1 IMPLANT
BASKET BONE COLLECTION (BASKET) ×1 IMPLANT
BENZOIN TINCTURE PRP APPL 2/3 (GAUZE/BANDAGES/DRESSINGS) ×1 IMPLANT
BLADE CLIPPER SURG (BLADE) IMPLANT
BUR MATCHSTICK NEURO 3.0 LAGG (BURR) ×1 IMPLANT
BUR PRECISION FLUTE 6.0 (BURR) ×1 IMPLANT
CAGE ALTERA 10X31X9-13 15D (Cage) IMPLANT
CANISTER SUCT 3000ML PPV (MISCELLANEOUS) ×1 IMPLANT
CAP LOCK DLX THRD (Cap) IMPLANT
CLEANSER WND VASHE INSTL 34OZ (WOUND CARE) ×1 IMPLANT
CLSR STERI-STRIP ANTIMIC 1/2X4 (GAUZE/BANDAGES/DRESSINGS) IMPLANT
CNTNR URN SCR LID CUP LEK RST (MISCELLANEOUS) ×1 IMPLANT
COVER BACK TABLE 60X90IN (DRAPES) ×1 IMPLANT
DRAPE C-ARM 42X72 X-RAY (DRAPES) ×2 IMPLANT
DRAPE HALF SHEET 40X57 (DRAPES) ×1 IMPLANT
DRAPE LAPAROTOMY 100X72X124 (DRAPES) ×1 IMPLANT
DRAPE SURG 17X23 STRL (DRAPES) ×1 IMPLANT
DRSG OPSITE POSTOP 4X10 (GAUZE/BANDAGES/DRESSINGS) IMPLANT
DRSG OPSITE POSTOP 4X6 (GAUZE/BANDAGES/DRESSINGS) ×1 IMPLANT
ELECT BLADE 4.0 EZ CLEAN MEGAD (MISCELLANEOUS) ×1 IMPLANT
ELECT REM PT RETURN 9FT ADLT (ELECTROSURGICAL) ×1 IMPLANT
ELECTRODE BLDE 4.0 EZ CLN MEGD (MISCELLANEOUS) ×1 IMPLANT
ELECTRODE REM PT RTRN 9FT ADLT (ELECTROSURGICAL) ×1 IMPLANT
EVACUATOR 1/8 PVC DRAIN (DRAIN) IMPLANT
GAUZE 4X4 16PLY ~~LOC~~+RFID DBL (SPONGE) ×1 IMPLANT
GLOVE BIO SURGEON STRL SZ 6 (GLOVE) ×1 IMPLANT
GLOVE BIO SURGEON STRL SZ8 (GLOVE) ×2 IMPLANT
GLOVE BIO SURGEON STRL SZ8.5 (GLOVE) ×2 IMPLANT
GLOVE BIOGEL PI IND STRL 6.5 (GLOVE) ×1 IMPLANT
GLOVE EXAM NITRILE XL STR (GLOVE) IMPLANT
GOWN STRL REUS W/ TWL LRG LVL3 (GOWN DISPOSABLE) ×1 IMPLANT
GOWN STRL REUS W/ TWL XL LVL3 (GOWN DISPOSABLE) ×2 IMPLANT
GOWN STRL REUS W/TWL 2XL LVL3 (GOWN DISPOSABLE) IMPLANT
HEMOSTAT POWDER KIT SURGIFOAM (HEMOSTASIS) ×1 IMPLANT
KIT BASIN OR (CUSTOM PROCEDURE TRAY) ×1 IMPLANT
KIT GRAFTMAG DEL NEURO DISP (NEUROSURGERY SUPPLIES) IMPLANT
KIT POSITION SURG JACKSON T1 (MISCELLANEOUS) ×1 IMPLANT
KIT TURNOVER KIT B (KITS) ×1 IMPLANT
NDL HYPO 21X1.5 SAFETY (NEEDLE) ×1 IMPLANT
NDL HYPO 22X1.5 SAFETY MO (MISCELLANEOUS) ×1 IMPLANT
NEEDLE HYPO 21X1.5 SAFETY (NEEDLE) ×1 IMPLANT
NEEDLE HYPO 22X1.5 SAFETY MO (MISCELLANEOUS) ×1 IMPLANT
NS IRRIG 1000ML POUR BTL (IV SOLUTION) ×1 IMPLANT
PACK LAMINECTOMY NEURO (CUSTOM PROCEDURE TRAY) ×1 IMPLANT
PAD ARMBOARD 7.5X6 YLW CONV (MISCELLANEOUS) ×3 IMPLANT
PATTIES SURGICAL .5 X1 (DISPOSABLE) IMPLANT
PUTTY DBM 5CC CALC GRAN (Putty) IMPLANT
ROD CURVED TI 6.35X45 (Rod) IMPLANT
SCREW PA DLX CREO 7.5X50 (Screw) IMPLANT
SCREW PA DLX CREO 7.5X55 (Screw) IMPLANT
SPIKE FLUID TRANSFER (MISCELLANEOUS) ×1 IMPLANT
SPONGE NEURO XRAY DETECT 1X3 (DISPOSABLE) IMPLANT
SPONGE SURGIFOAM ABS GEL 100 (HEMOSTASIS) IMPLANT
SPONGE T-LAP 4X18 ~~LOC~~+RFID (SPONGE) IMPLANT
STRIP CLOSURE SKIN 1/2X4 (GAUZE/BANDAGES/DRESSINGS) ×1 IMPLANT
SUT PROLENE 6 0 BV (SUTURE) IMPLANT
SUT VIC AB 1 CT1 18XBRD ANBCTR (SUTURE) ×2 IMPLANT
SUT VIC AB 2-0 CP2 18 (SUTURE) ×2 IMPLANT
SYR 20ML LL LF (SYRINGE) IMPLANT
TOWEL GREEN STERILE (TOWEL DISPOSABLE) ×1 IMPLANT
TOWEL GREEN STERILE FF (TOWEL DISPOSABLE) ×1 IMPLANT
TRAY FOLEY MTR SLVR 16FR STAT (SET/KITS/TRAYS/PACK) ×1 IMPLANT
WATER STERILE IRR 1000ML POUR (IV SOLUTION) ×1 IMPLANT

## 2023-07-26 NOTE — Anesthesia Postprocedure Evaluation (Signed)
 Anesthesia Post Note  Patient: REESE STOCKMAN  Procedure(s) Performed: POSTERIOR LUMBAR INTERBODY FUSION, INTERBODY PROSTHESIS,POSTERIOR INSTRUMENTATION LUMBAR FOUR TO LUMBAR FIVE     Patient location during evaluation: PACU Anesthesia Type: General Level of consciousness: awake and alert Pain management: pain level controlled Vital Signs Assessment: post-procedure vital signs reviewed and stable Respiratory status: spontaneous breathing, nonlabored ventilation, respiratory function stable and patient connected to nasal cannula oxygen Cardiovascular status: blood pressure returned to baseline and stable Postop Assessment: no apparent nausea or vomiting Anesthetic complications: no  No notable events documented.  Last Vitals:  Vitals:   07/26/23 1345 07/26/23 1350  BP: 121/81   Pulse: 90 89  Resp: 18 16  Temp: 36.4 C   SpO2: 92% 94%    Last Pain:  Vitals:   07/26/23 1300  TempSrc:   PainSc: 3     LLE Motor Response: Purposeful movement (07/26/23 1345) LLE Sensation: Full sensation (07/26/23 1345) RLE Motor Response: Purposeful movement (07/26/23 1345) RLE Sensation: Full sensation (07/26/23 1345)      Kennieth Rad

## 2023-07-26 NOTE — H&P (Signed)
 Subjective: The patient is a 78 year old white male on Xarelto who has complained of back and right greater left leg pain consistent with neurogenic claudication.  He has failed medical management.  He was worked up with a lumbar MRI and lumbar x-rays which demonstrated an L4-5 spondylolisthesis spinal stenosis.  I discussed the various treatment options with him.  He has decided proceed with surgery.  Past Medical History:  Diagnosis Date   Arthritis    BPH (benign prostatic hyperplasia)    Carotid artery disease (HCC)    Left internal carotid artery spontaneous dissection in the past  //   Doppler, October, 2013, minimal plaque, 0-39% bilateral   Chronic low back pain 05/03/2017   CVA (cerebral infarction)    left insular cortex stroke with left internal carotid artery dissection and near occlusion   Dyslipidemia    Dysrhythmia    A. Fib   Hematuria    History of kidney stones    Bladder Stone   Hypertension    Mitral regurgitation    mild, echo, March, 2010   Peripheral vascular disease (HCC)    Carotid Artery Disease.   Persistent atrial fibrillation (HCC)    Stroke (HCC) 04/2019   No deficits   Syncope    Syncope and collapse 12/21/2017    Past Surgical History:  Procedure Laterality Date   BLADDER STONE REMOVAL  2010   Along with prostate surgery   COLONOSCOPY WITH PROPOFOL Bilateral 09/27/2022   Procedure: COLONOSCOPY WITH PROPOFOL;  Surgeon: Willis Modena, MD;  Location: [facility];  Service: Gastroenterology;  Laterality: Bilateral;   INGUINAL HERNIA REPAIR Left    Sometime after prostate surgery   IR ANGIO INTRA EXTRACRAN SEL COM CAROTID INNOMINATE BILAT MOD SED  05/16/2019   IR ANGIO VERTEBRAL SEL VERTEBRAL BILAT MOD SED  05/16/2019   IR US GUIDE VASC ACCESS RIGHT  05/16/2019   LEFT HEART CATH AND CORONARY ANGIOGRAPHY N/A 06/27/2017   Procedure: LEFT HEART CATH AND CORONARY ANGIOGRAPHY;  Surgeon: Lyn Records, MD;  [facility];  Service:  Cardiovascular;  Laterality: N/A;   POLYPECTOMY  09/27/2022   Procedure: POLYPECTOMY;  Surgeon: Willis Modena, MD;  Location: [facility];  Service: Gastroenterology;;   PROSTATE SURGERY  2010   Part of prostate removed due to enlarged   WRIST SURGERY     Right    Allergies  Allergen Reactions   Shellfish Allergy Anaphylaxis    Social History   Tobacco Use   Smoking status: Never   Smokeless tobacco: Never  Substance Use Topics   Alcohol use: Not Currently    Comment: due to a-fib    Family History  Problem Relation Age of Onset   Arrhythmia Mother    Hypertension Mother    Arthritis Mother    Other Father        tumor   Prostate cancer Father    CVA Neg Hx    Prior to Admission medications   Medication Sig Start Date End Date Taking? Authorizing Provider  atorvastatin (LIPITOR) 20 MG tablet Take 1 tablet (20 mg total) by mouth daily. 06/20/16  Yes Nahser, Deloris Ping, MD  Cholecalciferol (VITAMIN D3) 50 MCG (2000 UT) CAPS Take 4,000 Units by mouth daily.   Yes [provider]  diltiazem (CARDIZEM CD) 120 MG 24 hr capsule Take 1 capsule (120 mg total) by mouth daily. 03/01/23  Yes Duke Salvia, MD  flecainide (TAMBOCOR) 50 MG tablet Take 1 tablet by mouth twice  daily 08/25/22  Yes Duke Salvia, MD  MAGNESIUM CITRATE PO Take 500 mg by mouth daily.   Yes [provider]  Multiple Vitamin (MULTIVITAMIN WITH MINERALS) TABS tablet Take 1 tablet by mouth daily.   Yes [provider]  naproxen sodium (ALEVE) 220 MG tablet Take 440 mg by mouth daily as needed (pain).   Yes [provider]  rivaroxaban (XARELTO) 20 MG TABS tablet Take 1 tablet (20 mg total) by mouth daily with supper. 03/05/23  Yes Duke Salvia, MD  TIADYLT ER 120 MG 24 hr capsule Take 120 mg by mouth daily. Patient not taking: Reported on 07/12/2023 05/27/23  Yes [provider]     Review of Systems  Positive ROS: As above  All other systems have been reviewed  and were otherwise negative with the exception of those mentioned in the HPI and as above.  Objective: Vital signs in last 24 hours: Temp:  [97.9 F (36.6 C)] 97.9 F (36.6 C) (02/27 0607) Pulse Rate:  [64] 64 (02/27 0607) Resp:  [17] 17 (02/27 0607) BP: (125)/(86) 125/86 (02/27 0607) SpO2:  [97 %] 97 % (02/27 0607) Weight:  [66.9 kg] 66.9 kg (02/27 0607) Estimated body mass index is 23.09 kg/m as calculated from the following:   Height as of this encounter: 5\' 7"  (1.702 m).   Weight as of this encounter: 66.9 kg.   General Appearance: Alert Head: Normocephalic, without obvious abnormality, atraumatic Eyes: PERRL, conjunctiva/corneas clear, EOM's intact,    Ears: Normal  Throat: Normal  Neck: Supple, Back: unremarkable Lungs: Clear to auscultation bilaterally, respirations unlabored Heart: Regular rate and rhythm, no murmur, rub or gallop Abdomen: Soft, non-tender Extremities: Extremities normal, atraumatic, no cyanosis or edema Skin: unremarkable  NEUROLOGIC:   Mental status: alert and oriented,Motor Exam - grossly normal Sensory Exam - grossly normal Reflexes:  Coordination - grossly normal Gait - grossly normal Balance - grossly normal Cranial Nerves: I: smell Not tested  II: visual acuity  OS: Normal  OD: Normal   II: visual fields Full to confrontation  II: pupils Equal, round, reactive to light  III,VII: ptosis None  III,IV,VI: extraocular muscles  Full ROM  V: mastication Normal  V: facial light touch sensation  Normal  V,VII: corneal reflex  Present  VII: facial muscle function - upper  Normal  VII: facial muscle function - lower Normal  VIII: hearing Not tested  IX: soft palate elevation  Normal  IX,X: gag reflex Present  XI: trapezius strength  5/5  XI: sternocleidomastoid strength 5/5  XI: neck flexion strength  5/5  XII: tongue strength  Normal    Data Review Lab Results  Component Value Date   WBC 7.0 07/12/2023   HGB 15.7 07/12/2023   HCT  44.8 07/12/2023   MCV 93.5 07/12/2023   PLT 193 07/12/2023   Lab Results  Component Value Date   NA 138 07/12/2023   K 4.6 07/12/2023   CL 106 07/12/2023   CO2 29 07/12/2023   BUN 10 07/12/2023   CREATININE 1.06 07/12/2023   GLUCOSE 116 (H) 07/12/2023   Lab Results  Component Value Date   INR 1.1 05/16/2019    Assessment/Plan: Lumbar spine listhesis, lumbar spinal stenosis, neurogenic claudication, lumbago: I have discussed the situation with the patient.  I reviewed his imaging studies with him and pointed out the abnormalities.  We have discussed the various treatment options including surgery.  I described the surgical treatment option of an L4-5 decompression, instrumentation  and fusion.  I have shown him surgical models.  I have given him a surgical pamphlet.  We have discussed the risk, benefits, alternatives, expected postop course, and likelihood of achieving our goals with surgery.  I have answered all patient's questions.  He has decided proceed with surgery.   Cristi Loron 07/26/2023 7:33 AM

## 2023-07-26 NOTE — Anesthesia Procedure Notes (Signed)
 Procedure Name: Intubation Date/Time: 07/26/2023 7:54 AM  Performed by: Thomasene Ripple, CRNAPre-anesthesia Checklist: Patient identified, Emergency Drugs available, Suction available and Patient being monitored Patient Re-evaluated:Patient Re-evaluated prior to induction Oxygen Delivery Method: Circle System Utilized Preoxygenation: Pre-oxygenation with 100% oxygen Induction Type: IV induction Ventilation: Mask ventilation without difficulty Laryngoscope Size: Miller and 3 Grade View: Grade I Tube type: Oral Tube size: 8.0 mm Number of attempts: 1 Airway Equipment and Method: Stylet and Oral airway Placement Confirmation: ETT inserted through vocal cords under direct vision, positive ETCO2 and breath sounds checked- equal and bilateral Secured at: 24 cm Tube secured with: Tape Dental Injury: Teeth and Oropharynx as per pre-operative assessment

## 2023-07-26 NOTE — Op Note (Signed)
 Brief history: The patient is a 78 year old white male who has complained of back and right greater left leg pain consistent with lumbar radiculopathy/neurogenic claudication.  He has failed medical management and was worked up with lumbar x-rays and a lumbar MRI which demonstrated an L4-5 spondylolisthesis with severe spinal stenosis.  I discussed the various treatment options with him.  He has decided proceed with surgery.  Preoperative diagnosis: Lumbar spondylolisthesis, facet arthropathy, degenerative disc disease, spinal stenosis compressing both the L4 and the L5 nerve roots; lumbago; lumbar radiculopathy; neurogenic claudication  Postoperative diagnosis: The same  Procedure: Bilateral L4-5 laminotomy/foraminotomies/medial facetectomy to decompress the bilateral L4 and L5 nerve roots(the work required to do this was in addition to the work required to do the posterior lumbar interbody fusion because of the patient's spinal stenosis, facet arthropathy. Etc. requiring a wide decompression of the nerve roots.); right L4-5 transforaminal lumbar interbody fusion with local morselized autograft bone and Zimmer DBM; insertion of interbody prosthesis at L4-5 (globus peek expandable interbody prosthesis); posterior nonsegmental instrumentation from L4 to L5 with globus titanium pedicle screws and rods; posterior lateral arthrodesis at L4-5 with local morselized autograft bone and Zimmer DBM.  Surgeon: Dr. Delma Officer  Asst.: Dr. Lisbeth Renshaw and Hildred Priest, NP  Anesthesia: Gen. endotracheal  Estimated blood loss: 200 cc  Drains: None  Complications: Durotomy  Description of procedure: The patient was brought to the operating room by the anesthesia team. General endotracheal anesthesia was induced. The patient was turned to the prone position on the Wilson frame. The patient's lumbosacral region was then prepared with Betadine scrub and Betadine solution. Sterile drapes were applied.  I  then injected the area to be incised with Marcaine with epinephrine solution. I then used the scalpel to make a linear midline incision over the L4-5 interspace. I then used electrocautery to perform a bilateral subperiosteal dissection exposing the spinous process and lamina of L4-5. We then obtained intraoperative radiograph to confirm our location. We then inserted the Verstrac retractor to provide exposure.  I began the decompression by using the high speed drill to perform laminotomies at L4-5 bilaterally.  I dissected the ligamentum flavum free from the dura using the Penfield #4 and coronary dilator.  I did create neurotomy which I repaired with interrupted 6-0 Prolene sutures.  One of the small needles was lost on the Mayo stand.  I inspected the wound.  It was not in the wound.  We then used the Kerrison punches to widen the laminotomy and removed the ligamentum flavum at L4-5 bilaterally. We used the Kerrison punches to remove the medial facets at L4-5 bilaterally, we removed the right L4-5 facet. We performed wide foraminotomies about the bilateral L4 and L5 nerve roots completing the decompression.  We now turned our attention to the posterior lumbar interbody fusion. I used a scalpel to incise the intervertebral disc at L4-5 bilaterally. I then performed a partial intervertebral discectomy at L4-5 bilaterally using the pituitary forceps. We prepared the vertebral endplates at L4-5 bilaterally for the fusion by removing the soft tissues with the curettes. We then used the trial spacers to pick the appropriate sized interbody prosthesis. We prefilled his prosthesis with a combination of local morselized autograft bone that we obtained during the decompression as well as Zimmer DBM. We inserted the prefilled prosthesis into the interspace at L4-5 from the right, we then turned and expanded the prosthesis. There was a good snug fit of the prosthesis in the interspace. We then filled and  the remainder of  the intervertebral disc space with local morselized autograft bone and Zimmer DBM. This completed the posterior lumbar interbody arthrodesis.  During the decompression and insertion of the prosthesis the assistant protected the thecal sac and nerve roots with the D'Errico retractor.  We now turned attention to the instrumentation. Under fluoroscopic guidance we cannulated the bilateral L4 and L5 pedicles with the bone probe. We then removed the bone probe. We then tapped the pedicle with a 6.5 millimeter tap. We then removed the tap. We probed inside the tapped pedicle with a ball probe to rule out cortical breaches. We then inserted a 7.5 x 50 and 55 millimeter pedicle screw into the 4 and L5 pedicles bilaterally under fluoroscopic guidance. The right L4 screw was slightly cepalad. I did not adjust it because it had excellent bony purchase. We then palpated along the medial aspect of the pedicles to rule out cortical breaches. There were none. The nerve roots were not injured. We then connected the unilateral pedicle screws with a lordotic rod. We compressed the construct and secured the rod in place with the caps. We then tightened the caps appropriately. This completed the instrumentation from L4-5 bilaterally.  We now turned our attention to the posterior lateral arthrodesis at 4 5. We used the high-speed drill to decorticate the remainder of the facets, pars, transverse process at L4-5. We then applied a combination of local morselized autograft bone and Zimmer DBM over these decorticated posterior lateral structures. This completed the posterior lateral arthrodesis.  We then obtained hemostasis using bipolar electrocautery. We irrigated the wound out with vashe solution. We inspected the thecal sac and nerve roots and noted they were well decompressed. We then removed the retractor.  We injected Exparel . We reapproximated patient's thoracolumbar fascia with interrupted #1 Vicryl suture. We  reapproximated patient's subcutaneous tissue with interrupted 2-0 Vicryl suture. The reapproximated patient's skin with Steri-Strips and benzoin. The wound was then coated with bacitracin ointment. A sterile dressing was applied. The drapes were removed. The patient was subsequently returned to the supine position where they were extubated by the anesthesia team. He was then transported to the post anesthesia care unit in stable condition.  The needle count was incorrect because of the lost needle.  We obtained intraoperative x-ray which not demonstrate a retained needle, confirmed by the radiologist, nor did visual inspection of the wound.

## 2023-07-26 NOTE — Progress Notes (Signed)
 Orthopedic Tech Progress Note Patient Details:  Billy Carpenter 1946-05-21 [id number]  Ortho Devices Type of Ortho Device: Lumbar corsett Ortho Device/Splint Location: Back Ortho Device/Splint Interventions: Ordered      [name] A [name] 07/26/2023, 2:55 PM

## 2023-07-26 NOTE — Transfer of Care (Signed)
 Immediate Anesthesia Transfer of Care Note  Patient: Billy Carpenter  Procedure(s) Performed: POSTERIOR LUMBAR INTERBODY FUSION, INTERBODY PROSTHESIS,POSTERIOR INSTRUMENTATION LUMBAR FOUR TO LUMBAR FIVE  Patient Location: PACU   Anesthesia Type:General  Level of Consciousness: awake, alert , and oriented  Airway & Oxygen Therapy: Patient Spontanous Breathing and Patient connected to face mask oxygen  Post-op Assessment: Report given to RN and Post -op Vital signs reviewed and stable  Post vital signs: Reviewed and stable  Last Vitals:  Vitals Value Taken Time  BP 125/92 07/26/23 1224  Temp    Pulse 81 07/26/23 1226  Resp 13 07/26/23 1226  SpO2 99 % 07/26/23 1226  Vitals shown include unfiled device data.  Last Pain:  Vitals:   07/26/23 0616  TempSrc:   PainSc: 2       Patients Stated Pain Goal: 0 (07/26/23 0616)  Complications: No notable events documented.

## 2023-07-27 ENCOUNTER — Encounter (HOSPITAL_COMMUNITY): Payer: Self-pay | Admitting: Neurosurgery

## 2023-07-27 DIAGNOSIS — M48062 Spinal stenosis, lumbar region with neurogenic claudication: Secondary | ICD-10-CM | POA: Diagnosis not present

## 2023-07-27 MED ORDER — CYCLOBENZAPRINE HCL 5 MG PO TABS: 5.0000 mg | ORAL_TABLET | Freq: Three times a day (TID) | ORAL | Status: DC | PRN

## 2023-07-27 MED ORDER — CYCLOBENZAPRINE HCL 5 MG PO TABS: 5.0000 mg | ORAL_TABLET | Freq: Three times a day (TID) | ORAL | 0 refills | Status: DC | PRN

## 2023-07-27 MED ORDER — OXYCODONE-ACETAMINOPHEN 5-325 MG PO TABS
1.0000 | ORAL_TABLET | ORAL | Status: DC | PRN
  Administered 2023-07-27: 1 via ORAL
  Filled 2023-07-27: qty 1

## 2023-07-27 MED ORDER — DOCUSATE SODIUM 100 MG PO CAPS: 100.0000 mg | ORAL_CAPSULE | Freq: Two times a day (BID) | ORAL | 0 refills | Status: AC

## 2023-07-27 MED ORDER — OXYCODONE-ACETAMINOPHEN 5-325 MG PO TABS
1.0000 | ORAL_TABLET | ORAL | 0 refills | Status: DC | PRN
Start: 2023-07-27 — End: 2023-09-26

## 2023-07-27 MED FILL — Sodium Chloride IV Soln 0.9%: INTRAVENOUS | Qty: 1000 | Status: AC

## 2023-07-27 MED FILL — Thrombin For Soln 5000 Unit: CUTANEOUS | Qty: 5000 | Status: AC

## 2023-07-27 MED FILL — Heparin Sodium (Porcine) Inj 1000 Unit/ML: INTRAMUSCULAR | Qty: 30 | Status: AC

## 2023-07-27 NOTE — Progress Notes (Signed)
 Patient alert and oriented,ambulate, void. D/c instructions explain and given to the patient all questions answered. Surgical site clean and dry no sign of infection.

## 2023-07-27 NOTE — Evaluation (Signed)
 Physical Therapy Evaluation  Patient Details Name: VANDER KUEKER MRN: [id number] DOB: September 24, 1945 Today's Date: 07/27/2023  History of Present Illness  Pt is a 78 y/o male who presents s/p PLIF on 07/26/2023. PMH significant for BPH s/p surgery, CAD, CVA, a-fib, HTN, mitral regurgitation, PVD, syncope, hernia s/p repair, wrist surgery.  Clinical Impression  Pt admitted with above diagnosis. At the time of PT eval, pt was able to demonstrate transfers and ambulation with gross CGA to supervision for safety and RW for support. Pt was educated on precautions, brace application/wearing schedule, appropriate activity progression, and car transfer. Pt currently with functional limitations due to the deficits listed below (see PT Problem List). Pt will benefit from skilled PT to increase their independence and safety with mobility to allow discharge to the venue listed below.          If plan is discharge home, recommend the following: A little help with walking and/or transfers;A little help with bathing/dressing/bathroom;Assistance with cooking/housework;Help with stairs or ramp for entrance;Assist for transportation   Can travel by private vehicle        Equipment Recommendations Rolling walker (2 wheels);Other (comment) Public relations account executive - pt to purchase online)  Recommendations for Other Services       Functional Status Assessment Patient has had a recent decline in their functional status and demonstrates the ability to make significant improvements in function in a reasonable and predictable amount of time.     Precautions / Restrictions Precautions Precautions: Fall;Back Precaution Booklet Issued: Yes (comment) Recall of Precautions/Restrictions: Intact Precaution/Restrictions Comments: Reviewed handout and pt was cued for precautions during functional mobility. Required Braces or Orthoses: Spinal Brace Spinal Brace: Lumbar corset;Applied in sitting position Restrictions Weight Bearing  Restrictions Per Provider Order: No      Mobility  Bed Mobility Overal bed mobility: Needs Assistance Bed Mobility: Rolling, Sidelying to Sit Rolling: Supervision Sidelying to sit: Supervision       General bed mobility comments: VC's for log roll technique.    Transfers Overall transfer level: Needs assistance Equipment used: Rolling walker (2 wheels) Transfers: Sit to/from Stand Sit to Stand: Supervision, Contact guard assist           General transfer comment: VC's for hand placement on seated surface for safety. No assist required.    Ambulation/Gait Ambulation/Gait assistance: Contact guard assist Gait Distance (Feet): 300 Feet Assistive device: Rolling walker (2 wheels) Gait Pattern/deviations: Step-through pattern, Decreased stride length, Trunk flexed Gait velocity: Decreased Gait velocity interpretation: 1.31 - 2.62 ft/sec, indicative of limited community ambulator   General Gait Details: VC's throughout for improved posture, closer walker proximity, and forward gaze. No gross unsteadiness or LOB noted.  Stairs Stairs: Yes Stairs assistance: Contact guard assist Stair Management: Two rails, Step to pattern, Forwards Number of Stairs: 10 General stair comments: VC's for sequencing and general safety. No assist required but close guard provided for safety.  Wheelchair Mobility     Tilt Bed    Modified Rankin (Stroke Patients Only)       Balance Overall balance assessment: Needs assistance Sitting-balance support: Feet supported, No upper extremity supported Sitting balance-Leahy Scale: Fair     Standing balance support: During functional activity, Reliant on assistive device for balance, Bilateral upper extremity supported Standing balance-Leahy Scale: Poor                               Pertinent Vitals/Pain Pain Assessment Pain Assessment: Faces  Faces Pain Scale: Hurts little more Pain Location: back/incision site Pain  Descriptors / Indicators: Operative site guarding, Sore Pain Intervention(s): Limited activity within patient's tolerance, Monitored during session, Repositioned    Home Living Family/patient expects to be discharged to:: Private residence Living Arrangements: Spouse/significant other Available Help at Discharge: Family;Available 24 hours/day Type of Home: House Home Access: Stairs to enter Entrance Stairs-Rails: Right;Left;Can reach both Entrance Stairs-Number of Steps: 6 Alternate Level Stairs-Number of Steps: flight to guest bed/bath, and 2 stairs up/down throughout the house to get to kitchen, living room. Home Layout: Multi-level;Able to live on main level with bedroom/bathroom Home Equipment: Grab bars - tub/shower      Prior Function Prior Level of Function : Independent/Modified Independent                     Extremity/Trunk Assessment   Upper Extremity Assessment Upper Extremity Assessment: Defer to OT evaluation    Lower Extremity Assessment Lower Extremity Assessment: Generalized weakness;RLE deficits/detail RLE Deficits / Details: Decreased strength and AROM consistent with pre-op diagnosis    Cervical / Trunk Assessment Cervical / Trunk Assessment: Back Surgery  Communication   Communication Communication: No apparent difficulties    Cognition Arousal: Alert Behavior During Therapy: WFL for tasks assessed/performed   PT - Cognitive impairments: No apparent impairments                         Following commands: Intact       Cueing Cueing Techniques: Verbal cues     General Comments      Exercises     Assessment/Plan    PT Assessment Patient needs continued PT services  PT Problem List Decreased strength;Decreased activity tolerance;Decreased balance;Decreased mobility;Decreased knowledge of use of DME;Decreased safety awareness;Decreased knowledge of precautions;Pain       PT Treatment Interventions DME instruction;Gait  training;Stair training;Therapeutic activities;Therapeutic exercise;Balance training;Functional mobility training;Patient/family education    PT Goals (Current goals can be found in the Care Plan section)  Acute Rehab PT Goals Patient Stated Goal: Home today PT Goal Formulation: With patient/family Time For Goal Achievement: 08/03/23 Potential to Achieve Goals: Good    Frequency Min 1X/week     Co-evaluation               AM-PAC PT "6 Clicks" Mobility  Outcome Measure Help needed turning from your back to your side while in a flat bed without using bedrails?: A Little Help needed moving from lying on your back to sitting on the side of a flat bed without using bedrails?: A Little Help needed moving to and from a bed to a chair (including a wheelchair)?: A Little Help needed standing up from a chair using your arms (e.g., wheelchair or bedside chair)?: A Little Help needed to walk in hospital room?: A Little Help needed climbing 3-5 steps with a railing? : A Little 6 Click Score: 18    End of Session Equipment Utilized During Treatment: Gait belt;Back brace Activity Tolerance: Patient tolerated treatment well Patient left: in bed;with call bell/phone within reach;with family/visitor present Nurse Communication: Mobility status PT Visit Diagnosis: Unsteadiness on feet (R26.81);Pain Pain - part of body:  (back)    Time: 1191-4782 PT Time Calculation (min) (ACUTE ONLY): 30 min   Charges:   PT Evaluation $PT Eval Low Complexity: 1 Low PT Treatments $Gait Training: 8-22 mins PT General Charges $$ ACUTE PT VISIT: 1 Visit         Vernona Rieger  Jiles Prows, DPT Acute Rehabilitation Services Secure Chat Preferred Office: [phone]   Marylynn Pearson 07/27/2023, 12:54 PM

## 2023-07-27 NOTE — Discharge Summary (Signed)
 Physician Discharge Summary  Patient ID: Billy Carpenter MRN: [id number] DOB/AGE: March 15, 1946 78 y.o.  Admit date: 07/26/2023 Discharge date: 07/27/2023  Admission Diagnoses: Lumbar spondylolisthesis, lumbar spondylosis, lumbar facet arthropathy, lumbar radiculopathy, neurogenic claudication, lumbago  Discharge Diagnoses: The same Principal Problem:   Spondylolisthesis of lumbar region   Discharged Condition: good  Hospital Course: I performed an L4-5 decompression, instrumentation and fusion on the patient on 07/26/2023.  The surgery went well.  The patient's postoperative course was unremarkable.  On postoperative day #1 he felt well and requested discharge to home.  The patient, and his wife, were given verbal and written discharge instructions.  All their questions were answered.  He was instructed to resume his Xarelto on 07/29/2023.  Consults: PT, care management Significant Diagnostic Studies: None Treatments: L4-5 decompression, instrumentation and fusion. Discharge Exam: Blood pressure 105/70, pulse 69, temperature 97.9 F (36.6 C), temperature source Oral, resp. rate 20, height 5\' 7"  (1.702 m), weight 66.9 kg, SpO2 97%. The patient is alert and pleasant.  He looks well.  His strength is normal.  Disposition: Home  Discharge Instructions     Call MD for:  difficulty breathing, headache or visual disturbances   Complete by: As directed    Call MD for:  extreme fatigue   Complete by: As directed    Call MD for:  hives   Complete by: As directed    Call MD for:  persistant dizziness or light-headedness   Complete by: As directed    Call MD for:  persistant nausea and vomiting   Complete by: As directed    Call MD for:  redness, tenderness, or signs of infection (pain, swelling, redness, odor or green/yellow discharge around incision site)   Complete by: As directed    Call MD for:  severe uncontrolled pain   Complete by: As directed    Call MD for:  temperature >100.4    Complete by: As directed    Diet - low sodium heart healthy   Complete by: As directed    Discharge instructions   Complete by: As directed    Call [phone] for a followup appointment. Take a stool softener while you are using pain medications.   Driving Restrictions   Complete by: As directed    Do not drive for 2 weeks.   Increase activity slowly   Complete by: As directed    Lifting restrictions   Complete by: As directed    Do not lift more than 5 pounds. No excessive bending or twisting.   May shower / Bathe   Complete by: As directed    Remove the dressing for 3 days after surgery.  You may shower, but leave the incision alone.   Remove dressing in 48 hours   Complete by: As directed       Allergies as of 07/27/2023       Reactions   Shellfish Allergy Anaphylaxis        Medication List     STOP taking these medications    naproxen sodium 220 MG tablet Commonly known as: ALEVE       TAKE these medications    atorvastatin 20 MG tablet Commonly known as: LIPITOR Take 1 tablet (20 mg total) by mouth daily.   cyclobenzaprine 5 MG tablet Commonly known as: FLEXERIL Take 1 tablet (5 mg total) by mouth 3 (three) times daily as needed for muscle spasms.   diltiazem 120 MG 24 hr capsule Commonly known as: CARDIZEM CD Take  1 capsule (120 mg total) by mouth daily.   docusate sodium 100 MG capsule Commonly known as: COLACE Take 1 capsule (100 mg total) by mouth 2 (two) times daily.   flecainide 50 MG tablet Commonly known as: TAMBOCOR Take 1 tablet by mouth twice daily   MAGNESIUM CITRATE PO Take 500 mg by mouth daily.   multivitamin with minerals Tabs tablet Take 1 tablet by mouth daily.   oxyCODONE-acetaminophen 5-325 MG tablet Commonly known as: PERCOCET/ROXICET Take 1-2 tablets by mouth every 4 (four) hours as needed for moderate pain (pain score 4-6).   rivaroxaban 20 MG Tabs tablet Commonly known as: Xarelto Take 1 tablet (20 mg total) by  mouth daily with supper.   Tiadylt ER 120 MG 24 hr capsule Generic drug: diltiazem Take 120 mg by mouth daily.   vitamin D3 50 MCG (2000 UT) Caps Take 4,000 Units by mouth daily.         Signed: Cristi Loron 07/27/2023, 7:48 AM

## 2023-07-30 ENCOUNTER — Ambulatory Visit: Payer: Medicare Other

## 2023-07-30 ENCOUNTER — Encounter: Payer: Self-pay | Admitting: Internal Medicine

## 2023-07-30 DIAGNOSIS — R55 Syncope and collapse: Secondary | ICD-10-CM | POA: Diagnosis not present

## 2023-07-30 LAB — CUP PACEART REMOTE DEVICE CHECK
Date Time Interrogation Session: 20250302230104
Implantable Pulse Generator Implant Date: 20201211

## 2023-08-03 NOTE — Progress Notes (Signed)
 [facility] Summary Report / Loop Recorder

## 2023-08-13 ENCOUNTER — Other Ambulatory Visit: Payer: Self-pay | Admitting: *Deleted

## 2023-08-13 ENCOUNTER — Encounter: Payer: Self-pay | Admitting: Internal Medicine

## 2023-08-13 DIAGNOSIS — I480 Paroxysmal atrial fibrillation: Secondary | ICD-10-CM

## 2023-08-13 MED ORDER — RIVAROXABAN 20 MG PO TABS
20.0000 mg | ORAL_TABLET | Freq: Every day | ORAL | 1 refills | Status: DC
Start: 2023-08-13 — End: 2023-12-19

## 2023-08-13 MED ORDER — FLECAINIDE ACETATE 50 MG PO TABS: 50.0000 mg | ORAL_TABLET | Freq: Two times a day (BID) | ORAL | 3 refills | Status: DC

## 2023-08-13 MED ORDER — RIVAROXABAN 20 MG PO TABS: 20.0000 mg | ORAL_TABLET | Freq: Every day | ORAL | 1 refills | Status: DC

## 2023-08-13 MED ORDER — DILTIAZEM HCL ER COATED BEADS 120 MG PO CP24: 120.0000 mg | ORAL_CAPSULE | Freq: Every day | ORAL | 3 refills | Status: DC

## 2023-08-13 NOTE — Telephone Encounter (Signed)
 Prescription refill request for Xarelto received.  Indication: PAF Last office visit: 06/26/23  Odessa Fleming MD Weight: 78.2NF Age: 78 Scr: 1.06 on 07/12/23 CrCl: 54.99 Based on above findings Xarelto 20mg  daily is the appropriate dose.  Refill approved.

## 2023-08-20 ENCOUNTER — Other Ambulatory Visit: Payer: Self-pay | Admitting: Internal Medicine

## 2023-08-20 MED ORDER — DILTIAZEM HCL ER COATED BEADS 120 MG PO CP24: 120.0000 mg | ORAL_CAPSULE | Freq: Every day | ORAL | Status: DC

## 2023-08-20 MED ORDER — FLECAINIDE ACETATE 50 MG PO TABS: 50.0000 mg | ORAL_TABLET | Freq: Two times a day (BID) | ORAL | Status: DC

## 2023-08-20 MED ORDER — DILTIAZEM HCL ER COATED BEADS 120 MG PO CP24: 120.0000 mg | ORAL_CAPSULE | Freq: Every day | ORAL | 2 refills | Status: DC

## 2023-08-20 MED ORDER — DILTIAZEM HCL ER COATED BEADS 120 MG PO CP24: 120.0000 mg | ORAL_CAPSULE | Freq: Every day | ORAL | 3 refills | Status: DC

## 2023-08-20 MED ORDER — FLECAINIDE ACETATE 50 MG PO TABS: 50.0000 mg | ORAL_TABLET | Freq: Two times a day (BID) | ORAL | 2 refills | Status: DC

## 2023-08-20 MED ORDER — FLECAINIDE ACETATE 50 MG PO TABS: 50.0000 mg | ORAL_TABLET | Freq: Two times a day (BID) | ORAL | 1 refills | Status: DC

## 2023-08-20 NOTE — Addendum Note (Signed)
 Addended by: Alois Cliche on: 08/20/2023 05:56 PM   Modules accepted: Orders

## 2023-08-22 ENCOUNTER — Other Ambulatory Visit: Payer: Self-pay | Admitting: Internal Medicine

## 2023-08-31 NOTE — Progress Notes (Signed)
 [facility] Summary Report / Loop Recorder

## 2023-09-03 ENCOUNTER — Ambulatory Visit (INDEPENDENT_AMBULATORY_CARE_PROVIDER_SITE_OTHER): Payer: Medicare Other

## 2023-09-03 DIAGNOSIS — I480 Paroxysmal atrial fibrillation: Secondary | ICD-10-CM | POA: Diagnosis not present

## 2023-09-04 LAB — CUP PACEART REMOTE DEVICE CHECK
Date Time Interrogation Session: 20250406230128
Implantable Pulse Generator Implant Date: 20201211

## 2023-09-12 ENCOUNTER — Encounter: Payer: Self-pay | Admitting: Internal Medicine

## 2023-09-15 ENCOUNTER — Encounter: Payer: Self-pay | Admitting: Internal Medicine

## 2023-09-26 ENCOUNTER — Encounter: Payer: Self-pay | Admitting: Cardiovascular Disease

## 2023-09-26 ENCOUNTER — Ambulatory Visit: Payer: Medicare Other | Attending: Cardiovascular Disease | Admitting: Cardiovascular Disease

## 2023-09-26 VITALS — BP 136/80 | HR 72 | Ht 67.5 in | Wt 147.4 lb

## 2023-09-26 DIAGNOSIS — I480 Paroxysmal atrial fibrillation: Secondary | ICD-10-CM | POA: Diagnosis not present

## 2023-09-26 NOTE — Patient Instructions (Addendum)
 Medication Instructions:  Your physician recommends that you continue on your current medications as directed. Please refer to the Current Medication list given to you today. *If you need a refill on your cardiac medications before your next appointment, please call your pharmacy*  Lab Work: CBC and BMET - please have pre-procedure lab work completed on Monday, June 30 . This can be done at ANY [facility] near you - no appointment required and this does not have to be fasting. If you have labs (blood work) drawn today and your tests are completely normal, you will receive your results only by: MyChart Message (if you have MyChart) OR A paper copy in the mail If you have any lab test that is abnormal or we need to change your treatment, we will call you to review the results.  Testing/Procedures: Cardiac CT - someone will contact you to schedule this  Your physician has requested that you have cardiac CT. Cardiac computed tomography (CT) is a painless test that uses an x-ray machine to take clear, detailed pictures of your heart. For further information please visit [email or web address] Please follow instruction sheet as given.   Atrial Fibrillation Ablation - scheduled on Monday, July 28.  We will be in contact closer to your ablation date with further instructions Your physician has recommended that you have an ablation. Catheter ablation is a medical procedure used to treat some cardiac arrhythmias (irregular heartbeats). During catheter ablation, a long, thin, flexible tube is put into a blood vessel in your groin (upper thigh), or neck. This tube is called an ablation catheter. It is then guided to your heart through the blood vessel. Radio frequency waves destroy small areas of heart tissue where abnormal heartbeats may cause an arrhythmia to start. Please see the instruction sheet given to you today.  Follow-Up: At [facility], you and your health needs are our priority.  As part of  our continuing mission to provide you with exceptional heart care, our providers are all part of one team.  This team includes your primary Cardiologist (physician) and Advanced Practice Providers or APPs (Physician Assistants and Nurse Practitioners) who all work together to provide you with the care you need, when you need it.  Your next appointment:   We will schedule follow up after your ablation  Provider:   Marlane Silver, MD   Cardiac Ablation Cardiac ablation is a procedure to destroy, or ablate, a small amount of heart tissue that is causing problems. The heart has many electrical connections. Sometimes, these connections are abnormal and can cause the heart to beat very fast or irregularly. Ablating the abnormal areas can improve the heart's rhythm or return it to normal. Ablation may be done for people who: Have irregular or rapid heartbeats (arrhythmias). Have Wolff-Parkinson-White syndrome. Have taken medicines for an arrhythmia that did not work or caused side effects. Have a high-risk heartbeat that may be life-threatening. Tell a health care provider about: Any allergies you have. All medicines you are taking, including vitamins, herbs, eye drops, creams, and over-the-counter medicines. Any problems you or family members have had with anesthesia. Any bleeding problems you have. Any surgeries you have had. Any medical conditions you have. Whether you are pregnant or may be pregnant. What are the risks? Your health care provider will talk with you about risks. These may include: Infection. Bruising and bleeding. Stroke or blood clots. Damage to nearby structures or organs. Allergic reaction to medicines or dyes. Needing a pacemaker if the heart gets  damaged. A pacemaker is a device that helps the heart beat normally. Failure of the procedure. A repeat procedure may be needed. What happens before the procedure? Medicines Ask your health care provider about: Changing  or stopping your regular medicines. These include any heart rhythm medicines, diabetes medicines, or blood thinners you take. Taking medicines such as aspirin and ibuprofen. These medicines can thin your blood. Do not take them unless your health care provider tells you to. Taking over-the-counter medicines, vitamins, herbs, and supplements. General instructions Follow instructions from your health care provider about what you may eat and drink. If you will be going home right after the procedure, plan to have a responsible adult: Take you home from the hospital or clinic. You will not be allowed to drive. Care for you for the time you are told. Ask your health care provider what steps will be taken to prevent infection. What happens during the procedure?  An IV will be inserted into one of your veins. You may be given: A sedative. This helps you relax. Anesthesia. This will: Numb certain areas of your body. An incision will be made in your neck or your groin. A needle will be inserted through the incision and into a large vein in your neck or groin. The small, thin tube (catheter) will be inserted through the needle and moved to your heart. A type of X-ray (fluoroscopy) will be used to help guide the catheter and provide images of the heart on a monitor. Dye may be injected through the catheter to help your surgeon see the area of the heart that needs treatment. Electrical currents will be sent from the catheter to destroy heart tissue in certain areas. There are three types of energy that may be used to do this: Heat (radiofrequency energy). Laser energy. Extreme cold (cryoablation). When the tissue has been destroyed, the catheter will be removed. Pressure will be held on the insertion area to prevent bleeding. A bandage (dressing) will be placed over the insertion area. The procedure may vary among health care providers and hospitals. What happens after the procedure? Your blood  pressure, heart rate and rhythm, breathing rate, and blood oxygen level will be monitored until you leave the hospital or clinic. Your insertion area will be checked for bleeding. You will need to lie still for a few hours. If your groin was used, you will need to keep your leg straight for a few hours after the catheter is removed. This information is not intended to replace advice given to you by your health care provider. Make sure you discuss any questions you have with your health care provider. Document Revised: 11/01/2021 Document Reviewed: 11/01/2021 Elsevier Patient Education  2024 [employer].

## 2023-09-26 NOTE — Progress Notes (Signed)
 Electrophysiology Office Note:    Date:  09/26/2023   ID:  Billy Carpenter, DOB 1946/05/29, MRN [id number]  PCP:  Allene Ivan, MD   [facility] Providers Cardiologist:  None Electrophysiologist:  Efraim Grange, MD     Referring MD: Allene Ivan, MD   History of Present Illness:    Billy Carpenter is a 78 y.o. male with a medical history significant for atrial fibrillation, recurrent syncope, orthostatic hypotension, nonsustained VT, stroke, and carotid artery dissection. a former patient of Dr. Rodolfo Clan, he presents today for follow-up.      Discussed the use of AI scribe software for clinical note transcription with the patient, who gave verbal consent to proceed.  History of Present Illness Billy Carpenter is a 78 year old male with recurrent syncope and atrial fibrillation who presents for follow-up and consideration of ablation. Referred by Dr. Rodolfo Clan for management of atrial fibrillation and consideration of ablation.  He has a history of recurrent syncope and orthostatic hypotension, with two significant episodes of passing out. One episode occurred while driving, which he attributes to metoprolol -induced hypotension. Metoprolol  has been discontinued due to these side effects.  He has a history of atrial fibrillation, initially diagnosed in 2018 following an episode of vertigo. During episodes, he experiences palpitations and rapid heart rates, with average rates of 158-164 bpm and maximum rates reaching up to 188 bpm. He is currently on flecainide  for rhythm control.  A loop recorder was installed in 2021 to monitor his heart rhythm, revealing multiple episodes of narrow complex tachycardia. He finds the monthly reports from the device helpful in tracking his condition.  He has a history of stroke and carotid artery dissection, with a secondary hypercoagulable state. His CHADS2VASc score is 7, indicating a high risk of stroke.  He underwent back surgery in  February 2025 for L4 and L5 and is currently recovering from pinched nerve pain, particularly on one side. He reports no pain while lying flat but experiences discomfort when standing.         Today, He reports he is at baseline and has no new complaints.  EKGs/Labs/Other Studies Reviewed Today:     Echocardiogram:  TTE June 26, 2017 LVEF 50%.  Mild LVH.  Grade 1 diastolic dysfunction.  Severe left atrial dilation.    Cardiac catherization  January 2019 Results reviewed in epic  EKG:   EKG Interpretation Date/Time:  Wednesday September 26 2023 09:50:51 EDT Ventricular Rate:  72 PR Interval:  238 QRS Duration:  84 QT Interval:  404 QTC Calculation: 442 R Axis:   2  Text Interpretation: Sinus rhythm with sinus arrhythmia with 1st degree A-V block When compared with ECG of 26-Jun-2023 09:21, No significant change was found Confirmed by Marlane Silver [phone]) on 09/26/2023 10:12:10 AM     Physical Exam:    VS:  BP 136/80   Pulse 72   Ht 5' 7.5" (1.715 m)   Wt 147 lb 6.4 oz (66.9 kg)   SpO2 98%   BMI 22.75 kg/m     Wt Readings from Last 3 Encounters:  09/26/23 147 lb 6.4 oz (66.9 kg)  07/26/23 147 lb 6.4 oz (66.9 kg)  07/12/23 152 lb 11.2 oz (69.3 kg)     GEN: Well nourished, well developed in no acute distress CARDIAC: RRR, no murmurs, rubs, gallops RESPIRATORY:  Normal work of breathing MUSCULOSKELETAL: no edema    ASSESSMENT & PLAN:     Paroxysmal atrial fibrillation Loop  recorder interrogation today shows multiple brief episodes of narrow complex tachycardia --likely pulmonary vein tachycardia, brief atrial fibrillation He is symptomatic with palpitations and fatigue Rates are not at all well-controlled, up to 160 bpm median rate We discussed management options.  Using a shared decision making approach, we opted to schedule A-fib ablation  We discussed the indication, rationale, logistics, anticipated benefits, and potential risks of the ablation  procedure including but not limited to -- bleed at the groin access site, chest pain, damage to nearby organs such as the diaphragm, lungs, or esophagus, need for a drainage tube, or prolonged hospitalization. I explained that the risk for stroke, heart attack, need for open chest surgery, or even death is very low but not zero. he  expressed understanding and wishes to proceed.   Recurrent syncope No definitive cause identified on loop recorder  Secondary hypercoagulable state CHA2DS2-VASc score 7      Signed, Efraim Grange, MD  09/26/2023 4:46 PM    [facility]

## 2023-09-28 ENCOUNTER — Encounter: Payer: Self-pay | Admitting: Cardiovascular Disease

## 2023-09-28 NOTE — Telephone Encounter (Signed)
 Spoke with patient, made aware of Dr Katheryne Pane comments. Patient thankful for the follow up. No needs at this time   Mealor, Augustus E, MD to [facility] Triage  Me  AM   09/28/23  4:10 PM Low oxygen could certainly trigger an AF episode, and you may not tolerate it very well at altitude

## 2023-10-08 ENCOUNTER — Ambulatory Visit (INDEPENDENT_AMBULATORY_CARE_PROVIDER_SITE_OTHER): Payer: Medicare Other

## 2023-10-08 DIAGNOSIS — R55 Syncope and collapse: Secondary | ICD-10-CM

## 2023-10-09 LAB — CUP PACEART REMOTE DEVICE CHECK
Date Time Interrogation Session: 20250511231007
Implantable Pulse Generator Implant Date: 20201211

## 2023-10-12 ENCOUNTER — Ambulatory Visit: Payer: Self-pay | Admitting: Cardiovascular Disease

## 2023-10-23 NOTE — Addendum Note (Signed)
 Addended by: Edra Govern D on: 10/23/2023 01:00 PM   Modules accepted: Orders

## 2023-10-23 NOTE — Progress Notes (Signed)
 [facility] Summary Report / Loop Recorder

## 2023-10-24 ENCOUNTER — Encounter: Payer: Self-pay | Admitting: Internal Medicine

## 2023-10-24 NOTE — Telephone Encounter (Signed)
 Haven't received forms yet but will keep an eye out for them

## 2023-10-24 NOTE — Telephone Encounter (Signed)
 Please review and advise.

## 2023-10-25 DIAGNOSIS — Z0279 Encounter for issue of other medical certificate: Secondary | ICD-10-CM

## 2023-10-25 NOTE — Telephone Encounter (Signed)
 Received the DMV form today.  Patient will be coming in this afternoon to sign the release of information and pay the $29 form fee.  I will place form in Dr. Katheryne Pane box.

## 2023-11-01 NOTE — Telephone Encounter (Signed)
 Spoke with patient, paperwork at front desk for pickup

## 2023-11-08 ENCOUNTER — Ambulatory Visit

## 2023-11-08 DIAGNOSIS — R55 Syncope and collapse: Secondary | ICD-10-CM

## 2023-11-08 LAB — CUP PACEART REMOTE DEVICE CHECK
Date Time Interrogation Session: 20250611230812
Implantable Pulse Generator Implant Date: 20201211

## 2023-11-10 ENCOUNTER — Ambulatory Visit: Payer: Self-pay | Admitting: Cardiovascular Disease

## 2023-11-21 ENCOUNTER — Other Ambulatory Visit (HOSPITAL_COMMUNITY): Payer: Self-pay | Admitting: Neurosurgery

## 2023-11-21 DIAGNOSIS — M4316 Spondylolisthesis, lumbar region: Secondary | ICD-10-CM

## 2023-11-24 ENCOUNTER — Encounter (HOSPITAL_COMMUNITY): Payer: Self-pay | Admitting: Interventional Radiology

## 2023-11-26 LAB — BASIC METABOLIC PANEL WITH GFR
BUN/Creatinine Ratio: 16 (ref 10–24)
BUN: 16 mg/dL (ref 8–27)
CO2: 22 mmol/L (ref 20–29)
Calcium: 9.5 mg/dL (ref 8.6–10.2)
Chloride: 101 mmol/L (ref 96–106)
Creatinine, Ser: 1.02 mg/dL (ref 0.76–1.27)
Glucose: 100 mg/dL — ABNORMAL HIGH (ref 70–99)
Potassium: 4.2 mmol/L (ref 3.5–5.2)
Sodium: 142 mmol/L (ref 134–144)
eGFR: 75 mL/min/{1.73_m2} (ref >59)

## 2023-11-26 LAB — CBC
Hematocrit: 47.6 % (ref 37.5–51.0)
Hemoglobin: 15.6 g/dL (ref 13.0–17.7)
MCH: 32.3 pg (ref 26.6–33.0)
MCHC: 32.8 g/dL (ref 31.5–35.7)
MCV: 99 fL — ABNORMAL HIGH (ref 79–97)
Platelets: 204 10*3/uL (ref 150–450)
RBC: 4.83 x10E6/uL (ref 4.14–5.80)
RDW: 12.6 % (ref 11.6–15.4)
WBC: 6.6 10*3/uL (ref 3.4–10.8)

## 2023-11-26 NOTE — Progress Notes (Signed)
 [facility] Summary Report / Loop Recorder

## 2023-11-26 NOTE — Addendum Note (Signed)
 Addended by: TAWNI DRILLING D on: 11/26/2023 05:40 PM   Modules accepted: Orders

## 2023-11-27 ENCOUNTER — Ambulatory Visit: Payer: Self-pay | Admitting: Cardiovascular Disease

## 2023-11-28 ENCOUNTER — Ambulatory Visit (HOSPITAL_COMMUNITY)
Admission: RE | Admit: 2023-11-28 | Discharge: 2023-11-28 | Disposition: A | Discharge status: Home / Self Care | Source: Ambulatory Visit | Attending: Neurosurgery | Admitting: Neurosurgery

## 2023-11-28 DIAGNOSIS — M4316 Spondylolisthesis, lumbar region: Secondary | ICD-10-CM | POA: Insufficient documentation

## 2023-12-03 ENCOUNTER — Ambulatory Visit (HOSPITAL_COMMUNITY)
Admission: RE | Admit: 2023-12-03 | Discharge: 2023-12-03 | Disposition: A | Discharge status: Home / Self Care | Source: Ambulatory Visit | Attending: Cardiovascular Disease

## 2023-12-03 DIAGNOSIS — I3481 Nonrheumatic mitral (valve) annulus calcification: Secondary | ICD-10-CM | POA: Insufficient documentation

## 2023-12-03 DIAGNOSIS — Z95818 Presence of other cardiac implants and grafts: Secondary | ICD-10-CM | POA: Diagnosis not present

## 2023-12-03 DIAGNOSIS — I700 Atherosclerosis of aorta: Secondary | ICD-10-CM | POA: Diagnosis not present

## 2023-12-03 DIAGNOSIS — I480 Paroxysmal atrial fibrillation: Secondary | ICD-10-CM | POA: Diagnosis present

## 2023-12-03 DIAGNOSIS — R918 Other nonspecific abnormal finding of lung field: Secondary | ICD-10-CM | POA: Diagnosis not present

## 2023-12-03 MED ORDER — IOHEXOL 350 MG/ML SOLN
100.0000 mL | Freq: Once | INTRAVENOUS | Status: AC | PRN
  Administered 2023-12-03: 100 mL via INTRAVENOUS

## 2023-12-04 ENCOUNTER — Telehealth: Payer: Self-pay | Admitting: Cardiovascular Disease

## 2023-12-04 NOTE — Telephone Encounter (Signed)
 Spoke to Dianne at [facility]. Wants provider to review impression noted in addendum from 12/03/23.  IMPRESSION: 1.  No pneumonia, pulmonary edema, or pleural effusion. 2. Unchanged pulmonary nodules present in both lower lobes, as described above. Given the stability, these are likely benign. Continued follow-up in 18-24 months is recommended, as documented below.   Non-contrast chest CT at 3-6 months is recommended. If the nodules are stable at time of repeat CT, then future CT at 18-24 months (from today's scan) is considered optional for low-risk patients, but is recommended for high-risk patients. This recommendation follows the consensus statement: Guidelines for Management of Incidental Pulmonary Nodules Detected on CT Images: From the Fleischner Society 2017; Radiology 2017; 284:228-243.  Will send to Dr. Nancey to review.  Josie

## 2023-12-04 NOTE — Telephone Encounter (Signed)
 New Messase:     Billy Carpenter is calling with a Call Report.

## 2023-12-10 ENCOUNTER — Ambulatory Visit (INDEPENDENT_AMBULATORY_CARE_PROVIDER_SITE_OTHER)

## 2023-12-10 DIAGNOSIS — R55 Syncope and collapse: Secondary | ICD-10-CM | POA: Diagnosis not present

## 2023-12-10 LAB — CUP PACEART REMOTE DEVICE CHECK
Date Time Interrogation Session: 20250713231553
Implantable Pulse Generator Implant Date: 20201211

## 2023-12-13 ENCOUNTER — Other Ambulatory Visit: Payer: Self-pay | Admitting: Neurosurgery

## 2023-12-13 ENCOUNTER — Encounter: Payer: Self-pay | Admitting: Neurosurgery

## 2023-12-13 DIAGNOSIS — M4316 Spondylolisthesis, lumbar region: Secondary | ICD-10-CM

## 2023-12-18 ENCOUNTER — Telehealth (HOSPITAL_COMMUNITY): Payer: Self-pay

## 2023-12-18 NOTE — Addendum Note (Signed)
 Addended by: Cortlin Marano O on: 12/18/2023 03:44 PM   Modules accepted: Orders

## 2023-12-18 NOTE — Telephone Encounter (Signed)
 Patient returned call to discuss upcoming procedure.   CT: completed.  Labs: completed.   Any recent signs of acute illness or been started on antibiotics? No Any new medications started? No Any medications to hold? No  Any missed doses of blood thinner? No  Advised patient to continue taking ANTICOAGULANT: Xarelto  (Rivaroxaban )  daily without missing any doses.  Medication instructions:  On the morning of your procedure DO NOT take any medication., including Xarelto  or the procedure may be rescheduled. Nothing to eat or drink after midnight prior to your procedure.  Confirmed patient is scheduled for Atrial Fibrillation Ablation on Monday, July 28 with Dr. Eulas Furbish. Instructed patient to arrive at the Main Entrance A at [facility]: [address] [place], [place] [address] and check in at Admitting at 5:30 AM.  Advised of plan to go home the same day and will only stay overnight if medically necessary. You MUST have a responsible adult to drive you home and MUST be with you the first 24 hours after you arrive home or your procedure could be cancelled.  Patient verbalized understanding to all instructions provided and agreed to proceed with procedure.

## 2023-12-18 NOTE — Telephone Encounter (Signed)
 Attempted to reach patient to discuss upcoming procedure, no answer. Left VM for patient to return call.

## 2023-12-19 ENCOUNTER — Ambulatory Visit: Payer: Self-pay | Admitting: Cardiovascular Disease

## 2023-12-21 NOTE — Pre-Procedure Instructions (Signed)
 Instructed patient on the following items: Arrival time 0515 Nothing to eat or drink after midnight No meds AM of procedure Responsible person to drive you home and stay with you for 24 hrs  Have you missed any doses of anti-coagulant Xarelto - takes  once a day, hasn't missed any doses.  Don't take morning of procedure.

## 2023-12-23 NOTE — Anesthesia Preprocedure Evaluation (Addendum)
 Anesthesia Evaluation  Patient identified by MRN, date of birth, ID band Patient awake    Reviewed: Allergy & Precautions, NPO status , Patient's Chart, lab work & pertinent test results  History of Anesthesia Complications Negative for: history of anesthetic complications  Airway Mallampati: II  TM Distance: >3 FB Neck ROM: Full    Dental no notable dental hx.    Pulmonary neg pulmonary ROS   Pulmonary exam normal breath sounds clear to auscultation       Cardiovascular hypertension, Pt. on medications + Peripheral Vascular Disease  (-) Past MI Normal cardiovascular exam+ dysrhythmias (on Xarelto ) Atrial Fibrillation  Rhythm:Regular Rate:Normal  TTE 2020: EF 55-60%, grade I DD, mild RVE, mild LAE/RAE, valves ok, mild to moderate dilatation of aortic root measuring 44mm    Neuro/Psych CVA (2020), No Residual Symptoms    GI/Hepatic negative GI ROS, Neg liver ROS,,,  Endo/Other  negative endocrine ROS    Renal/GU negative Renal ROS     Musculoskeletal  (+) Arthritis ,    Abdominal   Peds  Hematology negative hematology ROS (+)   Anesthesia Other Findings Day of surgery medications reviewed with patient.  Reproductive/Obstetrics                              Anesthesia Physical Anesthesia Plan  ASA: 3  Anesthesia Plan: General   Post-op Pain Management: Minimal or no pain anticipated   Induction: Intravenous  PONV Risk Score and Plan: 2 and Treatment may vary due to age or medical condition, Ondansetron  and Dexamethasone   Airway Management Planned: Oral ETT  Additional Equipment: None  Intra-op Plan:   Post-operative Plan: Extubation in OR  Informed Consent:   Plan Discussed with:   Anesthesia Plan Comments:          Anesthesia Quick Evaluation

## 2023-12-24 ENCOUNTER — Ambulatory Visit (HOSPITAL_COMMUNITY)
Admission: RE | Disposition: A | Discharge status: Home / Self Care | Payer: Self-pay | Source: Home / Self Care | Attending: Cardiovascular Disease

## 2023-12-24 ENCOUNTER — Ambulatory Visit (HOSPITAL_COMMUNITY): Payer: Self-pay | Admitting: Anesthesiology

## 2023-12-24 ENCOUNTER — Ambulatory Visit (HOSPITAL_COMMUNITY)
Admission: RE | Admit: 2023-12-24 | Discharge: 2023-12-24 | Disposition: A | Discharge status: Home / Self Care | Attending: Cardiovascular Disease | Admitting: Cardiovascular Disease

## 2023-12-24 ENCOUNTER — Encounter (HOSPITAL_COMMUNITY): Payer: Self-pay | Admitting: Cardiovascular Disease

## 2023-12-24 ENCOUNTER — Other Ambulatory Visit: Payer: Self-pay

## 2023-12-24 DIAGNOSIS — Z8673 Personal history of transient ischemic attack (TIA), and cerebral infarction without residual deficits: Secondary | ICD-10-CM | POA: Diagnosis not present

## 2023-12-24 DIAGNOSIS — I4891 Unspecified atrial fibrillation: Secondary | ICD-10-CM

## 2023-12-24 DIAGNOSIS — I480 Paroxysmal atrial fibrillation: Secondary | ICD-10-CM

## 2023-12-24 DIAGNOSIS — Z79899 Other long term (current) drug therapy: Secondary | ICD-10-CM | POA: Diagnosis not present

## 2023-12-24 DIAGNOSIS — I110 Hypertensive heart disease with heart failure: Secondary | ICD-10-CM

## 2023-12-24 DIAGNOSIS — I10 Essential (primary) hypertension: Secondary | ICD-10-CM | POA: Insufficient documentation

## 2023-12-24 DIAGNOSIS — E785 Hyperlipidemia, unspecified: Secondary | ICD-10-CM | POA: Diagnosis not present

## 2023-12-24 DIAGNOSIS — Z95818 Presence of other cardiac implants and grafts: Secondary | ICD-10-CM | POA: Diagnosis not present

## 2023-12-24 DIAGNOSIS — D6869 Other thrombophilia: Secondary | ICD-10-CM | POA: Diagnosis not present

## 2023-12-24 DIAGNOSIS — R55 Syncope and collapse: Secondary | ICD-10-CM | POA: Insufficient documentation

## 2023-12-24 DIAGNOSIS — I739 Peripheral vascular disease, unspecified: Secondary | ICD-10-CM | POA: Diagnosis not present

## 2023-12-24 DIAGNOSIS — Z7901 Long term (current) use of anticoagulants: Secondary | ICD-10-CM | POA: Insufficient documentation

## 2023-12-24 DIAGNOSIS — I5020 Unspecified systolic (congestive) heart failure: Secondary | ICD-10-CM

## 2023-12-24 HISTORY — PX: ATRIAL FIBRILLATION ABLATION: EP1191

## 2023-12-24 LAB — POCT ACTIVATED CLOTTING TIME: Activated Clotting Time: 331 s

## 2023-12-24 MED ORDER — SODIUM CHLORIDE 0.9 % IV SOLN: INTRAVENOUS | Status: DC

## 2023-12-24 MED ORDER — SODIUM CHLORIDE 0.9% FLUSH
3.0000 mL | INTRAVENOUS | Status: DC | PRN
Start: 2023-12-24 — End: 2023-12-24

## 2023-12-24 MED ORDER — ACETAMINOPHEN 325 MG PO TABS: 650.0000 mg | ORAL_TABLET | ORAL | Status: DC | PRN

## 2023-12-24 MED ORDER — FENTANYL CITRATE (PF) 250 MCG/5ML IJ SOLN
INTRAMUSCULAR | Status: DC | PRN
  Administered 2023-12-24: 100 ug via INTRAVENOUS

## 2023-12-24 MED ORDER — FENTANYL CITRATE (PF) 100 MCG/2ML IJ SOLN
INTRAMUSCULAR | Status: AC
  Filled 2023-12-24: qty 2

## 2023-12-24 MED ORDER — HEPARIN (PORCINE) IN NACL 1000-0.9 UT/500ML-% IV SOLN
INTRAVENOUS | Status: DC | PRN
  Administered 2023-12-24 (×3): 500 mL

## 2023-12-24 MED ORDER — DROPERIDOL 2.5 MG/ML IJ SOLN: 0.6250 mg | Freq: Once | INTRAMUSCULAR | Status: DC | PRN

## 2023-12-24 MED ORDER — ONDANSETRON HCL 4 MG/2ML IJ SOLN: 4.0000 mg | Freq: Four times a day (QID) | INTRAMUSCULAR | Status: DC | PRN

## 2023-12-24 MED ORDER — ATROPINE SULFATE 1 MG/10ML IJ SOSY
PREFILLED_SYRINGE | INTRAMUSCULAR | Status: DC | PRN
  Administered 2023-12-24: 1 mg via INTRAVENOUS

## 2023-12-24 MED ORDER — HEPARIN SODIUM (PORCINE) 1000 UNIT/ML IJ SOLN
INTRAMUSCULAR | Status: DC | PRN
Start: 2023-12-24 — End: 2023-12-24
  Administered 2023-12-24: 10000 [IU] via INTRAVENOUS

## 2023-12-24 MED ORDER — PROPOFOL 10 MG/ML IV BOLUS
INTRAVENOUS | Status: DC | PRN
  Administered 2023-12-24: 100 mg via INTRAVENOUS

## 2023-12-24 MED ORDER — ONDANSETRON HCL 4 MG/2ML IJ SOLN
INTRAMUSCULAR | Status: DC | PRN
  Administered 2023-12-24: 4 mg via INTRAVENOUS

## 2023-12-24 MED ORDER — FENTANYL CITRATE (PF) 100 MCG/2ML IJ SOLN: 25.0000 ug | INTRAMUSCULAR | Status: DC | PRN

## 2023-12-24 MED ORDER — ROCURONIUM BROMIDE 10 MG/ML (PF) SYRINGE
PREFILLED_SYRINGE | INTRAVENOUS | Status: DC | PRN
  Administered 2023-12-24: 60 mg via INTRAVENOUS

## 2023-12-24 MED ORDER — LIDOCAINE 2% (20 MG/ML) 5 ML SYRINGE
INTRAMUSCULAR | Status: DC | PRN
  Administered 2023-12-24: 60 mg via INTRAVENOUS

## 2023-12-24 MED ORDER — DEXAMETHASONE SODIUM PHOSPHATE 10 MG/ML IJ SOLN
INTRAMUSCULAR | Status: DC | PRN
  Administered 2023-12-24: 10 mg via INTRAVENOUS

## 2023-12-24 MED ORDER — EPHEDRINE SULFATE-NACL 50-0.9 MG/10ML-% IV SOSY
PREFILLED_SYRINGE | INTRAVENOUS | Status: DC | PRN
  Administered 2023-12-24: 5 mg via INTRAVENOUS
  Administered 2023-12-24: 10 mg via INTRAVENOUS

## 2023-12-24 MED ORDER — SODIUM CHLORIDE 0.9 % IV SOLN: 250.0000 mL | INTRAVENOUS | Status: DC | PRN

## 2023-12-24 MED ORDER — PROTAMINE SULFATE 10 MG/ML IV SOLN
INTRAVENOUS | Status: DC | PRN
  Administered 2023-12-24: 10 mg via INTRAVENOUS
  Administered 2023-12-24: 40 mg via INTRAVENOUS

## 2023-12-24 MED ORDER — SUGAMMADEX SODIUM 200 MG/2ML IV SOLN
INTRAVENOUS | Status: DC | PRN
  Administered 2023-12-24: 200 mg via INTRAVENOUS

## 2023-12-24 MED ORDER — PHENYLEPHRINE HCL-NACL 20-0.9 MG/250ML-% IV SOLN
INTRAVENOUS | Status: DC | PRN
  Administered 2023-12-24: 30 ug/min via INTRAVENOUS

## 2023-12-24 NOTE — H&P (Signed)
 Electrophysiology Office Note:    Date:  12/24/2023   ID:  Billy Carpenter, DOB 07-22-1945, MRN [id number]  PCP:  Burney Darice CROME, MD    [facility] Providers Cardiologist:  None Electrophysiologist:  Eulas FORBES Furbish, MD     Referring MD: Furbish Eulas FORBES, MD   History of Present Illness:    Billy Carpenter is a 78 y.o. male with a medical history significant for atrial fibrillation, recurrent syncope, orthostatic hypotension, nonsustained VT, stroke, and carotid artery dissection. a former patient of Dr. Fernande, he presents today for follow-up.      Discussed the use of AI scribe software for clinical note transcription with the patient, who gave verbal consent to proceed.  History of Present Illness Billy Carpenter is a 78 year old male with recurrent syncope and atrial fibrillation who presents for follow-up and consideration of ablation. Referred by Dr. Fernande for management of atrial fibrillation and consideration of ablation.  He has a history of recurrent syncope and orthostatic hypotension, with two significant episodes of passing out. One episode occurred while driving, which he attributes to metoprolol -induced hypotension. Metoprolol  has been discontinued due to these side effects.  He has a history of atrial fibrillation, initially diagnosed in 2018 following an episode of vertigo. During episodes, he experiences palpitations and rapid heart rates, with average rates of 158-164 bpm and maximum rates reaching up to 188 bpm. He is currently on flecainide  for rhythm control.  A loop recorder was installed in 2021 to monitor his heart rhythm, revealing multiple episodes of narrow complex tachycardia. He finds the monthly reports from the device helpful in tracking his condition.  He has a history of stroke and carotid artery dissection, with a secondary hypercoagulable state. His CHADS2VASc score is 7, indicating a high risk of stroke.  He underwent back surgery in  February 2025 for L4 and L5 and is currently recovering from pinched nerve pain, particularly on one side. He reports no pain while lying flat but experiences discomfort when standing.         Today, He reports he is at baseline and has no new complaints.  I reviewed the patient's CT and labs. There was no LAA thrombus. he  has not missed any doses of anticoagulation, and he took his dose last night. There have been no changes in the patient's diagnoses, medications, or condition since our recent clinic visit.   EKGs/Labs/Other Studies Reviewed Today:     Echocardiogram:  TTE June 26, 2017 LVEF 50%.  Mild LVH.  Grade 1 diastolic dysfunction.  Severe left atrial dilation.    Cardiac catherization  January 2019 Results reviewed in epic  EKG:         Physical Exam:    VS:  BP (!) 121/95   Pulse 64   Temp 98 F (36.7 C)   Resp 16   Ht 5' 7.5 (1.715 m)   Wt 64.1 kg   SpO2 95%   BMI 21.82 kg/m     Wt Readings from Last 3 Encounters:  12/24/23 64.1 kg  09/26/23 66.9 kg  07/26/23 66.9 kg     GEN: Well nourished, well developed in no acute distress CARDIAC: RRR, no murmurs, rubs, gallops RESPIRATORY:  Normal work of breathing MUSCULOSKELETAL: no edema    ASSESSMENT & PLAN:     Paroxysmal atrial fibrillation Loop recorder interrogation today shows multiple brief episodes of narrow complex tachycardia --likely pulmonary vein tachycardia, brief atrial fibrillation He is symptomatic with palpitations  and fatigue Rates are not at all well-controlled, up to 160 bpm median rate We discussed management options.  Using a shared decision making approach, we opted to schedule A-fib ablation  We discussed the indication, rationale, logistics, anticipated benefits, and potential risks of the ablation procedure including but not limited to -- bleed at the groin access site, chest pain, damage to nearby organs such as the diaphragm, lungs, or esophagus, need for a  drainage tube, or prolonged hospitalization. I explained that the risk for stroke, heart attack, need for open chest surgery, or even death is very low but not zero. he  expressed understanding and wishes to proceed.   Recurrent syncope No definitive cause identified on loop recorder  Secondary hypercoagulable state CHA2DS2-VASc score 7      Signed, Eulas FORBES Furbish, MD  12/24/2023 7:11 AM    [facility]

## 2023-12-24 NOTE — Anesthesia Procedure Notes (Signed)
 Procedure Name: Intubation Date/Time: 12/24/2023 7:46 AM  Performed by: Marva Lonni PARAS, CRNAPre-anesthesia Checklist: Patient identified, Emergency Drugs available, Suction available and Patient being monitored Patient Re-evaluated:Patient Re-evaluated prior to induction Oxygen Delivery Method: Circle System Utilized Preoxygenation: Pre-oxygenation with 100% oxygen Induction Type: IV induction Ventilation: Mask ventilation without difficulty Laryngoscope Size: Mac and 4 Grade View: Grade I Tube type: Oral Tube size: 7.5 mm Number of attempts: 1 Airway Equipment and Method: Stylet and Oral airway Placement Confirmation: ETT inserted through vocal cords under direct vision, positive ETCO2 and breath sounds checked- equal and bilateral Secured at: 23 cm Tube secured with: Tape Dental Injury: Teeth and Oropharynx as per pre-operative assessment

## 2023-12-24 NOTE — Anesthesia Postprocedure Evaluation (Signed)
 Anesthesia Post Note  Patient: Billy Carpenter  Procedure(s) Performed: ATRIAL FIBRILLATION ABLATION     Patient location during evaluation: PACU Anesthesia Type: General Level of consciousness: awake and alert Pain management: pain level controlled Vital Signs Assessment: post-procedure vital signs reviewed and stable Respiratory status: spontaneous breathing, nonlabored ventilation, respiratory function stable and patient connected to nasal cannula oxygen Cardiovascular status: blood pressure returned to baseline and stable Postop Assessment: no apparent nausea or vomiting Anesthetic complications: no   There were no known notable events for this encounter.  Last Vitals:  Vitals:   12/24/23 1130 12/24/23 1200  BP: 130/88 137/85  Pulse:  88  Resp: 14 (!) 22  Temp:    SpO2:  93%    Last Pain:  Vitals:   12/24/23 0950  TempSrc:   PainSc: 0-No pain   Pain Goal:                   Thom JONELLE Peoples

## 2023-12-24 NOTE — Discharge Instructions (Signed)

## 2023-12-24 NOTE — Progress Notes (Signed)
 Patient was about to pee, discharging home

## 2023-12-24 NOTE — Transfer of Care (Signed)
 Immediate Anesthesia Transfer of Care Note  Patient: Billy Carpenter  Procedure(s) Performed: ATRIAL FIBRILLATION ABLATION  Patient Location: Cath Lab  Anesthesia Type:General  Level of Consciousness: drowsy and patient cooperative  Airway & Oxygen Therapy: Patient Spontanous Breathing  Post-op Assessment: Report given to RN and Post -op Vital signs reviewed and stable  Post vital signs: Reviewed and stable  Last Vitals:  Vitals Value Taken Time  BP 107/82 12/24/23 09:20  Temp 36.6 C 12/24/23 09:15  Pulse 86 12/24/23 09:20  Resp 19 12/24/23 09:20  SpO2 94 % 12/24/23 09:20    Last Pain:  Vitals:   12/24/23 0915  TempSrc: Oral         Complications: There were no known notable events for this encounter.

## 2023-12-24 NOTE — Progress Notes (Addendum)
 Patient walked to the bathroom without difficulties. Bilateral groins level 0, clean, dry, and intact. He was unable to pee, 360 ml in bladder with bladder scanner, waiting until he can to discharge.

## 2023-12-25 ENCOUNTER — Telehealth (HOSPITAL_COMMUNITY): Payer: Self-pay

## 2023-12-25 ENCOUNTER — Encounter: Payer: Self-pay | Admitting: Cardiovascular Disease

## 2023-12-25 MED FILL — Fentanyl Citrate Preservative Free (PF) Inj 100 MCG/2ML: INTRAMUSCULAR | Qty: 2 | Status: AC

## 2023-12-25 NOTE — Telephone Encounter (Signed)
 Spoke with patient to complete post procedure follow up call.  Patient reports no complications with groin sites.   Instructions reviewed with patient:  Remove large bandage at puncture site after 24 hours. It is normal to have bruising, tenderness, mild swelling, and a pea or marble sized lump/knot at the groin site which can take up to three months to resolve.  Get help right away if you notice sudden swelling at the puncture site.  Check your puncture site every day for signs of infection: fever, redness, swelling, pus drainage, warmth, foul odor or excessive pain. If this occurs, please call the office at [phone], to speak with the nurse. Get help right away if your puncture site is bleeding and the bleeding does not stop after applying firm pressure to the area.  You may continue to have skipped beats/ atrial fibrillation during the first several months after your procedure.  It is very important not to miss any doses of your blood thinner Xarelto .    You will follow up with the Afib clinic on 01/21/24 and follow up with the APP on 03/24/24.   Patient verbalized understanding to all instructions provided.

## 2023-12-26 ENCOUNTER — Other Ambulatory Visit

## 2023-12-26 ENCOUNTER — Ambulatory Visit
Admission: RE | Admit: 2023-12-26 | Discharge: 2023-12-26 | Disposition: A | Discharge status: Home / Self Care | Source: Ambulatory Visit | Attending: Neurosurgery | Admitting: Neurosurgery

## 2023-12-26 DIAGNOSIS — M4316 Spondylolisthesis, lumbar region: Secondary | ICD-10-CM

## 2023-12-28 NOTE — Progress Notes (Signed)
 [facility] Summary Report / Loop Recorder

## 2024-01-03 ENCOUNTER — Ambulatory Visit: Payer: Self-pay | Admitting: Surgery

## 2024-01-03 ENCOUNTER — Telehealth: Payer: Self-pay

## 2024-01-03 NOTE — Telephone Encounter (Signed)
   Pre-operative Risk Assessment    Patient Name: Billy Carpenter  DOB: [birth year] MRN: [id number]   Date of last office visit: 09/26/2023, Dr. Eulas Furbish, MD Date of next office visit: NONE   Request for Surgical Clearance    Procedure:  Hernia Surgery  Date of Surgery:  Clearance 01/10/24                                Surgeon: Dr. Mitzie Peers, MD Surgeon's Group or Practice Name: [facility]  Phone number: [phone] Fax number: [phone]   Type of Clearance Requested:   - Medical  - Pharmacy:  Hold Rivaroxaban  (Xarelto )     Type of Anesthesia:  General    Additional requests/questions:    Billy Carpenter   01/03/2024, 4:24 PM

## 2024-01-03 NOTE — H&P (Signed)
 Billy Carpenter [id number]   Referring Provider:  Self   Subjective   Chief Complaint: Follow-up (pt experiencing pain and larger)     History of Present Illness:  Returns for reevaluation.  Reports the hernia has increased in size and has become intermittently more painful since I last saw him.  He has also had spine surgery and has continued to have some neuropathic pain in the L4-S1 distribution from that and is due to meet with his spine surgeon to review a recent CT concerning for nonunion.  Also reports he had a cardiac ablation for A-fib.  Remains very active, including golf, hiking, etc. despite pain issues.  Feb 2024: Very pleasant and active 78 year old male referred for evaluation of a right inguinal hernia. He has a history of BPH and bladder stones (status post cystolitholopaxy and TURP), carotid artery disease with a history of spontaneous left internal carotid artery dissection, chronic low back pain, stroke, dyslipidemia, hypertension, hematuria, mitral regurgitation, atrial fibrillation on flecainide , metoprolol , diltiazem , and Xarelto , syncope.   He has noticed a protrusion in the right groin for about 6 months.  It is not painful, he does not think it has increased in size significantly, and he denies any GI or urinary symptoms related to this.  It is reducible.   He will be having a CT scan in the near future to evaluate possible etiologies of a bout of gross hematuria that he had recently, he sees Dr. Devere.  Of note, he is an avid golfer, he plays 3 times a week year-round.  He also does quite a lot of yard work which is fairly strenuous.  History of left indirect inguinal hernia repair by Dr. Merrilyn in 2009-op note reviewed (high ligation of hernia sac, Bard mesh secured with 2-0 Prolene's).  No other abdominal surgery.  Review of Systems: A complete review of systems was obtained from the patient.  I have reviewed this information and discussed as appropriate with the  patient.  See HPI as well for other ROS.   Medical History: Past Medical History:  Diagnosis Date   A-fib (CMS/HHS-HCC)    Anticoagulated 09/04/2017   Arrhythmia Estimate 1980   Family History   Bladder stone    Congenital cataract, hypertrophic cardiomyopathy, and mitochondrial myopathy syndrome (CMS/HHS-HCC)    Encounter for monitoring flecainide  therapy 09/04/2017   Hyperlipidemia Estimate 20 years   Taking Atorvastatin    Hypertension Diastolic High   Readings 85-95   PAF (paroxysmal atrial fibrillation) (CMS/HHS-HCC) 07/04/2017   Sleep apnea 05/04/17   No Evidence   Stroke (CMS/HHS-HCC) 2001   Torn Carotid Artery   Supraventricular tachycardia (CMS/HHS-HCC) 01/16/17   Ongoing   Syncope and collapse 06/25/17   Another on 08/15/17   Ventricular tachycardia (CMS/HHS-HCC) 8/21-9/20/18   Heart Monitor   Wrist fracture, left     Patient Active Problem List  Diagnosis   PAF (paroxysmal atrial fibrillation) (CMS/HHS-HCC)   Essential hypertension   Hyperlipidemia, mixed   History of CVA (cerebrovascular accident)   History of chest pain   Arrhythmia   Syncope   Spinal stenosis of lumbar region at multiple levels   Degenerative spondylolisthesis   SVT (supraventricular tachycardia) (CMS/HHS-HCC)   Encounter for monitoring flecainide  therapy   Anticoagulated    Past Surgical History:  Procedure Laterality Date   CARDIAC CATHETERIZATION  06/27/2017   [facility]   Cardiac Cath  12/24/2023   HERNIA REPAIR     LITHOTRIPSY       Allergies  Allergen Reactions  Alfuzosin Other (See Comments)    Unknown   Simvastatin Other (See Comments)    Unknown   Tamsulosin Other (See Comments)    Unknown    Current Outpatient Medications on File Prior to Visit  Medication Sig Dispense Refill   atorvastatin  (LIPITOR) 20 MG tablet TAKE 1 TABLET BY MOUTH EVERY DAY IN THE EVENING  0   cholecalciferol (VITAMIN D3) 2,000 unit capsule Take 4,000 Units by mouth once daily     dilTIAZem   (CARDIZEM  CD) 120 MG XR capsule TAKE 1 CAPSULE BY MOUTH ONCE DAILY . APPOINTMENT REQUIRED FOR FUTURE REFILLS     docusate sodium  (COLACE ORAL) Take by mouth     magnesium citrate oral solution Take 296 mLs by mouth once     polyethylene glycol (MIRALAX) packet Take 17 g by mouth once daily Mix in 4-8ounces of fluid prior to taking.     rivaroxaban  (XARELTO ) 20 mg tablet Take 20 mg by mouth daily with breakfast       Compound Medication 2 (two) times daily Intenzyme forte 2 tabs BID (Patient not taking: Reported on 06/13/2023)     flecainide  (TAMBOCOR ) 50 MG tablet Take 50 mg by mouth 2 (two) times daily     gabapentin  (NEURONTIN ) 100 MG capsule Take 1 capsule (100 mg total) by mouth 3 (three) times daily 90 capsule 11   glucosamine sulfate (GLUCOSAMINE) 500 mg Tab Take 1,000 mg by mouth once daily        metoprolol  succinate (TOPROL -XL) 25 MG XL tablet Take 1 tablet (25 mg total) by mouth once daily for 90 days 90 tablet 2   metoprolol  tartrate (LOPRESSOR ) 25 MG tablet Take 25 mg by mouth once daily     naproxen sodium (ALEVE) 220 MG tablet Take 220 mg by mouth 3 (three) times daily as needed     No current facility-administered medications on file prior to visit.    Family History  Problem Relation Age of Onset   Breast cancer Mother    Irregular Heart Beat (Arrhythmia) Mother        Deceased   No Known Problems Father      Social History   Tobacco Use  Smoking Status Never  Smokeless Tobacco Never  Tobacco Comments   No Interest in smoking     Social History   Socioeconomic History   Marital status: Married  Tobacco Use   Smoking status: Never   Smokeless tobacco: Never   Tobacco comments:    No Interest in smoking  Vaping Use   Vaping status: Never Used  Substance and Sexual Activity   Alcohol use: Not Currently    Comment: No alcohol since 06/25/17. Previous 2 drinks per day( 5 days)   Drug use: Never   Sexual activity: Yes    Partners: Female   Social Drivers of  Corporate investment banker Strain: Low Risk  (06/08/2023)   Overall Financial Resource Strain (CARDIA)    Difficulty of Paying Living Expenses: Not hard at all  Food Insecurity: No Food Insecurity (06/08/2023)   Hunger Vital Sign    Worried About Running Out of Food in the Last Year: Never true    Ran Out of Food in the Last Year: Never true  Transportation Needs: No Transportation Needs (06/08/2023)   PRAPARE - Administrator, Civil Service (Medical): No    Lack of Transportation (Non-Medical): No  Housing Stability: Low Risk  (06/12/2023)   Housing Stability Vital Sign  Unable to Pay for Housing in the Last Year: No    Number of Times Moved in the Last Year: 0    Homeless in the Last Year: No    Objective:    Vitals:   01/03/24 1016 01/03/24 1017  BP: 128/86   Pulse: 58   Temp: 36.7 C (98 F)   SpO2: 99%   Weight: 65.9 kg (145 lb 3.2 oz)   Height: 172.7 cm (5' 8)   PainSc:  0-No pain  PainLoc:  Groin     Body mass index is 22.08 kg/m.  Gen: A&Ox3, no distress  Unlabored respirations Abdomen is soft, nontender, nondistended.  There is laxity along the left groin and expected deep scar tissue but no overt protrusion to suggest a recurrent left inguinal hernia.  Difficult to appreciate any scar here.  There is a large reducible right inguinal hernia which is nontender and without any overlying skin changes.  Assessment and Plan:  Diagnoses and all orders for this visit:  Non-recurrent unilateral inguinal hernia without obstruction or gangrene   Previous left sided repair is intact by physical exam.  Right sided inguinal hernia is increased in size and is becoming more symptomatic. I recommend proceeding with an open repair of the right side. We reviewed the relevant anatomy and we discussed the technique of the procedure.  Discussed risks of bleeding, infection, pain, scarring, injury to structures in the area including nerves, blood vessels, bowel,  bladder, risk of chronic pain, hernia recurrence, risk of seroma or hematoma, urinary retention, and risks of general anesthesia including cardiovascular, pulmonary, and thromboembolic complications.  Discussed activity limitations postoperatively and expected timeline for recovery.  Questions welcomed and were answered to his satisfaction.  At this point, patient is ready to proceed with surgery and would like to do so in the next couple of weeks.  He will need cardiac clearance and will need to hold Xarelto  for 48 hours before surgery.   Marlow Hendrie ALAN FREUND, MD

## 2024-01-03 NOTE — Progress Notes (Signed)
 COVID Vaccine received:  []  No [x]  Yes Date of any COVID positive Test in last 90 days: no PCP - Darice Henle MD Cardiologist -  Electrophys- Eulas Furbish MD  Chest x-ray -  EKG -  12/24/23 Epic Stress Test -  ECHO -  Cardiac Cath - 06/27/17 Epic  Bowel Prep - [x]  No  []   Yes ______  Pacemaker / ICD device [x]  No []  Yes  Has a loop recorder Spinal Cord Stimulator:[x]  No []  Yes       History of Sleep Apnea? [x]  No []  Yes   CPAP used?- [x]  No []  Yes    Does the patient monitor blood sugar?          [x]  No []  Yes  []  N/A  Patient has: [x]  NO Hx DM   []  Pre-DM                 []  DM1  []   DM2 Does patient have a [employer] or Dexacom? []  No []  Yes   Fasting Blood Sugar Ranges-  Checks Blood Sugar _____ times a day  GLP1 agonist / usual dose - no GLP1 instructions:  SGLT-2 inhibitors / usual dose - no SGLT-2 instructions:   Blood Thinner / Instructions:Xarelto -  has no instructions to stop. He will call office for instructions. Instructed him that usually it is held for 3 days with last dose 01/07/24 if he does not hear from his MD.He verbalized understanding. Aspirin  Instructions:no  Comments:   Activity level: Patient is able to climb a flight of stairs without difficulty; [x]  No CP  [x]  No SOB,    Patient can perform ADLs without assistance.   Anesthesia review: A-fib, Ablation 12/24/23, CAD,HTN,CVA, Loop recorder, 1st degree block  Patient denies shortness of breath, fever, cough and chest pain at PAT appointment.  Patient verbalized understanding and agreement to the Pre-Surgical Instructions that were given to them at this PAT appointment. Patient was also educated of the need to review these PAT instructions again prior to his/her surgery.I reviewed the appropriate phone numbers to call if they have any and questions or concerns.

## 2024-01-03 NOTE — Patient Instructions (Signed)
 SURGICAL WAITING ROOM VISITATION  Patients having surgery or a procedure may have no more than 2 support people in the waiting area - these visitors may rotate.    Children under the age of 20 must have an adult with them who is not the patient.  Visitors with respiratory illnesses are discouraged from visiting and should remain at home.  If the patient needs to stay at the hospital during part of their recovery, the visitor guidelines for inpatient rooms apply. Pre-op nurse will coordinate an appropriate time for 1 support person to accompany patient in pre-op.  This support person may not rotate.    Please refer to the [facility] website for the visitor guidelines for Inpatients (after your surgery is over and you are in a regular room).       Your procedure is scheduled on: 01/10/24   Report to [facility] Main Entrance    Report to admitting at 10:45 AM   Call this number if you have problems the morning of surgery [phone]   Do not eat food :After Midnight.   After Midnight you may have the following liquids until  7 AM DAY OF SURGERY  Water Non-Citrus Juices (without pulp, NO RED-Apple, White grape, White cranberry) Black Coffee (NO MILK/CREAM OR CREAMERS, sugar ok)  Clear Tea (NO MILK/CREAM OR CREAMERS, sugar ok) regular and decaf                             Plain Jell-O (NO RED)                                           Fruit ices (not with fruit pulp, NO RED)                                     Popsicles (NO RED)                                                               Sports drinks like Gatorade (NO RED)                     Oral Hygiene is also important to reduce your risk of infection.                                    Remember - BRUSH YOUR TEETH THE MORNING OF SURGERY WITH YOUR REGULAR TOOTHPASTE  DENTURES WILL BE REMOVED PRIOR TO SURGERY PLEASE DO NOT APPLY Poly grip OR ADHESIVES!!!   Stop all vitamins and herbal supplements 7 days before  surgery.   Take these medicines the morning of surgery with A SIP OF WATER: Tylenol , Atorvastatin , Diltiazem (cardizem )             You may not have any metal on your body including hair pins, jewelry, and body piercing             Do not wear make-up, lotions, powders, perfumes/cologne, or deodorant  Men may shave face and neck.   Do not bring valuables to the hospital. [facility] IS NOT             RESPONSIBLE   FOR VALUABLES.   Contacts, glasses, dentures or bridgework may not be worn into surgery.  DO NOT BRING YOUR HOME MEDICATIONS TO THE HOSPITAL. PHARMACY WILL DISPENSE MEDICATIONS LISTED ON YOUR MEDICATION LIST TO YOU DURING YOUR ADMISSION IN THE HOSPITAL!    Patients discharged on the day of surgery will not be allowed to drive home.  Someone NEEDS to stay with you for the first 24 hours after anesthesia.   Special Instructions: Bring a copy of your healthcare power of attorney and living will documents the day of surgery if you haven't scanned them before.              Please read over the following fact sheets you were given: IF YOU HAVE QUESTIONS ABOUT YOUR PRE-OP INSTRUCTIONS PLEASE CALL [phone] [place]   If you received a COVID test during your pre-op visit  it is requested that you wear a mask when out in public, stay away from anyone that may not be feeling well and notify your surgeon if you develop symptoms. If you test positive for Covid or have been in contact with anyone that has tested positive in the last 10 days please notify you surgeon.    [facility] - Preparing for Surgery Before surgery, you can play an important role.  Because skin is not sterile, your skin needs to be as free of germs as possible.  You can reduce the number of germs on your skin by washing with CHG (chlorahexidine gluconate) soap before surgery.  CHG is an antiseptic cleaner which kills germs and bonds with the skin to continue killing germs even after washing. Please DO NOT use  if you have an allergy to CHG or antibacterial soaps.  If your skin becomes reddened/irritated stop using the CHG and inform your nurse when you arrive at Short Stay. Do not shave (including legs and underarms) for at least 48 hours prior to the first CHG shower.  You may shave your face/neck.  Please follow these instructions carefully:  1.  Shower with CHG Soap the night before surgery and the  morning of surgery.  2.  If you choose to wash your hair, wash your hair first as usual with your normal  shampoo.  3.  After you shampoo, rinse your hair and body thoroughly to remove the shampoo.                             4.  Use CHG as you would any other liquid soap.  You can apply chg directly to the skin and wash.  Gently with a scrungie or clean washcloth.  5.  Apply the CHG Soap to your body ONLY FROM THE NECK DOWN.   Do   not use on face/ open                           Wound or open sores. Avoid contact with eyes, ears mouth and   genitals (private parts).                       Wash face,  Genitals (private parts) with your normal soap.  6.  Wash thoroughly, paying special attention to the area where your    surgery  will be performed.  7.  Thoroughly rinse your body with warm water from the neck down.  8.  DO NOT shower/wash with your normal soap after using and rinsing off the CHG Soap.                9.  Pat yourself dry with a clean towel.            10.  Wear clean pajamas.            11.  Place clean sheets on your bed the night of your first shower and do not  sleep with pets. Day of Surgery : Do not apply any lotions/deodorants the morning of surgery.  Please wear clean clothes to the hospital/surgery center.  FAILURE TO FOLLOW THESE INSTRUCTIONS MAY RESULT IN THE CANCELLATION OF YOUR SURGERY  ________________________________________________________________________Incentive Spirometer  An incentive spirometer is a tool that can help keep your lungs clear and active. This  tool measures how well you are filling your lungs with each breath. Taking long deep breaths may help reverse or decrease the chance of developing breathing (pulmonary) problems (especially infection) following: A long period of time when you are unable to move or be active. BEFORE THE PROCEDURE  If the spirometer includes an indicator to show your best effort, your nurse or respiratory therapist will set it to a desired goal. If possible, sit up straight or lean slightly forward. Try not to slouch. Hold the incentive spirometer in an upright position. INSTRUCTIONS FOR USE  Sit on the edge of your bed if possible, or sit up as far as you can in bed or on a chair. Hold the incentive spirometer in an upright position. Breathe out normally. Place the mouthpiece in your mouth and seal your lips tightly around it. Breathe in slowly and as deeply as possible, raising the piston or the ball toward the top of the column. Hold your breath for 3-5 seconds or for as long as possible. Allow the piston or ball to fall to the bottom of the column. Remove the mouthpiece from your mouth and breathe out normally. Rest for a few seconds and repeat Steps 1 through 7 at least 10 times every 1-2 hours when you are awake. Take your time and take a few normal breaths between deep breaths. The spirometer may include an indicator to show your best effort. Use the indicator as a goal to work toward during each repetition. After each set of 10 deep breaths, practice coughing to be sure your lungs are clear. If you have an incision (the cut made at the time of surgery), support your incision when coughing by placing a pillow or rolled up towels firmly against it. Once you are able to get out of bed, walk around indoors and cough well. You may stop using the incentive spirometer when instructed by your caregiver.  RISKS AND COMPLICATIONS Take your time so you do not get dizzy or light-headed. If you are in pain, you may need  to take or ask for pain medication before doing incentive spirometry. It is harder to take a deep breath if you are having pain. AFTER USE Rest and breathe slowly and easily. It can be helpful to keep track of a log of your progress. Your caregiver can provide you with a simple table to help with this. If you are using the spirometer at home, follow  these instructions: SEEK MEDICAL CARE IF:  You are having difficultly using the spirometer. You have trouble using the spirometer as often as instructed. Your pain medication is not giving enough relief while using the spirometer. You develop fever of 100.5 F (38.1 C) or higher. SEEK IMMEDIATE MEDICAL CARE IF:  You cough up bloody sputum that had not been present before. You develop fever of 102 F (38.9 C) or greater. You develop worsening pain at or near the incision site. MAKE SURE YOU:  Understand these instructions. Will watch your condition. Will get help right away if you are not doing well or get worse. Document Released: 09/25/2006 Document Revised: 08/07/2011 Document Reviewed: 11/26/2006 [facility] Patient Information 2014 [place], [place].

## 2024-01-04 ENCOUNTER — Other Ambulatory Visit: Payer: Self-pay

## 2024-01-04 ENCOUNTER — Encounter (HOSPITAL_COMMUNITY)
Admission: RE | Admit: 2024-01-04 | Discharge: 2024-01-04 | Disposition: A | Discharge status: Home / Self Care | Source: Ambulatory Visit | Attending: Surgery | Admitting: Surgery

## 2024-01-04 ENCOUNTER — Encounter: Payer: Self-pay | Admitting: Cardiovascular Disease

## 2024-01-04 VITALS — BP 119/92 | HR 86 | Temp 97.5°F | Resp 18 | Ht 67.5 in | Wt 140.0 lb

## 2024-01-04 DIAGNOSIS — Z01818 Encounter for other preprocedural examination: Secondary | ICD-10-CM

## 2024-01-04 DIAGNOSIS — I10 Essential (primary) hypertension: Secondary | ICD-10-CM | POA: Diagnosis not present

## 2024-01-04 DIAGNOSIS — Z01812 Encounter for preprocedural laboratory examination: Secondary | ICD-10-CM | POA: Insufficient documentation

## 2024-01-04 LAB — BASIC METABOLIC PANEL WITH GFR
Anion gap: 9 (ref 5–15)
BUN: 16 mg/dL (ref 8–23)
CO2: 28 mmol/L (ref 22–32)
Calcium: 9.2 mg/dL (ref 8.9–10.3)
Chloride: 101 mmol/L (ref 98–111)
Creatinine, Ser: 0.99 mg/dL (ref 0.61–1.24)
GFR, Estimated: >60 mL/min (ref >60)
Glucose, Bld: 64 mg/dL — ABNORMAL LOW (ref 70–99)
Potassium: 4 mmol/L (ref 3.5–5.1)
Sodium: 138 mmol/L (ref 135–145)

## 2024-01-04 LAB — CBC
HCT: 45.2 % (ref 39.0–52.0)
Hemoglobin: 15.1 g/dL (ref 13.0–17.0)
MCH: 31.7 pg (ref 26.0–34.0)
MCHC: 33.4 g/dL (ref 30.0–36.0)
MCV: 94.8 fL (ref 80.0–100.0)
Platelets: 219 K/uL (ref 150–400)
RBC: 4.77 MIL/uL (ref 4.22–5.81)
RDW: 13.4 % (ref 11.5–15.5)
WBC: 7.2 K/uL (ref 4.0–10.5)
nRBC: 0 % (ref 0.0–0.2)

## 2024-01-07 ENCOUNTER — Encounter (HOSPITAL_COMMUNITY): Payer: Self-pay | Admitting: Medical

## 2024-01-07 ENCOUNTER — Encounter: Payer: Self-pay | Admitting: Emergency Medicine

## 2024-01-07 NOTE — Progress Notes (Addendum)
 Reviewed chart. Pt had an A.fib ablation on 7/28. Per Cardiology APP patient needs to remain on anticoagulation uninterrupted for 3 months after ablation. Discussed this with the patient. He states the hernia causes him discomfort when he hikes but he is not having any obstructive symptoms. Advised him to get in touch with [facility] and [facility] for further guidance on timing of surgery.  Nat, RN at [facility] updated.

## 2024-01-08 NOTE — Telephone Encounter (Signed)
   Patient Name: Billy Carpenter  DOB: 06-27-1945 MRN: [id number]  Primary Cardiologist: None  Chart reviewed as part of pre-operative protocol coverage.  Patient had an atrial fibrillation ablation on 12/24/2023, per protocol patient must remain on uninterrupted DOAC for 3 months postprocedure (03/25/24).   Please resend clearance request when procedure date has been rescheduled.  If there are any questions or concerns please notify the office.  Will remove from preoperative pool as there is no need for further preop APP input at this time.  Sapir Lavey D Rhone Ozaki, NP 01/08/2024, 7:55 AM

## 2024-01-10 ENCOUNTER — Ambulatory Visit (INDEPENDENT_AMBULATORY_CARE_PROVIDER_SITE_OTHER)

## 2024-01-10 ENCOUNTER — Telehealth: Payer: Self-pay | Admitting: Cardiovascular Disease

## 2024-01-10 ENCOUNTER — Encounter (HOSPITAL_COMMUNITY): Admission: RE | Payer: Self-pay | Source: Home / Self Care

## 2024-01-10 ENCOUNTER — Ambulatory Visit (HOSPITAL_COMMUNITY): Admission: RE | Admit: 2024-01-10 | Source: Home / Self Care | Admitting: Surgery

## 2024-01-10 DIAGNOSIS — R55 Syncope and collapse: Secondary | ICD-10-CM | POA: Diagnosis not present

## 2024-01-10 LAB — CUP PACEART REMOTE DEVICE CHECK
Date Time Interrogation Session: 20250813231115
Implantable Pulse Generator Implant Date: 20201211

## 2024-01-10 SURGERY — REPAIR, HERNIA, INGUINAL, ADULT
Anesthesia: General | Laterality: Right

## 2024-01-10 NOTE — Telephone Encounter (Signed)
 Device Alert notification received from [employer]:  ILR summary report received. Battery status OK. Normal device function. No new symptom, brady, or pause episodes. 219 AF episodes. AF burden x 33.1%.  On OAC per chart. 86 tachy events. All appear NCT. V-rates 150s-190s.  Monthly summary reports and ROV/PRN Trends show an increase in AF since end of July.  Forwarding to triage per protocol. _____________________________________________________________________________  Reviewed the patient's chart and he is s/p AF ablation on 12/24/23 with Dr. Nancey.  Contacted the patient to follow up on symptoms due to increase in AF since his ablation. Per Mr. Lamba, he has noticed more episodes of AF since his ablation and states that he feels whoozy during these episodes.   Confirmed that he is taking: - Eliquis 5 mg BID - Diltiazem  120 mg every day  I advised the patient that his report has been sent over to Dr. Nancey to review, but will update him on the patient's symptoms and await any further MD recommendations.  Also forward to AF Clinic as well.  He is aware we will call him back once these are received.   The patient voices understanding and is agreeable.  The patient is scheduled to follow up in the AF Clinic on 01/21/24 post procedure.

## 2024-01-11 ENCOUNTER — Encounter: Payer: Self-pay | Admitting: Cardiovascular Disease

## 2024-01-11 MED ORDER — FLECAINIDE ACETATE 50 MG PO TABS: 50.0000 mg | ORAL_TABLET | Freq: Two times a day (BID) | ORAL | 3 refills | Status: DC

## 2024-01-11 NOTE — Telephone Encounter (Signed)
 Spoke with pt regarding his symptoms. Pt stated that since his ablation he has been experiencing a fluttering in his chest that he believes is afib. The pt notices he feels lightheaded and woozy when he feels the fluttering. Pt denied any symptoms at the time of our phone call. The pt stated he notices the symptoms most with activity. Pt stated when he feels lightheaded he has trouble moving around. Pt was told to change positions carefully and to stay seated when he feels woozy. Pt was also that if he feels like he is going to pass out that he should go to the ED or call 911. Pt was told that Dr. Nancey would be notified of his symptoms. Pt verbalized understanding. All questions if any were answered.

## 2024-01-11 NOTE — Telephone Encounter (Signed)
 See MyChart message. Patient notified of recommendations.

## 2024-01-15 NOTE — Telephone Encounter (Signed)
 Per Dr Nancey, pt should follow up with PCP regarding pulmonary nodules on cardiac CT.  Note faxed to Dr Burney.

## 2024-01-17 ENCOUNTER — Ambulatory Visit: Payer: Self-pay | Admitting: Cardiovascular Disease

## 2024-01-21 ENCOUNTER — Ambulatory Visit (HOSPITAL_COMMUNITY)
Admission: RE | Admit: 2024-01-21 | Discharge: 2024-01-21 | Disposition: A | Discharge status: Home / Self Care | Source: Ambulatory Visit | Attending: Internal Medicine | Admitting: Internal Medicine

## 2024-01-21 VITALS — BP 148/100 | HR 77 | Ht 67.5 in | Wt 146.2 lb

## 2024-01-21 DIAGNOSIS — I4891 Unspecified atrial fibrillation: Secondary | ICD-10-CM | POA: Diagnosis not present

## 2024-01-21 DIAGNOSIS — I480 Paroxysmal atrial fibrillation: Secondary | ICD-10-CM | POA: Diagnosis not present

## 2024-01-21 DIAGNOSIS — D6869 Other thrombophilia: Secondary | ICD-10-CM | POA: Diagnosis not present

## 2024-01-21 DIAGNOSIS — Z5181 Encounter for therapeutic drug level monitoring: Secondary | ICD-10-CM

## 2024-01-21 DIAGNOSIS — Z79899 Other long term (current) drug therapy: Secondary | ICD-10-CM

## 2024-01-21 NOTE — Progress Notes (Signed)
 Primary Care Physician: Burney Darice CROME, MD Primary Cardiologist: None Electrophysiologist: Eulas FORBES Furbish, MD     Referring Physician: Dr. Furbish Marinda FORBES Billy Carpenter is a 78 y.o. male with a history of syncope, HTN, history of HFrEF, CAC score of 2206, HLD, orthostatic hypotension, NSVT, CVA, carotid artery dissection, and paroxysmal atrial fibrillation who presents for consultation in the [facility]. Patient is on Xarelto  20 mg daily for a CHADS2VASC score of 7.  On evaluation today, patient is currently in NSR. S/p Afib ablation on 12/24/23 by Dr. Furbish. Restarted flecainide  due to increased Afib burden noted on ILR by Dr. Furbish. No chest pain or SOB. Leg sites healed without issue. No missed doses of anticoagulant.  Today, he denies symptoms of orthopnea, PND, lower extremity edema, dizziness, presyncope, syncope, snoring, daytime somnolence, bleeding, or neurologic sequela. The patient is tolerating medications without difficulties and is otherwise without complaint today.    he has a BMI of Body mass index is 22.56 kg/m.SABRA Filed Weights   01/21/24 1048  Weight: 66.3 kg    Current Outpatient Medications  Medication Sig Dispense Refill   atorvastatin  (LIPITOR) 20 MG tablet Take 1 tablet (20 mg total) by mouth daily. 90 tablet 3   CALCIUM  PO Take 2 tablets by mouth every morning. Citracal- taking total of 400 mg daily     Cholecalciferol (VITAMIN D3) 50 MCG (2000 UT) CAPS Take 4,000 Units by mouth daily.     diltiazem  (CARDIZEM  CD) 120 MG 24 hr capsule Take 1 capsule (120 mg total) by mouth daily.     docusate sodium  (COLACE) 100 MG capsule Take 1 capsule (100 mg total) by mouth 2 (two) times daily. 30 capsule 0   flecainide  (TAMBOCOR ) 50 MG tablet Take 1 tablet (50 mg total) by mouth 2 (two) times daily. 180 tablet 3   MAGNESIUM CITRATE PO Take 1 tablet by mouth daily.     Multiple Vitamin (MULTIVITAMIN WITH MINERALS) TABS tablet Take 1 tablet  by mouth daily.     polyethylene glycol powder (GLYCOLAX/MIRALAX) 17 GM/SCOOP powder Take by mouth once.     rivaroxaban  (XARELTO ) 20 MG TABS tablet Take 1 tablet (20 mg total) by mouth daily with supper. 90 tablet 1   No current facility-administered medications for this encounter.    Atrial Fibrillation Management history:  Previous antiarrhythmic drugs: flecainide  Previous cardioversions: none Previous ablations: 12/24/23 Anticoagulation history: Xarelto    ROS- All systems are reviewed and negative except as per the HPI above.  Physical Exam: BP (!) 148/100   Pulse 77   Ht 5' 7.5 (1.715 m)   Wt 66.3 kg   BMI 22.56 kg/m   GEN: Well nourished, well developed in no acute distress NECK: No JVD; No carotid bruits CARDIAC: Regular rate and rhythm, no murmurs, rubs, gallops RESPIRATORY:  Clear to auscultation without rales, wheezing or rhonchi  ABDOMEN: Soft, non-tender, non-distended EXTREMITIES:  No edema; No deformity   EKG today demonstrates  Vent. rate 77 BPM PR interval 228 ms QRS duration 88 ms QT/QTcB 384/434 ms P-R-T axes 47 -20 14 Sinus rhythm with sinus arrhythmia with 1st degree A-V block with occasional Premature ventricular complexes Otherwise normal ECG When compared with ECG of 24-Dec-2023 09:28, Premature ventricular complexes are now Present  Echo 05/05/2019 demonstrated  1. Left ventricular ejection fraction, by visual estimation, is 55 to  60%. The left ventricle has normal function. There is no left ventricular  hypertrophy.  2. Left ventricular diastolic parameters are consistent with Grade I  diastolic dysfunction (impaired relaxation).   3. The left ventricle has no regional wall motion abnormalities.   4. Global right ventricle has normal systolic function.The right  ventricular size is mildly enlarged. No increase in right ventricular wall  thickness.   5. Left atrial size was mildly dilated.   6. Right atrial size was mildly dilated.   7.  Mild mitral annular calcification.   8. The mitral valve is normal in structure. Trace mitral valve  regurgitation. No evidence of mitral stenosis.   9. The tricuspid valve is normal in structure. Tricuspid valve  regurgitation is trivial.  10. The aortic valve is tricuspid. Aortic valve regurgitation is trivial.  No evidence of aortic valve sclerosis or stenosis.  11. There is mild to moderate dilatation of the aortic root measuring 44  mm.  12. The tricuspid regurgitant velocity is 2.33 m/s, and with an assumed  right atrial pressure of 3 mmHg, the estimated right ventricular systolic  pressure is normal at 24.7 mmHg.  13. The inferior vena cava is normal in size with greater than 50%  respiratory variability, suggesting right atrial pressure of 3 mmHg.    ASSESSMENT & PLAN CHA2DS2-VASc Score = 7  The patient's score is based upon: CHF History: 1 HTN History: 1 Diabetes History: 0 Stroke History: 2 Vascular Disease History: 1 Age Score: 2 Gender Score: 0       ASSESSMENT AND PLAN: Paroxysmal Atrial Fibrillation (ICD10:  I48.0) The patient's CHA2DS2-VASc score is 7, indicating a 11.2% annual risk of stroke.   S/p Afib ablation on 12/24/23 by Dr. Nancey.  Patient is currently in NSR. BP recheck within normal limits ~130/86. Discussed with patient if AAD therapy is indicated long term then would have to transition away from flecainide  due to elevated coronary calcium  score. Recommended to establish care with general cardiologist - they will speak with PCP for this.    Secondary Hypercoagulable State (ICD10:  D68.69) The patient is at significant risk for stroke/thromboembolism based upon his CHA2DS2-VASc Score of 7.  Continue Rivaroxaban  (Xarelto ).  Continue Xarelto .    High risk medication monitoring (ICD10: U5195107) Patient requires ongoing monitoring for anti-arrhythmic medication which has the potential to cause life threatening arrhythmias or AV block. ECG intervals are  stable. Continue flecainide  50 mg BID.   Follow up with EP as scheduled.    Terra Pac, PA-C  [facility] [address] [place], [place] [address] [phone]

## 2024-01-25 ENCOUNTER — Telehealth: Payer: Self-pay | Admitting: Cardiovascular Disease

## 2024-01-25 ENCOUNTER — Other Ambulatory Visit (HOSPITAL_COMMUNITY): Payer: Self-pay | Admitting: *Deleted

## 2024-01-25 DIAGNOSIS — I4891 Unspecified atrial fibrillation: Secondary | ICD-10-CM

## 2024-01-25 DIAGNOSIS — R931 Abnormal findings on diagnostic imaging of heart and coronary circulation: Secondary | ICD-10-CM

## 2024-01-25 NOTE — Telephone Encounter (Signed)
 Patient calling in about his ablation. Please advise

## 2024-01-25 NOTE — Telephone Encounter (Signed)
 Spoke with Pt. Pt was not calling about his ablation. Pt states he was calling to schedule an appt with General Cardiology as recommended by Fairy Heinrich and his PCP. PCP suggested Dr Verlin. Will forward to Fairy Heinrich for request of General Cardiology referral as I do not see one placed by PCP.

## 2024-02-11 ENCOUNTER — Ambulatory Visit (INDEPENDENT_AMBULATORY_CARE_PROVIDER_SITE_OTHER)

## 2024-02-11 DIAGNOSIS — R55 Syncope and collapse: Secondary | ICD-10-CM | POA: Diagnosis not present

## 2024-02-11 LAB — CUP PACEART REMOTE DEVICE CHECK
Date Time Interrogation Session: 20250913230905
Implantable Pulse Generator Implant Date: 20201211

## 2024-02-12 ENCOUNTER — Encounter: Payer: Self-pay | Admitting: Cardiovascular Disease

## 2024-02-13 ENCOUNTER — Telehealth: Payer: Self-pay | Admitting: Pharmacy Technician

## 2024-02-13 ENCOUNTER — Other Ambulatory Visit: Payer: Self-pay

## 2024-02-13 ENCOUNTER — Encounter: Payer: Self-pay | Admitting: Cardiovascular Disease

## 2024-02-13 DIAGNOSIS — I480 Paroxysmal atrial fibrillation: Secondary | ICD-10-CM

## 2024-02-13 MED ORDER — RIVAROXABAN 20 MG PO TABS: 20.0000 mg | ORAL_TABLET | Freq: Every day | ORAL | 1 refills | Status: AC

## 2024-02-13 NOTE — Telephone Encounter (Signed)
 Hi, we received a refill request from [facility] mail service for xarelto . Thank you

## 2024-02-13 NOTE — Telephone Encounter (Signed)
 Prescription refill request for Xarelto  received.  Indication:afib Last office visit:8/25 Weight:66.3  kg Age:78 Scr:0.99  8/25 CrCl:56.74  ml/min  Prescription refilled

## 2024-02-16 ENCOUNTER — Ambulatory Visit: Payer: Self-pay | Admitting: Cardiovascular Disease

## 2024-02-16 NOTE — Progress Notes (Signed)
 Remote Loop Recorder Transmission

## 2024-02-20 NOTE — Progress Notes (Signed)
 Remote Loop Recorder Transmission

## 2024-03-04 ENCOUNTER — Telehealth: Payer: Self-pay

## 2024-03-04 NOTE — Telephone Encounter (Signed)
   Pre-operative Risk Assessment    Patient Name: Billy Carpenter  DOB: 21-May-1946 MRN: [id number]   Date of last office visit: 01/21/24 FAIRY TERRA RIGGERS (AFIB CLINIC), 09/26/23 EULAS FURBISH, MD Date of next office visit: 03/18/24 DAPHNE BARRACK, NP   Request for Surgical Clearance    Procedure:  HERNIA SURGERY  Date of Surgery:  Clearance TBD         PER REQUEST LATE NOVEMBER                       Surgeon:  LYNDA LEOS, MD Surgeon's Group or Practice Name:  [facility] Phone number:  [phone] Fax number:  [phone]  ATTN: ROSALINE SPRANG, CMA   Type of Clearance Requested:   - Medical  - Pharmacy:  Hold Rivaroxaban  (Xarelto )     Type of Anesthesia:  General    Additional requests/questions:    Signed, Lucie DELENA Ku   03/04/2024, 5:36 PM

## 2024-03-05 NOTE — Telephone Encounter (Signed)
 Pharmacy please advise on holding Xarelto  prior to HERNIA SURGERY  scheduled for TBD. However, patient is being seen in the office by Daphne Barrack on 03/18/2024 for pre-op clearance. Last labs were on 01/04/2024. Thank you.

## 2024-03-06 NOTE — Progress Notes (Signed)
 Remote Loop Recorder Transmission

## 2024-03-10 DIAGNOSIS — L820 Inflamed seborrheic keratosis: Secondary | ICD-10-CM | POA: Insufficient documentation

## 2024-03-10 DIAGNOSIS — C44621 Squamous cell carcinoma of skin of unspecified upper limb, including shoulder: Secondary | ICD-10-CM | POA: Insufficient documentation

## 2024-03-10 DIAGNOSIS — C44212 Basal cell carcinoma of skin of right ear and external auricular canal: Secondary | ICD-10-CM | POA: Insufficient documentation

## 2024-03-10 DIAGNOSIS — Z8051 Family history of malignant neoplasm of kidney: Secondary | ICD-10-CM | POA: Insufficient documentation

## 2024-03-10 NOTE — Telephone Encounter (Signed)
 Patient with diagnosis of A Fib on Xarelto  for anticoagulation.  Ablation on 12/24/23  Procedure: hernia repair Date of procedure: TBD   CHA2DS2-VASc Score = 7  This indicates a 11.2% annual risk of stroke. The patient's score is based upon: CHF History: 1 HTN History: 1 Diabetes History: 0 Stroke History: 2 Vascular Disease History: 1 Age Score: 2 Gender Score: 0    CrCl 57 ml/min Platelet count 219K  Patient HAS had an Afib/aflutter ablation in the last 3 months, DCCV within the last 4 weeks or a watchman implanted in the last 45 days. Patient can not hold anticoagulant until after 03/25/24.  Due to elevated risk score, recommend holding Xarelto  for one day before procedure.   Patient will not need bridging with Lovenox (enoxaparin) around procedure.  **This guidance is not considered finalized until pre-operative APP has relayed final recommendations.**

## 2024-03-11 ENCOUNTER — Ambulatory Visit

## 2024-03-11 ENCOUNTER — Telehealth: Payer: Self-pay

## 2024-03-11 DIAGNOSIS — R55 Syncope and collapse: Secondary | ICD-10-CM

## 2024-03-11 NOTE — Telephone Encounter (Signed)
   Pre-operative Risk Assessment    Patient Name: Billy Carpenter  DOB: 1945-07-02 MRN: [id number]   Date of last office visit: 01/21/24 Date of next office visit: 03/18/24   Request for Surgical Clearance    Procedure:  Left L5 TFESI   Date of Surgery:  Clearance TBD but clearance stated STAT                                  Surgeon:   Surgeon's Group or Practice Name:  [facility] Phone number:  [phone] Fax number:  [phone]   Type of Clearance Requested:   - Medical  - Pharmacy:  Hold Rivaroxaban  (Xarelto ) 3 days prior and can resume day after    Type of Anesthesia:  Not Indicated   Additional requests/questions:    Bonney Rebeca Blight   03/11/2024, 3:23 PM

## 2024-03-12 LAB — CUP PACEART REMOTE DEVICE CHECK
Date Time Interrogation Session: 20251013230414
Implantable Pulse Generator Implant Date: 20201211

## 2024-03-13 ENCOUNTER — Encounter

## 2024-03-13 NOTE — Progress Notes (Addendum)
 Electrophysiology Office Note:   Date:  03/18/2024  ID:  Billy Carpenter, DOB Apr 27, 1946, MRN [id number]  Primary Cardiologist: None Primary Heart Failure: None Electrophysiologist: Eulas FORBES Furbish, MD      History of Present Illness:   Billy Carpenter is a 78 y.o. male with h/o AF s/p ILR, HFrEF, elevated CAC score (2206), HTN, NSVT, orthostatic hypotension, CVA, carotid artery dissection seen today for routine electrophysiology follow-up s/p Ablation.  Since last being seen in our clinic the patient reports doing well overall.  He reports he is pending clearance for 2 procedures thoracic back injection and hernia repair.  He indicates that he continues to play golf without difficulty.  He is unaware of any AF symptoms.  He denies chest pain and shortness of breath with exertion.  No significant lower extremity swelling.  He denies chest pain, palpitations, dyspnea, PND, orthopnea, nausea, vomiting, dizziness, syncope, edema, weight gain, or early satiety.    Review of systems complete and found to be negative unless listed in HPI.   EP Information / Studies Reviewed:    EKG is ordered today. Personal review as below.  EKG Interpretation Date/Time:  Tuesday March 18 2024 09:15:46 EDT Ventricular Rate:  74 PR Interval:  256 QRS Duration:  86 QT Interval:  392 QTC Calculation: 435 R Axis:   -20  Text Interpretation: Sinus rhythm with 1st degree A-V block Confirmed by Aniceto Jarvis ([id number]) on 03/18/2024 9:58:01 AM   Arrhythmia / AAD / Pertinent EP Studies AF EPS 12/24/23 > SR on exam, PVI ablation of PF energy, PW ablation  Device  MDT ILR implanted 05/09/2019 for AF   Risk Assessment/Calculations:    CHA2DS2-VASc Score = 7   This indicates a 11.2% annual risk of stroke. The patient's score is based upon: CHF History: 1 HTN History: 1 Diabetes History: 0 Stroke History: 2 Vascular Disease History: 1 Age Score: 2 Gender Score: 0    HYPERTENSION CONTROL Vitals:    03/18/24 0908 03/18/24 0915  BP: (!) 155/87 (!) 144/70    The patient's blood pressure is elevated above target today.  In order to address the patient's elevated BP: A current anti-hypertensive medication was adjusted today.; Blood pressure will be monitored at home to determine if medication changes need to be made.           Physical Exam:   VS:  BP (!) 144/70   Pulse 88   Ht 5' 7.5 (1.715 m)   Wt 150 lb 3.2 oz (68.1 kg)   SpO2 94%   BMI 23.18 kg/m    Wt Readings from Last 3 Encounters:  03/18/24 150 lb 3.2 oz (68.1 kg)  01/21/24 146 lb 3.2 oz (66.3 kg)  01/04/24 140 lb (63.5 kg)     GEN: Well nourished, well developed in no acute distress NECK: No JVD; No carotid bruits CARDIAC: Regular rate and rhythm, no murmurs, rubs, gallops RESPIRATORY:  Clear to auscultation without rales, wheezing or rhonchi  ABDOMEN: Soft, non-tender, non-distended EXTREMITIES:  No edema; No deformity   ASSESSMENT AND PLAN:    Paroxysmal Atrial Fibrillation  High Risk Medication Monitoring: Flecainide   CHA2DS2-VASc 7 -OAC for stroke prophylaxis  -Increase diltiazem  CD to 180 mg daily for improved ventricular rate control -continue flecainide  50 mg BID for now > may need to stop pending evaluation with Cardiology for elevated CAC score  -ILR review 03/18/24 (9/15-10/21/25) > 0.9% AF, 1.2% PVC's, 12 tachy episodes -ECHO as below   Secondary Hypercoagulable State  -  continue Xarelto , dose reviewed and appropriate  HFrEF  -per Cardiology  -euvolemic on exam    Elevated CAC Score  CAC of 2206 in 7/25 -Pending evaluation by Dr. Verlin -assess ECHO > pending results of ECHO / cardiac work up, he may need to come off Flecainide   -no anginal symptoms    **NOTE TWO PROCEDURE's FOR REVIEW BELOW** Pre-Procedure Cardiac Evaluation  Click Here to Calculate RCRI      :[id number]}   Billy Carpenter perioperative risk of a major cardiac event is 11% (for this procedure) according to the Revised  Cardiac Risk Index (RCRI).  Therefore, he is at moderate to high risk for perioperative complications.   His functional capacity is good at 7.65 METs according to the Duke Activity Status Index (DASI).  Recommendations: The patient requires an echocardiogram before a disposition can be made regarding surgical risk.                For inguinal hernia repair.   Antiplatelet and/or Anticoagulation Recommendations:  Xarelto  can not be interrupted until after 03/25/24. Xarelto  (Rivaroxaban ) can be held for 1 days prior to surgery.  Please resume post op when felt to be safe.     Request for Surgical Clearance    Procedure:  HERNIA SURGERY Date of Surgery:  Clearance TBD PER REQUEST LATE NOVEMBER                     Surgeon:  LYNDA LEOS, MD Surgeon's Group or Practice Name:  [facility] Phone number:  [phone] Fax number:  [phone]  ATTN: ROSALINE SPRANG, CMA Type of Clearance Requested:   - Medical  - Pharmacy:  Hold Rivaroxaban  (Xarelto )   Type of Anesthesia:  General         Billy Carpenter perioperative risk of a major cardiac event is 6.6% (for his back injection) according to the Revised Cardiac Risk Index (RCRI).  Therefore, he is at low - moderate risk for perioperative complications.  His functional capacity is good at 7.65 METs according to the Duke Activity Status Index (DASI). Recommendations: According to ACC/AHA guidelines, no further cardiovascular testing needed.  The patient may proceed to surgery at acceptable risk.  He does not need to have further testing for back injection.   Antiplatelet and/or Anticoagulation Recommendations:  Xarelto  (Rivaroxaban ) can be held for 3 days prior to surgery.  Please resume post op when felt to be safe.   It can not be interrupted until after 03/25/24   Request for Surgical Clearance Procedure:  Left L5 TFESI  Date of Surgery:  Clearance TBD but clearance stated STAT                               Surgeon:    Surgeon's Group or Practice Name:  [facility] Phone number:  [phone] Fax number:  [phone] Type of Clearance Requested:   - Medical  - Pharmacy:  Hold Rivaroxaban  (Xarelto ) 3 days prior and can resume day after  Type of Anesthesia:  Not Indicated    Follow up with Dr. Nancey / EP APP in 3 months  Signed, Daphne Barrack, NP-C, AGACNP-BC [facility] - Electrophysiology  03/18/2024, 1:24 PM   Addendum 04/23/24  ECHO reviewed, normal EF, no wall motion abnormalities. Patient ok to proceed with procedures as above at moderate surgical risk.  Will route note to Dr. Leos.     Daphne Barrack, NP-C,  AGACNP-BC [facility] - Electrophysiology  04/23/2024, 3:16 PM

## 2024-03-14 NOTE — Progress Notes (Signed)
 Remote Loop Recorder Transmission

## 2024-03-16 NOTE — Telephone Encounter (Signed)
 Patient with diagnosis of atrial fibrillation on Xarelto  for anticoagulation.    Procedure:  Left L5 TFESI    Date of Surgery:  Clearance TBD but clearance stated STAT   CHA2DS2-VASc Score = 7   This indicates a 11.2% annual risk of stroke. The patient's score is based upon: CHF History: 1 HTN History: 1 Diabetes History: 0 Stroke History: 2 Vascular Disease History: 1 Age Score: 2 Gender Score: 0   Chart indicates stroke in notes prior to 2010.    CrCl 57 Platelet count 219  Patient HAS had an Afib/aflutter ablation in the last 3 months, 12/24/2023.  Per office protocol, patient can hold Xarelto  for 3 days prior to procedure.  Cannot hold Xarelto  until after 03/25/24.     **This guidance is not considered finalized until pre-operative APP has relayed final recommendations.**

## 2024-03-17 NOTE — Telephone Encounter (Signed)
   Name: Billy Carpenter  DOB: 08-13-1945  MRN: [id number]  Primary Cardiologist: None  Chart reviewed as part of pre-operative protocol coverage. The patient has an upcoming visit scheduled with Daphne Barrack, NP on 03/18/24 at which time clearance can be addressed in case there are any issues that would impact surgical recommendations.  Left L5 TFESI Is not scheduled as below. I added preop FYI to appointment note so that provider is aware to address at time of outpatient visit.  Per office protocol the cardiology provider should forward their finalized clearance decision and recommendations regarding antiplatelet therapy to the requesting party below.    This message will also be routed to pharmacy pool for input on holding Xarelto  as requested below so that this information is available to the clearing provider at time of patient's appointment.   I will route this message as FYI to requesting party and remove this message from the preop box as separate preop APP input not needed at this time.   Please call with any questions.  Lenix Kidd D Rance Smithson, NP  03/17/2024, 9:57 AM

## 2024-03-18 ENCOUNTER — Encounter: Payer: Self-pay | Admitting: Pulmonary Disease

## 2024-03-18 ENCOUNTER — Ambulatory Visit: Attending: Pulmonary Disease | Admitting: Pulmonary Disease

## 2024-03-18 VITALS — BP 144/70 | HR 88 | Ht 67.5 in | Wt 150.2 lb

## 2024-03-18 DIAGNOSIS — Z79899 Other long term (current) drug therapy: Secondary | ICD-10-CM

## 2024-03-18 DIAGNOSIS — I5020 Unspecified systolic (congestive) heart failure: Secondary | ICD-10-CM

## 2024-03-18 DIAGNOSIS — I480 Paroxysmal atrial fibrillation: Secondary | ICD-10-CM

## 2024-03-18 DIAGNOSIS — D6869 Other thrombophilia: Secondary | ICD-10-CM | POA: Diagnosis not present

## 2024-03-18 DIAGNOSIS — R931 Abnormal findings on diagnostic imaging of heart and coronary circulation: Secondary | ICD-10-CM

## 2024-03-18 DIAGNOSIS — Z5181 Encounter for therapeutic drug level monitoring: Secondary | ICD-10-CM

## 2024-03-18 MED ORDER — DILTIAZEM HCL ER COATED BEADS 180 MG PO CP24: 180.0000 mg | ORAL_CAPSULE | Freq: Every day | ORAL | 3 refills | Status: DC

## 2024-03-18 NOTE — Progress Notes (Signed)
 Note faxed as requested

## 2024-03-18 NOTE — Patient Instructions (Signed)
 Medication Instructions:  Increase cardizem  to 180 mg daily *If you need a refill on your cardiac medications before your next appointment, please call your pharmacy*  Lab Work: None ordered If you have labs (blood work) drawn today and your tests are completely normal, you will receive your results only by: MyChart Message (if you have MyChart) OR A paper copy in the mail If you have any lab test that is abnormal or we need to change your treatment, we will call you to review the results.  Testing/Procedures: Your physician has requested that you have an echocardiogram. Echocardiography is a painless test that uses sound waves to create images of your heart. It provides your doctor with information about the size and shape of your heart and how well your heart's chambers and valves are working. This procedure takes approximately one hour. There are no restrictions for this procedure. Please do NOT wear cologne, perfume, aftershave, or lotions (deodorant is allowed). Please arrive 15 minutes prior to your appointment time.  Please note: We ask at that you not bring children with you during ultrasound (echo/ vascular) testing. Due to room size and safety concerns, children are not allowed in the ultrasound rooms during exams. Our front office staff cannot provide observation of children in our lobby area while testing is being conducted. An adult accompanying a patient to their appointment will only be allowed in the ultrasound room at the discretion of the ultrasound technician under special circumstances. We apologize for any inconvenience.   Follow-Up: At [facility], you and your health needs are our priority.  As part of our continuing mission to provide you with exceptional heart care, our providers are all part of one team.  This team includes your primary Cardiologist (physician) and Advanced Practice Providers or APPs (Physician Assistants and Nurse Practitioners) who all work  together to provide you with the care you need, when you need it.  Your next appointment:   3 month(s)  Provider:   Eulas Furbish, MD

## 2024-03-19 ENCOUNTER — Other Ambulatory Visit: Payer: Self-pay

## 2024-03-24 ENCOUNTER — Encounter: Admitting: Pulmonary Disease

## 2024-03-24 ENCOUNTER — Ambulatory Visit: Payer: Self-pay | Admitting: Cardiovascular Disease

## 2024-04-04 ENCOUNTER — Telehealth: Payer: Self-pay | Admitting: Neurology

## 2024-04-04 NOTE — Telephone Encounter (Signed)
 Pt called stating that  he need to get clearance from Neurologist for driving . DMV is recommended a not from Md about PT . Pt last time seen here was 01/2022 , Pt states he would need appt for this year

## 2024-04-08 ENCOUNTER — Other Ambulatory Visit: Payer: Self-pay | Admitting: Family Medicine

## 2024-04-08 ENCOUNTER — Encounter: Payer: Self-pay | Admitting: Family Medicine

## 2024-04-08 DIAGNOSIS — R918 Other nonspecific abnormal finding of lung field: Secondary | ICD-10-CM

## 2024-04-10 ENCOUNTER — Ambulatory Visit (HOSPITAL_COMMUNITY): Admit: 2024-04-10 | Admitting: General Surgery

## 2024-04-10 ENCOUNTER — Inpatient Hospital Stay: Admission: RE | Admit: 2024-04-10 | Discharge: 2024-04-10 | Attending: Family Medicine | Admitting: Family Medicine

## 2024-04-10 DIAGNOSIS — R918 Other nonspecific abnormal finding of lung field: Secondary | ICD-10-CM

## 2024-04-10 SURGERY — REPAIR, HERNIA, INGUINAL, LAPAROSCOPIC
Anesthesia: General | Laterality: Right

## 2024-04-11 ENCOUNTER — Ambulatory Visit (INDEPENDENT_AMBULATORY_CARE_PROVIDER_SITE_OTHER)

## 2024-04-11 DIAGNOSIS — I480 Paroxysmal atrial fibrillation: Secondary | ICD-10-CM

## 2024-04-13 LAB — CUP PACEART REMOTE DEVICE CHECK
Date Time Interrogation Session: 20251113230422
Implantable Pulse Generator Implant Date: 20201211

## 2024-04-15 NOTE — Progress Notes (Signed)
 Remote Loop Recorder Transmission

## 2024-04-17 ENCOUNTER — Other Ambulatory Visit: Payer: Self-pay | Admitting: Neurosurgery

## 2024-04-17 DIAGNOSIS — M4316 Spondylolisthesis, lumbar region: Secondary | ICD-10-CM

## 2024-04-21 ENCOUNTER — Ambulatory Visit (HOSPITAL_COMMUNITY)
Admission: RE | Admit: 2024-04-21 | Discharge: 2024-04-21 | Disposition: A | Discharge status: Home / Self Care | Source: Ambulatory Visit | Attending: Pulmonary Disease | Admitting: Pulmonary Disease

## 2024-04-21 DIAGNOSIS — I480 Paroxysmal atrial fibrillation: Secondary | ICD-10-CM | POA: Insufficient documentation

## 2024-04-23 ENCOUNTER — Ambulatory Visit: Payer: Self-pay | Admitting: Pulmonary Disease

## 2024-04-28 ENCOUNTER — Ambulatory Visit: Payer: Self-pay | Admitting: Cardiovascular Disease

## 2024-04-29 ENCOUNTER — Telehealth: Payer: Self-pay | Admitting: Pulmonary Disease

## 2024-04-29 NOTE — Telephone Encounter (Signed)
 Clarified hold timing with pharmacy for hernia surgery. Patient can hold for 2 days before hernia surgery (initial clearance was for 1 day).  Information relayed to Dr. Rubin as well.    Patient called and instructed on the same.  He reports he is scheduled for Monday 12/8 with Dr. Rubin.     Billy Barrack, NP-C, AGACNP-BC [facility] - Electrophysiology  04/29/2024, 5:08 PM

## 2024-04-30 NOTE — Progress Notes (Unsigned)
 No chief complaint on file.  History of Present Illness: 78 yo male with history of carotid artery disease, CVA in setting of carotid artery dissection, thoracic aortic aneurysm, HLD, persistent atrial fibrillation s/p ablation, HTN, CAD (abnormal coronary calcium  score) and mitral regurgitation who is here today as a new patient to establish cardiology care. He has been followed in our office in the EP clinic by Dr. Nancey. He has persistent atrial fibrillation and underwent atrial fibrillation ablation in July 2025. Loop recorder is in place. Cardiac CT performed prior to his ablation in July 2025 with coronary calcium  score of 2206. Ascending aorta noted to be normal in size. Echo November 2025 with LVEF=55-60%. Mild MR. Mild AI. Dilated ascending aorta.   He tells me today that he ***  Primary Care Physician: Burney Darice CROME, MD   Past Medical History:  Diagnosis Date   Arthritis    BPH (benign prostatic hyperplasia)    Carotid artery disease    Left internal carotid artery spontaneous dissection in the past  //   Doppler, October, 2013, minimal plaque, 0-39% bilateral   Chronic low back pain 05/03/2017   CVA (cerebral infarction)    left insular cortex stroke with left internal carotid artery dissection and near occlusion   Dyslipidemia    Dysrhythmia    A. Fib   Hematuria    History of kidney stones    Bladder Stone   Hypertension    Mitral regurgitation    mild, echo, March, 2010   Peripheral vascular disease    Carotid Artery Disease.   Persistent atrial fibrillation (HCC)    Stroke (HCC) 04/2019   No deficits   Syncope    Syncope and collapse 12/21/2017    Past Surgical History:  Procedure Laterality Date   ATRIAL FIBRILLATION ABLATION N/A 12/24/2023   Procedure: ATRIAL FIBRILLATION ABLATION;  Surgeon: Nancey Eulas BRAVO, MD;  Location: [facility];  Service: Cardiovascular;  Laterality: N/A;   BLADDER STONE REMOVAL  2010   Along with prostate surgery    COLONOSCOPY WITH PROPOFOL  Bilateral 09/27/2022   Procedure: COLONOSCOPY WITH PROPOFOL ;  Surgeon: Burnette Fallow, MD;  Location: [facility];  Service: Gastroenterology;  Laterality: Bilateral;   INGUINAL HERNIA REPAIR Left    Sometime after prostate surgery   IR ANGIO INTRA EXTRACRAN SEL COM CAROTID INNOMINATE BILAT MOD SED  05/16/2019   IR ANGIO VERTEBRAL SEL VERTEBRAL BILAT MOD SED  05/16/2019   IR US  GUIDE VASC ACCESS RIGHT  05/16/2019   LEFT HEART CATH AND CORONARY ANGIOGRAPHY N/A 06/27/2017   Procedure: LEFT HEART CATH AND CORONARY ANGIOGRAPHY;  Surgeon: Claudene Victory ORN, MD;  Location: [facility];  Service: Cardiovascular;  Laterality: N/A;   POLYPECTOMY  09/27/2022   Procedure: POLYPECTOMY;  Surgeon: Burnette Fallow, MD;  Location: [facility];  Service: Gastroenterology;;   POSTERIOR FUSION PEDICLE SCREW PLACEMENT N/A 07/26/2023   Procedure: POSTERIOR LUMBAR INTERBODY FUSION, INTERBODY PROSTHESIS,POSTERIOR INSTRUMENTATION LUMBAR FOUR TO LUMBAR FIVE;  Surgeon: Mavis Purchase, MD;  Location: [facility] OR;  Service: Neurosurgery;  Laterality: N/A;  3C   PROSTATE SURGERY  2010   Part of prostate removed due to enlarged   WRIST SURGERY     Right    Current Outpatient Medications  Medication Sig Dispense Refill   atorvastatin  (LIPITOR) 20 MG tablet Take 1 tablet (20 mg total) by mouth daily. 90 tablet 3   CALCIUM  PO Take 2 tablets by mouth every morning. Citracal- taking total of 400 mg  daily     diltiazem  (CARDIZEM  CD) 180 MG 24 hr capsule Take 1 capsule (180 mg total) by mouth daily. 90 capsule 3   docusate sodium  (COLACE) 100 MG capsule Take 1 capsule (100 mg total) by mouth 2 (two) times daily. (Patient not taking: Reported on 04/29/2024) 30 capsule 0   flecainide  (TAMBOCOR ) 50 MG tablet Take 1 tablet (50 mg total) by mouth 2 (two) times daily. 180 tablet 3   MAGNESIUM CITRATE PO Take 400 mg by mouth daily.     Multiple Vitamin (MULTIVITAMIN WITH MINERALS) TABS tablet Take 1 tablet  by mouth daily.     polyethylene glycol powder (GLYCOLAX/MIRALAX) 17 GM/SCOOP powder Take by mouth once.     rivaroxaban  (XARELTO ) 20 MG TABS tablet Take 1 tablet (20 mg total) by mouth daily with supper. 90 tablet 1   VITAMIN D, CHOLECALCIFEROL, PO Take 5,000 Units by mouth daily.     No current facility-administered medications for this visit.    Allergies  Allergen Reactions   Shellfish Allergy Anaphylaxis   Alfuzosin Other (See Comments)    Unknown   Simvastatin Other (See Comments)    Unknown   Tamsulosin Other (See Comments)    Unknown    Social History   Socioeconomic History   Marital status: Married    Spouse name: Happy   Number of children: 3   Years of education: MBA   Highest education level: Not on file  Occupational History   Not on file  Tobacco Use   Smoking status: Never   Smokeless tobacco: Never  Vaping Use   Vaping status: Never Used  Substance and Sexual Activity   Alcohol use: Not Currently    Comment: due to a-fib   Drug use: Never   Sexual activity: Yes  Other Topics Concern   Not on file  Social History Narrative   Lives with wife   Right handed   Caffeine use: 3 cups per day   Social Drivers of Health   Financial Resource Strain: Low Risk  (06/08/2023)   Received from [facility]   Overall Financial Resource Strain (CARDIA)    Difficulty of Paying Living Expenses: Not hard at all  Food Insecurity: No Food Insecurity (06/08/2023)   Received from [facility]   Hunger Vital Sign    Within the past 12 months, you worried that your food would run out before you got the money to buy more.: Never true    Within the past 12 months, the food you bought just didn't last and you didn't have money to get more.: Never true  Transportation Needs: No Transportation Needs (06/08/2023)   Received from [facility] - Transportation    In the past 12 months, has lack of transportation kept  you from medical appointments or from getting medications?: No    Lack of Transportation (Non-Medical): No  Physical Activity: Not on file  Stress: Not on file  Social Connections: Not on file  Intimate Partner Violence: Not on file    Family History  Problem Relation Age of Onset   Arrhythmia Mother    Hypertension Mother    Arthritis Mother    Other Father        tumor   Prostate cancer Father    CVA Neg Hx     Review of Systems:  As stated in the HPI and otherwise negative.   There were no vitals taken for this visit.  Physical Examination: General: Well developed, well nourished, NAD  HEENT: OP clear, mucus membranes moist  SKIN: warm, dry. No rashes. Neuro: No focal deficits  Musculoskeletal: Muscle strength 5/5 all ext  Psychiatric: Mood and affect normal  Neck: No JVD, no carotid bruits, no thyromegaly, no lymphadenopathy.  Lungs:Clear bilaterally, no wheezes, rhonci, crackles Cardiovascular: Regular rate and rhythm. No murmurs, gallops or rubs. Abdomen:Soft. Bowel sounds present. Non-tender.  Extremities: No lower extremity edema. Pulses are 2 + in the bilateral DP/PT.  EKG:  EKG {ACTION; IS/IS WNU:[id number]} ordered today. The ekg ordered today demonstrates ***  Recent Labs: 01/04/2024: BUN 16; Creatinine, Ser 0.99; Hemoglobin 15.1; Platelets 219; Potassium 4.0; Sodium 138   Lipid Panel No results found for: CHOL, TRIG, HDL, CHOLHDL, VLDL, LDLCALC, LDLDIRECT   Wt Readings from Last 3 Encounters:  03/18/24 150 lb 3.2 oz (68.1 kg)  01/21/24 146 lb 3.2 oz (66.3 kg)  01/04/24 140 lb (63.5 kg)    Assessment and Plan:   1. CAD without angina: He has an elevated coronary calcium  score (2206) by cardiac CT in July 2025. He has no chest pain or dyspnea. He is on Xarelto  and is not on an ASA.  -Continue Lipitor  2. HTN: BP is well controlled. Continue current therapy  3. HLD: LDL ***. Continue Lipitor  4. Persistent atrial fibrillation: Sinus  today. He is followed in the EP clinic. Continue Cardizem , Flecainide  and Xarelto .   5. Thoracic aortic aneurysm: Normal caliber ascending aorta by cardiac CT in July 2025. Echo in November 2025 suggests dilation of the aortic root/ascending aorta. *** Chest CTA  Labs/ tests ordered today include:  No orders of the defined types were placed in this encounter.  Disposition:   F/U with me in ***   Signed, Lonni Cash, MD, [facility] 04/30/2024 3:26 PM    [facility] [address] [place], [place], [place]  [address] Phone: [phone]; Fax: [phone]

## 2024-05-01 ENCOUNTER — Ambulatory Visit: Payer: Self-pay | Admitting: General Surgery

## 2024-05-01 ENCOUNTER — Other Ambulatory Visit: Payer: Self-pay | Admitting: *Deleted

## 2024-05-01 ENCOUNTER — Encounter: Payer: Self-pay | Admitting: Cardiovascular Disease

## 2024-05-01 ENCOUNTER — Encounter (HOSPITAL_COMMUNITY): Payer: Self-pay | Admitting: General Surgery

## 2024-05-01 ENCOUNTER — Other Ambulatory Visit: Payer: Self-pay

## 2024-05-01 ENCOUNTER — Ambulatory Visit: Attending: Cardiovascular Disease | Admitting: Cardiovascular Disease

## 2024-05-01 VITALS — BP 132/90 | HR 66 | Ht 67.5 in | Wt 152.2 lb

## 2024-05-01 DIAGNOSIS — I10 Essential (primary) hypertension: Secondary | ICD-10-CM | POA: Diagnosis not present

## 2024-05-01 DIAGNOSIS — I480 Paroxysmal atrial fibrillation: Secondary | ICD-10-CM | POA: Diagnosis not present

## 2024-05-01 DIAGNOSIS — Z01818 Encounter for other preprocedural examination: Secondary | ICD-10-CM | POA: Diagnosis not present

## 2024-05-01 DIAGNOSIS — I7121 Aneurysm of the ascending aorta, without rupture: Secondary | ICD-10-CM

## 2024-05-01 DIAGNOSIS — I2510 Atherosclerotic heart disease of native coronary artery without angina pectoris: Secondary | ICD-10-CM

## 2024-05-01 DIAGNOSIS — E7800 Pure hypercholesterolemia, unspecified: Secondary | ICD-10-CM

## 2024-05-01 LAB — ECHOCARDIOGRAM COMPLETE
AR max vel: 3.29 cm2
AV Area VTI: 3.63 cm2
AV Area mean vel: 3.13 cm2
AV Mean grad: 2 mmHg
AV Peak grad: 3.1 mmHg
Ao pk vel: 0.88 m/s
Area-P 1/2: 3.03 cm2
S' Lateral: 2.75 cm

## 2024-05-01 NOTE — Progress Notes (Signed)
 Surgical Instructions   Your procedure is scheduled on December 8. Report to [facility] Main Entrance A at 11:15 A.M., then check in with the Admitting office. Any questions or running late day of surgery: call [phone]  Questions prior to your surgery date: call [phone], Monday-Friday, 8am-4pm. If you experience any cold or flu symptoms such as cough, fever, chills, shortness of breath, etc. between now and your scheduled surgery, please notify us  at the above number.     Remember:  Do not eat after midnight the night before your surgery  You may drink clear liquids until 10:15am the morning of your surgery.   Clear liquids allowed are: Water, Non-Citrus Juices (without pulp), Carbonated Beverages, Clear Tea (no milk, honey, etc.), Black Coffee Only (NO MILK, CREAM OR POWDERED CREAMER of any kind), and Gatorade.    Take these medicines the morning of surgery with A SIP OF WATER  atorvastatin  (LIPITOR)  diltiazem  (CARDIZEM  CD)  flecainide  (TAMBOCOR )   May take these medicines IF NEEDED:none  One week prior to surgery, STOP taking any Aspirin  (unless otherwise instructed by your surgeon) Aleve, Naproxen, Ibuprofen, Motrin, Advil, Goody's, BC's, all herbal medications, fish oil, and non-prescription vitamins.          PER CARDIOLOGY'S INSTRUCTIONS HOLD XARELTO  2 DAYS PRIOR TO SURGERY. YOU WILL TAKE YOUR LAST DOSE ON 05/02/24.            Do NOT Smoke (Tobacco/Vaping) for 24 hours prior to your procedure.  If you use a CPAP at night, you may bring your mask/headgear for your overnight stay.   You will be asked to remove any contacts, glasses, piercing's, hearing aid's, dentures/partials prior to surgery. Please bring cases for these items if needed.    Patients discharged the day of surgery will not be allowed to drive home, and someone needs to stay with them for 24 hours.  SURGICAL WAITING ROOM VISITATION Patients may have no more than 2 support people in the waiting  area - these visitors may rotate.   Pre-op nurse will coordinate an appropriate time for 1 ADULT support person, who may not rotate, to accompany patient in pre-op.  Children under the age of 3 must have an adult with them who is not the patient and must remain in the main waiting area with an adult.  If the patient needs to stay at the hospital during part of their recovery, the visitor guidelines for inpatient rooms apply.  Please refer to the [facility] website for the visitor guidelines for any additional information.   If you received a COVID test during your pre-op visit  it is requested that you wear a mask when out in public, stay away from anyone that may not be feeling well and notify your surgeon if you develop symptoms. If you have been in contact with anyone that has tested positive in the last 10 days please notify you surgeon.      Pre-operative CHG Bathing Instructions   You can play a key role in reducing the risk of infection after surgery. Your skin needs to be as free of germs as possible. You can reduce the number of germs on your skin by washing with CHG (chlorhexidine  gluconate) soap before surgery. CHG is an antiseptic soap that kills germs and continues to kill germs even after washing.   DO NOT use if you have an allergy to chlorhexidine /CHG or antibacterial soaps. If your skin becomes reddened or irritated, stop using the CHG and notify  one of our RNs at [phone].              TAKE A SHOWER THE NIGHT BEFORE SURGERY   Please keep in mind the following:  DO NOT shave, including legs and underarms, 48 hours prior to surgery.   You may shave your face before/day of surgery.  Place clean sheets on your bed the night before surgery Use a clean washcloth (not used since being washed) for shower. DO NOT sleep with pet's night before surgery.  CHG Shower Instructions:  Wash your face and private area with normal soap. If you choose to wash your hair, wash first  with your normal shampoo.  After you use shampoo/soap, rinse your hair and body thoroughly to remove shampoo/soap residue.  Turn the water OFF and apply half the bottle of CHG soap to a CLEAN washcloth.  Apply CHG soap ONLY FROM YOUR NECK DOWN TO YOUR TOES (washing for 3-5 minutes)  DO NOT use CHG soap on face, private areas, open wounds, or sores.  Pay special attention to the area where your surgery is being performed.  If you are having back surgery, having someone wash your back for you may be helpful. Wait 2 minutes after CHG soap is applied, then you may rinse off the CHG soap.  Pat dry with a clean towel  Put on clean pajamas    Additional instructions for the day of surgery: If you choose, you may shower the morning of surgery with an antibacterial soap.  DO NOT APPLY any lotions, deodorants, cologne, or perfumes.   Do not wear jewelry or makeup Do not wear nail polish, gel polish, artificial nails, or any other type of covering on natural nails (fingers and toes) Do not bring valuables to the hospital. [facility] is not responsible for valuables/personal belongings. Put on clean/comfortable clothes.  Please brush your teeth.  Ask your nurse before applying any prescription medications to the skin.

## 2024-05-01 NOTE — Patient Instructions (Addendum)
 Medication Instructions:  Your physician recommends that you continue on your current medications as directed. Please refer to the Current Medication list given to you today.  *If you need a refill on your cardiac medications before your next appointment, please call your pharmacy*  Lab Work: Have lab work (BMP) done week or so prior to CT scan.  Can be done at the [facility] on the first floor of our building or any [facility] If you have labs (blood work) drawn today and your tests are completely normal, you will receive your results only by: MyChart Message (if you have MyChart) OR A paper copy in the mail If you have any lab test that is abnormal or we need to change your treatment, we will call you to review the results.  Testing/Procedures: Non-Cardiac CT Angiography (CTA), is a special type of CT scan that uses a computer to produce multi-dimensional views of major blood vessels throughout the body. In CT angiography, a contrast material is injected through an IV to help visualize the blood vessels .  To be done in January 2026  Follow-Up: At [facility], you and your health needs are our priority.  As part of our continuing mission to provide you with exceptional heart care, our providers are all part of one team.  This team includes your primary Cardiologist (physician) and Advanced Practice Providers or APPs (Physician Assistants and Nurse Practitioners) who all work together to provide you with the care you need, when you need it.  Your next appointment:   12 month(s)  Provider:   Lonni Cash, MD    We recommend signing up for the patient portal called MyChart.  Sign up information is provided on this After Visit Summary.  MyChart is used to connect with patients for Virtual Visits (Telemedicine).  Patients are able to view lab/test results, encounter notes, upcoming appointments, etc.  Non-urgent messages can be sent to your provider as well.   To learn more about  what you can do with MyChart, go to [email or web address].   Other Instructions      Heart-Healthy Eating Plan Eating a healthy diet is important for the health of your heart. A heart-healthy eating plan includes: Eating less unhealthy fats. Eating more healthy fats. Eating less salt in your food. Salt is also called sodium. Making other changes in your diet. Talk with your doctor or a diet specialist (dietitian) to create an eating plan that is right for you. What is my plan? Your doctor may recommend an eating plan that includes: Total fat: ______% or less of total calories a day. Saturated fat: ______% or less of total calories a day. Cholesterol: less than _________mg a day. Sodium: less than _________mg a day. What are tips for following this plan? Cooking Avoid frying your food. Try to bake, boil, grill, or broil it instead. You can also reduce fat by: Removing the skin from poultry. Removing all visible fats from meats. Steaming vegetables in water or broth. Meal planning  At meals, divide your plate into four equal parts: Fill one-half of your plate with vegetables and green salads. Fill one-fourth of your plate with whole grains. Fill one-fourth of your plate with lean protein foods. Eat 2-4 cups of vegetables per day. One cup of vegetables is: 1 cup (91 g) broccoli or cauliflower florets. 2 medium carrots. 1 large bell pepper. 1 large sweet potato. 1 large tomato. 1 medium white potato. 2 cups (150 g) raw leafy greens. Eat 1-2 cups of fruit  per day. One cup of fruit is: 1 small apple 1 large banana 1 cup (237 g) mixed fruit, 1 large orange,  cup (82 g) dried fruit, 1 cup (240 mL) 100% fruit juice. Eat more foods that have soluble fiber. These are apples, broccoli, carrots, beans, peas, and barley. Try to get 20-30 g of fiber per day. Eat 4-5 servings of nuts, legumes, and seeds per week: 1 serving of dried beans or legumes equals  cup (90 g) cooked. 1  serving of nuts is  oz (12 almonds, 24 pistachios, or 7 walnut halves). 1 serving of seeds equals  oz (8 g). General information Eat more home-cooked food. Eat less restaurant, buffet, and fast food. Limit or avoid alcohol. Limit foods that are high in starch and sugar. Avoid fried foods. Lose weight if you are overweight. Keep track of how much salt (sodium) you eat. This is important if you have high blood pressure. Ask your doctor to tell you more about this. Try to add vegetarian meals each week. Fats Choose healthy fats. These include olive oil and canola oil, flaxseeds, walnuts, almonds, and seeds. Eat more omega-3 fats. These include salmon, mackerel, sardines, tuna, flaxseed oil, and ground flaxseeds. Try to eat fish at least 2 times each week. Check food labels. Avoid foods with trans fats or high amounts of saturated fat. Limit saturated fats. These are often found in animal products, such as meats, butter, and cream. These are also found in plant foods, such as palm oil, palm kernel oil, and coconut oil. Avoid foods with partially hydrogenated oils in them. These have trans fats. Examples are stick margarine, some tub margarines, cookies, crackers, and other baked goods. What foods should I eat? Fruits All fresh, canned (in natural juice), or frozen fruits. Vegetables Fresh or frozen vegetables (raw, steamed, roasted, or grilled). Green salads. Grains Most grains. Choose whole wheat and whole grains most of the time. Rice and pasta, including brown rice and pastas made with whole wheat. Meats and other proteins Lean, well-trimmed beef, veal, pork, and lamb. Chicken and turkey without skin. All fish and shellfish. Wild duck, rabbit, pheasant, and venison. Egg whites or low-cholesterol egg substitutes. Dried beans, peas, lentils, and tofu. Seeds and most nuts. Dairy Low-fat or nonfat cheeses, including ricotta and mozzarella. Skim or 1% milk that is liquid, powdered, or  evaporated. Buttermilk that is made with low-fat milk. Nonfat or low-fat yogurt. Fats and oils Non-hydrogenated (trans-free) margarines. Vegetable oils, including soybean, sesame, sunflower, olive, peanut, safflower, corn, canola, and cottonseed. Salad dressings or mayonnaise made with a vegetable oil. Beverages Mineral water. Coffee and tea. Diet carbonated beverages. Sweets and desserts Sherbet, gelatin, and fruit ice. Small amounts of dark chocolate. Limit all sweets and desserts. Seasonings and condiments All seasonings and condiments. The items listed above may not be a complete list of foods and drinks you can eat. Contact a dietitian for more options. What foods should I avoid? Fruits Canned fruit in heavy syrup. Fruit in cream or butter sauce. Fried fruit. Limit coconut. Vegetables Vegetables cooked in cheese, cream, or butter sauce. Fried vegetables. Grains Breads that are made with saturated or trans fats, oils, or whole milk. Croissants. Sweet rolls. Donuts. High-fat crackers, such as cheese crackers. Meats and other proteins Fatty meats, such as hot dogs, ribs, sausage, bacon, rib-eye roast or steak. High-fat deli meats, such as salami and bologna. Caviar. Domestic duck and goose. Organ meats, such as liver. Dairy Cream, sour cream, cream cheese, and creamed cottage  cheese. Whole-milk cheeses. Whole or 2% milk that is liquid, evaporated, or condensed. Whole buttermilk. Cream sauce or high-fat cheese sauce. Yogurt that is made from whole milk. Fats and oils Meat fat, or shortening. Cocoa butter, hydrogenated oils, palm oil, coconut oil, palm kernel oil. Solid fats and shortenings, including bacon fat, salt pork, lard, and butter. Nondairy cream substitutes. Salad dressings with cheese or sour cream. Beverages Regular sodas and juice drinks with added sugar. Sweets and desserts Frosting. Pudding. Cookies. Cakes. Pies. Milk chocolate or white chocolate. Buttered syrups. Full-fat  ice cream or ice cream drinks. The items listed above may not be a complete list of foods and drinks to avoid. Contact a dietitian for more information. Summary Heart-healthy meal planning includes eating less unhealthy fats, eating more healthy fats, and making other changes in your diet. Eat a balanced diet. This includes fruits and vegetables, low-fat or nonfat dairy, lean protein, nuts and legumes, whole grains, and heart-healthy oils and fats. This information is not intended to replace advice given to you by your health care provider. Make sure you discuss any questions you have with your health care provider. Document Revised: 06/20/2021 Document Reviewed: 06/20/2021 Elsevier Patient Education  2024 [employer].

## 2024-05-01 NOTE — Progress Notes (Signed)
 PCP - Dr Darice Henle Cardiologist - Dr Lonni Cash  EP - Dr Eulas Furbish  CT Chest x-ray - 04/10/24 EKG - 05/01/24 Stress Test - 07/05/17 ECHO - 04/21/24 Cardiac Cath - 06/27/17  [employer] Loop Recorder - Last remote device check was on 04/13/24, normal.  Sleep Study -  n/a  Diabetes - n/a  Aspirin  - n/a  Xarelto  Instructions:  Hold 2 days prior to surgery per Governor Barrack, NP.  Last dose will be on Friday, 05/02/24.  ERAS - clear liquids til 10:15 AM DOS.  Anesthesia review: Yes   STOP now taking any Aspirin  (unless otherwise instructed by your surgeon), Aleve, Naproxen, Ibuprofen, Motrin, Advil, Goody's, BC's, all herbal medications, fish oil, and all vitamins.   Coronavirus Screening Do you have any of the following symptoms:  Cough yes/no: No Fever (>100.28F)  yes/no: No Runny nose yes/no: No Sore throat yes/no: No Difficulty breathing/shortness of breath  yes/no: No  Have you traveled in the last 14 days and where? yes/no: No  Patient verbalized understanding of instructions that were given via phone.

## 2024-05-01 NOTE — Progress Notes (Signed)
 error

## 2024-05-02 ENCOUNTER — Inpatient Hospital Stay (HOSPITAL_COMMUNITY): Admission: RE | Admit: 2024-05-02 | Discharge: 2024-05-02

## 2024-05-02 ENCOUNTER — Encounter: Payer: Self-pay | Admitting: Cardiovascular Disease

## 2024-05-02 NOTE — Progress Notes (Signed)
 Anesthesia Chart Review: Same day workup  78 year old male follows with cardiology for history of carotid artery disease, CVA in setting of carotid artery dissection ~2001, HLD, persistent atrial fibrillation s/p ablation 7/25, HTN, CAD (abnormal coronary calcium  score, normal coronaries by cath 2019) and mitral regurgitation.  He has a loop recorder in place.  Echo 03/2024 with LVEF 55 to 60%, mild MR, mild AI, mild dilation of ascending aorta.  Last seen by Dr. Verlin 05/01/2024 and reported feeling well, no exertional chest pain or dyspnea.  Noted to be in sinus rhythm.  He was continued on Cardizem , flecainide , and Xarelto .  Upcoming surgery was also discussed.  Per note, No exertional chest pain. EKG is normal. Recent normal LV function by echo. Cardiac exam is normal. He can proceed with his planned surgical procedure.  He had previously been cleared by EP cardiology to hold Xarelto  2 days prior to surgery.  He reports last dose Xarelto  05/02/2024.  Patient will need day of surgery labs and evaluation.  EKG 05/01/2024: Sinus rhythm with first-degree AV block with occasional PVCs.  Rate 66.  TTE 04/21/2024:  1. Left ventricular ejection fraction, by estimation, is 55 to 60%. Left  ventricular ejection fraction by 3D volume is 58 %. The left ventricle has  normal function. The left ventricle has no regional wall motion  abnormalities. There is mild asymmetric  left ventricular hypertrophy of the basal-septal segment. Left ventricular  diastolic parameters are consistent with Grade I diastolic dysfunction  (impaired relaxation). The average left ventricular global longitudinal  strain is -11.5 %. The global  longitudinal strain is abnormal.   2. Right ventricular systolic function is normal. The right ventricular  size is normal. There is normal pulmonary artery systolic pressure.   3. Left atrial size was moderately dilated.   4. The mitral valve is normal in structure. Mild mitral valve   regurgitation. No evidence of mitral stenosis.   5. The aortic valve is tricuspid. Aortic valve regurgitation is mild. No  aortic stenosis is present.   6. Aortic dilatation noted. There is mild dilatation of the aortic root,  measuring 43 mm. There is mild dilatation of the ascending aorta,  measuring 43 mm.   7. The inferior vena cava is normal in size with greater than 50%  respiratory variability, suggesting right atrial pressure of 3 mmHg.   Comparison(s): EF 55%, AOR 44 mm, mild MAC, trivial AI.     Lynwood Geofm RIGGERS [facility] Short Stay Center/Anesthesiology Phone [phone] 05/02/2024 3:56 PM

## 2024-05-02 NOTE — Anesthesia Preprocedure Evaluation (Addendum)
 Anesthesia Evaluation  Patient identified by MRN, date of birth, ID band Patient awake    Reviewed: Allergy & Precautions, NPO status , Patient's Chart, lab work & pertinent test results  Airway Mallampati: II  TM Distance: >3 FB Neck ROM: Full    Dental no notable dental hx.    Pulmonary neg pulmonary ROS   Pulmonary exam normal        Cardiovascular hypertension, + Peripheral Vascular Disease  + dysrhythmias (loop recorder in situ, Xarelto ) Atrial Fibrillation  Rhythm:Regular Rate:Normal   1. Left ventricular ejection fraction, by estimation, is 55 to 60%. Left ventricular ejection fraction by 3D volume is 58 %. The left ventricle has normal function. The left ventricle has no regional wall motion abnormalities. There is mild asymmetric left ventricular hypertrophy of the basal-septal segment. Left ventricular diastolic parameters are consistent with Grade I diastolic dysfunction (impaired relaxation). The average left ventricular global longitudinal strain is -11.5 %. The global longitudinal strain is abnormal.  2. Right ventricular systolic function is normal. The right ventricular size is normal. There is normal pulmonary artery systolic pressure.  3. Left atrial size was moderately dilated.  4. The mitral valve is normal in structure. Mild mitral valve regurgitation. No evidence of mitral stenosis.  5. The aortic valve is tricuspid. Aortic valve regurgitation is mild. No aortic stenosis is present.  6. Aortic dilatation noted. There is mild dilatation of the aortic root, measuring 43 mm. There is mild dilatation of the ascending aorta, measuring 43 mm.  7. The inferior vena cava is normal in size with greater than 50% respiratory variability, suggesting right atrial pressure of 3 mmHg.   Comparison(s): EF 55%, AOR 44 mm, mild MAC, trivial AI.    Neuro/Psych CVA, No Residual Symptoms  negative psych ROS   GI/Hepatic    Endo/Other    Renal/GU   negative genitourinary   Musculoskeletal  (+) Arthritis , Osteoarthritis,    Abdominal Normal abdominal exam  (+)   Peds  Hematology Lab Results      Component                Value               Date                      WBC                      8.0                 05/05/2024                HGB                      15.7                05/05/2024                HCT                      45.6                05/05/2024                MCV                      93.3  05/05/2024                PLT                      200                 05/05/2024             Lab Results      Component                Value               Date                      NA                       137                 05/05/2024                K                        3.8                 05/05/2024                CO2                      24                  05/05/2024                GLUCOSE                  90                  05/05/2024                BUN                      15                  05/05/2024                CREATININE               1.00                05/05/2024                CALCIUM                   8.6 (L)             05/05/2024                EGFR                     75                  11/26/2023                GFRNONAA                 >60                 05/05/2024  Anesthesia Other Findings   Reproductive/Obstetrics                              Anesthesia Physical Anesthesia Plan  ASA: 3  Anesthesia Plan: General   Post-op Pain Management: Tylenol  PO (pre-op)*   Induction: Intravenous  PONV Risk Score and Plan: 2 and Ondansetron , Dexamethasone  and Treatment may vary due to age or medical condition  Airway Management Planned: Oral ETT  Additional Equipment: None  Intra-op Plan:   Post-operative Plan: Extubation in OR  Informed Consent: I have reviewed the patients History and Physical, chart, labs  and discussed the procedure including the risks, benefits and alternatives for the proposed anesthesia with the patient or authorized representative who has indicated his/her understanding and acceptance.       Plan Discussed with: CRNA  Anesthesia Plan Comments: (PAT note by Lynwood Hope, PA-C: 78 year old male follows with cardiology for history of carotid artery disease, CVA in setting of carotid artery dissection ~2001, HLD, persistent atrial fibrillation s/p ablation 7/25, HTN, CAD (abnormal coronary calcium  score, normal coronaries by cath 2019) and mitral regurgitation.  He has a loop recorder in place.  Echo 03/2024 with LVEF 55 to 60%, mild MR, mild AI, mild dilation of ascending aorta.  Last seen by Dr. Verlin 05/01/2024 and reported feeling well, no exertional chest pain or dyspnea.  Noted to be in sinus rhythm.  He was continued on Cardizem , flecainide , and Xarelto .  Upcoming surgery was also discussed.  Per note, No exertional chest pain. EKG is normal. Recent normal LV function by echo. Cardiac exam is normal. He can proceed with his planned surgical procedure.  He had previously been cleared by EP cardiology to hold Xarelto  2 days prior to surgery.  He reports last dose Xarelto  05/02/2024.  Patient will need day of surgery labs and evaluation.  EKG 05/01/2024: Sinus rhythm with first-degree AV block with occasional PVCs.  Rate 66.  TTE 04/21/2024: 1. Left ventricular ejection fraction, by estimation, is 55 to 60%. Left  ventricular ejection fraction by 3D volume is 58 %. The left ventricle has  normal function. The left ventricle has no regional wall motion  abnormalities. There is mild asymmetric  left ventricular hypertrophy of the basal-septal segment. Left ventricular  diastolic parameters are consistent with Grade I diastolic dysfunction  (impaired relaxation). The average left ventricular global longitudinal  strain is -11.5 %. The global  longitudinal strain is  abnormal.  2. Right ventricular systolic function is normal. The right ventricular  size is normal. There is normal pulmonary artery systolic pressure.  3. Left atrial size was moderately dilated.  4. The mitral valve is normal in structure. Mild mitral valve  regurgitation. No evidence of mitral stenosis.  5. The aortic valve is tricuspid. Aortic valve regurgitation is mild. No  aortic stenosis is present.  6. Aortic dilatation noted. There is mild dilatation of the aortic root,  measuring 43 mm. There is mild dilatation of the ascending aorta,  measuring 43 mm.  7. The inferior vena cava is normal in size with greater than 50%  respiratory variability, suggesting right atrial pressure of 3 mmHg.   Comparison(s): EF 55%, AOR 44 mm, mild MAC, trivial AI.   )         Anesthesia Quick Evaluation

## 2024-05-05 ENCOUNTER — Ambulatory Visit (HOSPITAL_COMMUNITY): Payer: Self-pay | Admitting: Physician Assistant

## 2024-05-05 ENCOUNTER — Ambulatory Visit (HOSPITAL_COMMUNITY)
Admission: RE | Admit: 2024-05-05 | Discharge: 2024-05-05 | Disposition: A | Attending: General Surgery | Admitting: General Surgery

## 2024-05-05 ENCOUNTER — Encounter: Admission: RE | Disposition: A | Payer: Self-pay | Attending: General Surgery

## 2024-05-05 ENCOUNTER — Encounter: Payer: Self-pay | Admitting: Neurosurgery

## 2024-05-05 HISTORY — PX: INGUINAL HERNIA REPAIR: SHX194

## 2024-05-05 LAB — BASIC METABOLIC PANEL WITH GFR
Anion gap: 9 (ref 5–15)
BUN: 15 mg/dL (ref 8–23)
CO2: 24 mmol/L (ref 22–32)
Calcium: 8.6 mg/dL — ABNORMAL LOW (ref 8.9–10.3)
Chloride: 104 mmol/L (ref 98–111)
Creatinine, Ser: 1 mg/dL (ref 0.61–1.24)
GFR, Estimated: >60 mL/min (ref >60)
Glucose, Bld: 90 mg/dL (ref 70–99)
Potassium: 3.8 mmol/L (ref 3.5–5.1)
Sodium: 137 mmol/L (ref 135–145)

## 2024-05-05 LAB — CBC
HCT: 45.6 % (ref 39.0–52.0)
Hemoglobin: 15.7 g/dL (ref 13.0–17.0)
MCH: 32.1 pg (ref 26.0–34.0)
MCHC: 34.4 g/dL (ref 30.0–36.0)
MCV: 93.3 fL (ref 80.0–100.0)
Platelets: 200 K/uL (ref 150–400)
RBC: 4.89 MIL/uL (ref 4.22–5.81)
RDW: 13 % (ref 11.5–15.5)
WBC: 8 K/uL (ref 4.0–10.5)
nRBC: 0 % (ref 0.0–0.2)

## 2024-05-05 SURGERY — REPAIR, HERNIA, INGUINAL, LAPAROSCOPIC
Anesthesia: General | Laterality: Right

## 2024-05-05 MED ORDER — ACETAMINOPHEN 500 MG PO TABS
1000.0000 mg | ORAL_TABLET | ORAL | Status: AC
  Administered 2024-05-05: 1000 mg via ORAL
  Filled 2024-05-05: qty 2

## 2024-05-05 MED ORDER — GLYCOPYRROLATE 0.2 MG/ML IJ SOLN
INTRAMUSCULAR | Status: DC | PRN
  Administered 2024-05-05: .2 mg via INTRAVENOUS

## 2024-05-05 MED ORDER — ROCURONIUM BROMIDE 10 MG/ML (PF) SYRINGE
PREFILLED_SYRINGE | INTRAVENOUS | Status: AC
  Filled 2024-05-05: qty 20

## 2024-05-05 MED ORDER — ORAL CARE MOUTH RINSE: 15.0000 mL | Freq: Once | OROMUCOSAL | Status: AC

## 2024-05-05 MED ORDER — LIDOCAINE 2% (20 MG/ML) 5 ML SYRINGE
INTRAMUSCULAR | Status: AC
  Filled 2024-05-05: qty 10

## 2024-05-05 MED ORDER — ONDANSETRON HCL 4 MG/2ML IJ SOLN
INTRAMUSCULAR | Status: AC
  Filled 2024-05-05: qty 6

## 2024-05-05 MED ORDER — LACTATED RINGERS IV SOLN: INTRAVENOUS | Status: DC

## 2024-05-05 MED ORDER — GLYCOPYRROLATE PF 0.2 MG/ML IJ SOSY
PREFILLED_SYRINGE | INTRAMUSCULAR | Status: AC
  Filled 2024-05-05: qty 2

## 2024-05-05 MED ORDER — CHLORHEXIDINE GLUCONATE 0.12 % MT SOLN
15.0000 mL | Freq: Once | OROMUCOSAL | Status: AC
  Administered 2024-05-05: 15 mL via OROMUCOSAL
  Filled 2024-05-05: qty 15

## 2024-05-05 MED ORDER — LIDOCAINE 2% (20 MG/ML) 5 ML SYRINGE
INTRAMUSCULAR | Status: AC
  Filled 2024-05-05: qty 5

## 2024-05-05 MED ORDER — ROCURONIUM BROMIDE 10 MG/ML (PF) SYRINGE
PREFILLED_SYRINGE | INTRAVENOUS | Status: AC
  Filled 2024-05-05: qty 10

## 2024-05-05 MED ORDER — CEFAZOLIN SODIUM-DEXTROSE 2-4 GM/100ML-% IV SOLN
2.0000 g | INTRAVENOUS | Status: DC
  Filled 2024-05-05: qty 100

## 2024-05-05 MED ORDER — FENTANYL CITRATE (PF) 250 MCG/5ML IJ SOLN
INTRAMUSCULAR | Status: AC
  Filled 2024-05-05: qty 5

## 2024-05-05 MED ORDER — ONDANSETRON HCL 4 MG/2ML IJ SOLN
INTRAMUSCULAR | Status: AC
  Filled 2024-05-05: qty 2

## 2024-05-05 MED ORDER — PROPOFOL 10 MG/ML IV BOLUS
INTRAVENOUS | Status: DC | PRN
  Administered 2024-05-05: 130 mg via INTRAVENOUS

## 2024-05-05 MED ORDER — CHLORHEXIDINE GLUCONATE CLOTH 2 % EX PADS
6.0000 | MEDICATED_PAD | Freq: Once | CUTANEOUS | Status: AC
  Administered 2024-05-05: 6 via TOPICAL

## 2024-05-05 MED ORDER — ENSURE PRE-SURGERY PO LIQD: 296.0000 mL | Freq: Once | ORAL | Status: DC

## 2024-05-05 MED ORDER — BUPIVACAINE HCL 0.25 % IJ SOLN
INTRAMUSCULAR | Status: DC | PRN
  Administered 2024-05-05: 7 mL

## 2024-05-05 MED ORDER — CEFAZOLIN SODIUM-DEXTROSE 2-3 GM-%(50ML) IV SOLR
INTRAVENOUS | Status: DC | PRN
  Administered 2024-05-05: 2 g via INTRAVENOUS

## 2024-05-05 MED ORDER — FENTANYL CITRATE (PF) 250 MCG/5ML IJ SOLN
INTRAMUSCULAR | Status: DC | PRN
  Administered 2024-05-05: 100 ug via INTRAVENOUS

## 2024-05-05 MED ORDER — FENTANYL CITRATE (PF) 100 MCG/2ML IJ SOLN
INTRAMUSCULAR | Status: AC
  Filled 2024-05-05: qty 2

## 2024-05-05 MED ORDER — ATROPINE SULFATE (PF) 0.4 MG/ML IJ SOLN
INTRAMUSCULAR | Status: AC
  Filled 2024-05-05: qty 2

## 2024-05-05 MED ORDER — EPHEDRINE 5 MG/ML INJ
INTRAVENOUS | Status: AC
  Filled 2024-05-05: qty 5

## 2024-05-05 MED ORDER — 0.9 % SODIUM CHLORIDE (POUR BTL) OPTIME
TOPICAL | Status: DC | PRN
  Administered 2024-05-05: 1000 mL

## 2024-05-05 MED ORDER — ONDANSETRON HCL 4 MG/2ML IJ SOLN
INTRAMUSCULAR | Status: DC | PRN
  Administered 2024-05-05: 4 mg via INTRAVENOUS

## 2024-05-05 MED ORDER — BUPIVACAINE HCL (PF) 0.25 % IJ SOLN
INTRAMUSCULAR | Status: AC
  Filled 2024-05-05: qty 30

## 2024-05-05 MED ORDER — TRAMADOL HCL 50 MG PO TABS: 50.0000 mg | ORAL_TABLET | Freq: Four times a day (QID) | ORAL | 0 refills | Status: AC | PRN

## 2024-05-05 MED ORDER — PROPOFOL 10 MG/ML IV BOLUS
INTRAVENOUS | Status: AC
  Filled 2024-05-05: qty 20

## 2024-05-05 MED ORDER — LACTATED RINGERS IV SOLN: INTRAVENOUS | Status: DC | PRN

## 2024-05-05 MED ORDER — ROCURONIUM BROMIDE 10 MG/ML (PF) SYRINGE
PREFILLED_SYRINGE | INTRAVENOUS | Status: DC | PRN
  Administered 2024-05-05: 50 mg via INTRAVENOUS

## 2024-05-05 MED ORDER — PHENYLEPHRINE 80 MCG/ML (10ML) SYRINGE FOR IV PUSH (FOR BLOOD PRESSURE SUPPORT)
PREFILLED_SYRINGE | INTRAVENOUS | Status: AC
  Filled 2024-05-05: qty 20

## 2024-05-05 MED ORDER — EPHEDRINE SULFATE-NACL 50-0.9 MG/10ML-% IV SOSY
PREFILLED_SYRINGE | INTRAVENOUS | Status: DC | PRN
  Administered 2024-05-05: 10 mg via INTRAVENOUS

## 2024-05-05 MED ORDER — SUGAMMADEX SODIUM 200 MG/2ML IV SOLN
INTRAVENOUS | Status: DC | PRN
  Administered 2024-05-05: 400 mg via INTRAVENOUS

## 2024-05-05 MED ORDER — CHLORHEXIDINE GLUCONATE CLOTH 2 % EX PADS: 6.0000 | MEDICATED_PAD | Freq: Once | CUTANEOUS | Status: DC

## 2024-05-05 SURGICAL SUPPLY — 31 items
BAG COUNTER SPONGE SURGICOUNT (BAG) ×1 IMPLANT
CANISTER SUCTION 3000ML PPV (SUCTIONS) IMPLANT
COVER SURGICAL LIGHT HANDLE (MISCELLANEOUS) ×1 IMPLANT
DERMABOND ADVANCED .7 DNX12 (GAUZE/BANDAGES/DRESSINGS) ×1 IMPLANT
DISSECTOR BLUNT TIP ENDO 5MM (MISCELLANEOUS) IMPLANT
ELECTRODE REM PT RTRN 9FT ADLT (ELECTROSURGICAL) ×1 IMPLANT
ENDOLOOP SUT PDS II 0 18 (SUTURE) IMPLANT
GLOVE BIO SURGEON STRL SZ7.5 (GLOVE) ×2 IMPLANT
GOWN STRL REUS W/ TWL LRG LVL3 (GOWN DISPOSABLE) ×2 IMPLANT
GOWN STRL REUS W/ TWL XL LVL3 (GOWN DISPOSABLE) ×1 IMPLANT
IRRIGATION SUCT STRKRFLW 2 WTP (MISCELLANEOUS) IMPLANT
KIT BASIN OR (CUSTOM PROCEDURE TRAY) ×1 IMPLANT
KIT TURNOVER KIT B (KITS) ×1 IMPLANT
MESH 3DMAX 4X6 RT LRG (Mesh General) IMPLANT
NDL INSUFFLATION 14GA 120MM (NEEDLE) IMPLANT
PAD ARMBOARD POSITIONER FOAM (MISCELLANEOUS) ×2 IMPLANT
RELOAD STAPLE 4.0 BLU F/HERNIA (INSTRUMENTS) IMPLANT
SCISSORS LAP 5X35 DISP (ENDOMECHANICALS) ×1 IMPLANT
SET TUBE SMOKE EVAC HIGH FLOW (TUBING) ×1 IMPLANT
SOLN 0.9% NACL POUR BTL 1000ML (IV SOLUTION) ×1 IMPLANT
SOLN STERILE WATER BTL 1000 ML (IV SOLUTION) ×1 IMPLANT
STAPLER HERNIA 12 8.5 360D (INSTRUMENTS) IMPLANT
SUT MNCRL AB 4-0 PS2 18 (SUTURE) ×1 IMPLANT
SUT VIC AB 1 CT1 27XBRD ANBCTR (SUTURE) IMPLANT
TOWEL GREEN STERILE (TOWEL DISPOSABLE) ×1 IMPLANT
TOWEL GREEN STERILE FF (TOWEL DISPOSABLE) ×1 IMPLANT
TRAY LAPAROSCOPIC MC (CUSTOM PROCEDURE TRAY) ×1 IMPLANT
TROCAR OPTICAL SHORT 5MM (TROCAR) ×1 IMPLANT
TROCAR OPTICAL SLV SHORT 5MM (TROCAR) ×1 IMPLANT
TROCAR Z THREAD OPTICAL 12X100 (TROCAR) ×1 IMPLANT
WARMER LAPAROSCOPE (MISCELLANEOUS) ×1 IMPLANT

## 2024-05-05 NOTE — Transfer of Care (Signed)
 Immediate Anesthesia Transfer of Care Note  Patient: Billy Carpenter  Procedure(s) Performed: REPAIR, HERNIA, INGUINAL, LAPAROSCOPIC (Right)  Patient Location: PACU  Anesthesia Type:General  Level of Consciousness: drowsy  Airway & Oxygen Therapy: Patient Spontanous Breathing and Patient connected to nasal cannula oxygen  Post-op Assessment: Report given to RN and Post -op Vital signs reviewed and stable  Post vital signs: Reviewed and stable  Last Vitals:  Vitals Value Taken Time  BP 117/79 05/05/24 14:30  Temp    Pulse 96 05/05/24 14:30  Resp 19 05/05/24 14:30  SpO2 100 % 05/05/24 14:30  Vitals shown include unfiled device data.  Last Pain:  Vitals:   05/05/24 1131  TempSrc:   PainSc: 0-No pain         Complications: No notable events documented.

## 2024-05-05 NOTE — Discharge Instructions (Signed)

## 2024-05-05 NOTE — H&P (Signed)
 Chief Complaint: New Problem  (Right inguinal hernia. Pt normally sees Dr. Signe)       History of Present Illness: Billy Carpenter is a 78 y.o. male who is seen today as an office consultation at the request of Dr. Arch for evaluation of New Problem  (Right inguinal hernia. Pt normally sees Dr. Signe) .   History of Present Illness Billy Carpenter is a 78 year old male who presents for surgical consultation regarding a hernia. He was referred by Dr. Burney for evaluation of his hernia.   He has had a right-sided hernia for over two years, which is increasing in size and causing more pain. The hernia does not reduce significantly when he lies down. He underwent a previous hernia repair in 2008, though the method is unknown.   He underwent an ablation procedure on December 24, 2023, and is currently on Xarelto  for 90 days post-procedure. He has a follow-up appointment on March 18, 2024, to potentially receive clearance to discontinue Xarelto .       Review of Systems: A complete review of systems was obtained from the patient.  I have reviewed this information and discussed as appropriate with the patient.  See HPI as well for other ROS.   Review of Systems  Constitutional:  Negative for fever.  HENT:  Negative for congestion.   Eyes:  Negative for blurred vision.  Respiratory:  Negative for cough, shortness of breath and wheezing.   Cardiovascular:  Negative for chest pain and palpitations.  Gastrointestinal:  Negative for heartburn.  Genitourinary:  Negative for dysuria.  Musculoskeletal:  Negative for myalgias.  Skin:  Negative for rash.  Neurological:  Negative for dizziness and headaches.  Psychiatric/Behavioral:  Negative for depression and suicidal ideas.   All other systems reviewed and are negative.       Medical History:     Past Medical History:  Diagnosis Date   A-fib (CMS/HHS-HCC)     Anticoagulated 09/04/2017   Arrhythmia Estimate 1980    Family History   Bladder  stone     Congenital cataract, hypertrophic cardiomyopathy, and mitochondrial myopathy syndrome (CMS/HHS-HCC)     Encounter for monitoring flecainide  therapy 09/04/2017   Hyperlipidemia Estimate 20 years    Taking Atorvastatin    Hypertension Diastolic High    Readings 85-95   PAF (paroxysmal atrial fibrillation) (CMS/HHS-HCC) 07/04/2017   Sleep apnea 05/04/17    No Evidence   Stroke (CMS/HHS-HCC) 2001    Torn Carotid Artery   Supraventricular tachycardia (HHS-HCC) 01/16/17    Ongoing   Syncope and collapse 06/25/17    Another on 08/15/17   Ventricular tachycardia (CMS/HHS-HCC) 8/21-9/20/18    Heart Monitor   Wrist fracture, left           Patient Active Problem List  Diagnosis   PAF (paroxysmal atrial fibrillation) (CMS/HHS-HCC)   Essential hypertension   Hyperlipidemia, mixed   History of CVA (cerebrovascular accident)   History of chest pain   Arrhythmia   Syncope   Spinal stenosis of lumbar region at multiple levels   Degenerative spondylolisthesis   SVT (supraventricular tachycardia) (HHS-HCC)   Encounter for monitoring flecainide  therapy   Anticoagulated           Past Surgical History:  Procedure Laterality Date   CARDIAC CATHETERIZATION   06/27/2017    [facility]   Cardiac Cath   12/24/2023   HERNIA REPAIR       LITHOTRIPSY  Allergies  Allergen Reactions   Shellfish Containing Products Anaphylaxis   Alfuzosin Other (See Comments)      Unknown   Simvastatin Other (See Comments)      Unknown   Tamsulosin Other (See Comments)      Unknown            Current Outpatient Medications on File Prior to Visit  Medication Sig Dispense Refill   atorvastatin  (LIPITOR) 20 MG tablet TAKE 1 TABLET BY MOUTH EVERY DAY IN THE EVENING   0   cholecalciferol (VITAMIN D3) 2,000 unit capsule Take 4,000 Units by mouth once daily       dilTIAZem  (CARDIZEM  CD) 120 MG XR capsule TAKE 1 CAPSULE BY MOUTH ONCE DAILY . APPOINTMENT REQUIRED FOR FUTURE REFILLS        magnesium citrate oral solution Take 296 mLs by mouth once       polyethylene glycol (MIRALAX) packet Take 17 g by mouth once daily Mix in 4-8ounces of fluid prior to taking.       rivaroxaban  (XARELTO ) 20 mg tablet Take 20 mg by mouth daily with breakfast         Compound Medication 2 (two) times daily Intenzyme forte 2 tabs BID (Patient not taking: Reported on 06/13/2023)       docusate sodium  (COLACE ORAL) Take by mouth       flecainide  (TAMBOCOR ) 50 MG tablet Take 50 mg by mouth 2 (two) times daily       gabapentin  (NEURONTIN ) 100 MG capsule Take 1 capsule (100 mg total) by mouth 3 (three) times daily 90 capsule 11   glucosamine sulfate (GLUCOSAMINE) 500 mg Tab Take 1,000 mg by mouth once daily          metoprolol  succinate (TOPROL -XL) 25 MG XL tablet Take 1 tablet (25 mg total) by mouth once daily for 90 days 90 tablet 2   metoprolol  tartrate (LOPRESSOR ) 25 MG tablet Take 25 mg by mouth once daily       naproxen sodium (ALEVE) 220 MG tablet Take 220 mg by mouth 3 (three) times daily as needed        No current facility-administered medications on file prior to visit.           Family History  Problem Relation Age of Onset   Breast cancer Mother     Irregular Heart Beat (Arrhythmia) Mother          Deceased   No Known Problems Father        Social History        Tobacco Use  Smoking Status Never  Smokeless Tobacco Never  Tobacco Comments    No Interest in smoking      Social History         Socioeconomic History   Marital status: Married  Tobacco Use   Smoking status: Never   Smokeless tobacco: Never   Tobacco comments:      No Interest in smoking  Vaping Use   Vaping status: Never Used  Substance and Sexual Activity   Alcohol use: Not Currently      Comment: No alcohol since 06/25/17. Previous 2 drinks per day( 5 days)   Drug use: Never   Sexual activity: Yes      Partners: Female    Social Drivers of Acupuncturist Strain: Low Risk   (06/08/2023)    Overall Financial Resource Strain (CARDIA)     Difficulty of Paying  Living Expenses: Not hard at all  Food Insecurity: No Food Insecurity (06/08/2023)    Hunger Vital Sign     Worried About Running Out of Food in the Last Year: Never true     Ran Out of Food in the Last Year: Never true  Transportation Needs: No Transportation Needs (06/08/2023)    PRAPARE - Therapist, Art (Medical): No     Lack of Transportation (Non-Medical): No  Housing Stability: Low Risk  (06/12/2023)    Housing Stability Vital Sign     Unable to Pay for Housing in the Last Year: No     Number of Times Moved in the Last Year: 0     Homeless in the Last Year: No      Objective:   BP (!) 140/94   Pulse 70   Temp 97.6 F (36.4 C) (Oral)   Resp 18   Ht 5' 7.5 (1.715 m)   Wt 65.6 kg   SpO2 93%   BMI 22.31 kg/m     There is no height or weight on file to calculate BMI. Physical Exam Constitutional:      Appearance: Normal appearance.  HENT:     Head: Normocephalic and atraumatic.     Nose: Nose normal. No congestion.     Mouth/Throat:     Mouth: Mucous membranes are moist.     Pharynx: Oropharynx is clear.  Eyes:     Pupils: Pupils are equal, round, and reactive to light.  Cardiovascular:     Rate and Rhythm: Normal rate and regular rhythm.     Pulses: Normal pulses.     Heart sounds: Normal heart sounds. No murmur heard.    No friction rub. No gallop.  Pulmonary:     Effort: Pulmonary effort is normal. No respiratory distress.     Breath sounds: Normal breath sounds. No stridor. No wheezing, rhonchi or rales.  Abdominal:     General: Abdomen is flat.     Hernia: A hernia is present. Hernia is present in the right inguinal area. There is no hernia in the left inguinal area.  Musculoskeletal:        General: Normal range of motion.     Cervical back: Normal range of motion.  Skin:    General: Skin is warm and dry.  Neurological:     General: No focal  deficit present.     Mental Status: He is alert and oriented to person, place, and time.  Psychiatric:        Mood and Affect: Mood normal.        Thought Content: Thought content normal.         Assessment and Plan:  Diagnoses and all orders for this visit:   Unilateral inguinal hernia without obstruction or gangrene, recurrence not specified     Billy Carpenter is a 78 y.o. male     We will proceed to the OR for a LAP RIGHT inguinal hernia repair with mesh. All risks and benefits were discussed with the patient, to generally include infection, bleeding, damage to surrounding structures, acute and chronic nerve pain, and recurrence. Alternatives were offered and described.  All questions were answered and the patient voiced understanding of the procedure and wishes to proceed at this point.             No follow-ups on file.   Lynda Leos, MD, [facility], [place] [facility] Minimally Invasive Surgery

## 2024-05-05 NOTE — Op Note (Signed)
 05/05/2024  2:15 PM  PATIENT:  Billy Carpenter  78 y.o. male  PRE-OPERATIVE DIAGNOSIS:  RIGHT INGUINAL HERNIA  POST-OPERATIVE DIAGNOSIS:  RIGHT DIRECT INGUINAL HERNIA  PROCEDURE:  Procedure(s) with comments: REPAIR, HERNIA, INGUINAL, LAPAROSCOPIC (Right) - LAPAROSCOPIC, RIGHT INGUINAL HERNIA, MESH  SURGEON:  Surgeons and Role:    * Rubin Calamity, MD - Primary  ASSISTANTS: Lannette Rue, MD PGY- 4  ANESTHESIA:   local and general  EBL:  minimal   BLOOD ADMINISTERED:none  DRAINS: none   LOCAL MEDICATIONS USED:  BUPIVICAINE   SPECIMEN:  No Specimen  DISPOSITION OF SPECIMEN:  N/A  COUNTS:  YES  TOURNIQUET:  * No tourniquets in log *  DICTATION: .Dragon Dictation  Counts: reported as correct x 2  Findings:  The patient had a small right direct hernia  Indications for procedure:  The patient is a 78 year old male with a right hernia for several months. Patient complained of symptomatology to his right inguinal area. The patient was taken back for elective inguinal hernia repair.  Details of the procedure: The patient was taken back to the operating room. The patient was placed in supine position with bilateral SCDs in place.  The patient was prepped and draped in the usual sterile fashion.  After appropriate anitbiotics were confirmed, a time-out was confirmed and all facts were verified.  0.25% Marcaine  was used to infiltrate the umbilical area. A 11-blade was used to cut down the skin and blunt dissection was used to get the anterior fashion.  The anterior fascia was incised approximately 1 cm and the muscles were retracted laterally. Blunt dissection was then used to create a space in the preperitoneal area. At this time a 10 mm camera was then introduced into the space and advanced the pubic tubercle and a 12 mm trocar was placed over this and insufflation was started.  At this time and space was created from medial to laterally the preperitoneal space.  Cooper's  ligament was initially cleaned off.  The hernia was identified in the direct space. Dissection of the hernia sac was undertaken and it was fully reduced.  The transversalis fascia retracted spontaneously.    The spermatic cord and the vas deferens was identified and protected in all parts of the case. Cremasterics were dissected off from the cord.    The peritoneum was taken down to approximately the umbilicus a Bard 3D Max mesh, size: Large, was  introduced into the preperitoneal space.  The mesh was brought over to cover the direct and indirect hernia spaces.  This was anchored into place and secured to Cooper's ligament with 4.61mm staples from a [place] hernia stapler. It was anchored to the anterior abdominal wall with 4.8 mm staples. The peritoneum was seen lying posterior to the mesh. There was no staples placed laterally. The insufflation was evacuated and the peritoneum was seen posterior to the mesh. The trochars were removed. The anterior fascia was reapproximated using #1 Vicryl on a UR- 6.  Intra-abdominal air was evacuated and the Veress needle removed. The skin was reapproximated using 4-0 Monocryl subcuticular fashion.  The skin was dressed with Dermabond.  The patient was awakened from general anesthesia and taken to recovery in stable condition.    PLAN OF CARE: Discharge to home after PACU  PATIENT DISPOSITION:  PACU - hemodynamically stable.   Delay start of Pharmacological VTE agent (>24hrs) due to surgical blood loss or risk of bleeding: not applicable

## 2024-05-05 NOTE — Anesthesia Postprocedure Evaluation (Signed)
 Anesthesia Post Note  Patient: Billy Carpenter  Procedure(s) Performed: REPAIR, HERNIA, INGUINAL, LAPAROSCOPIC (Right)     Patient location during evaluation: PACU Anesthesia Type: General Level of consciousness: awake and alert Pain management: pain level controlled Vital Signs Assessment: post-procedure vital signs reviewed and stable Respiratory status: spontaneous breathing, nonlabored ventilation, respiratory function stable and patient connected to nasal cannula oxygen Cardiovascular status: blood pressure returned to baseline and stable Postop Assessment: no apparent nausea or vomiting Anesthetic complications: no   No notable events documented.  Last Vitals:  Vitals:   05/05/24 1445 05/05/24 1500  BP: 121/68 127/74  Pulse: 94 87  Resp: 13 18  Temp:    SpO2: 99% 96%    Last Pain:  Vitals:   05/05/24 1500  TempSrc:   PainSc: 0-No pain                 Billy Carpenter

## 2024-05-05 NOTE — Anesthesia Procedure Notes (Signed)
 Procedure Name: Intubation Date/Time: 05/05/2024 1:49 PM  Performed by: Evette Ade, CRNAPre-anesthesia Checklist: Patient identified, Emergency Drugs available, Suction available and Patient being monitored Patient Re-evaluated:Patient Re-evaluated prior to induction Oxygen Delivery Method: Circle system utilized Preoxygenation: Pre-oxygenation with 100% oxygen Induction Type: IV induction Ventilation: Mask ventilation without difficulty and Oral airway inserted - appropriate to patient size Laryngoscope Size: Mac and 4 Grade View: Grade II Tube type: Oral Tube size: 7.5 mm Number of attempts: 1 Airway Equipment and Method: Stylet Placement Confirmation: ETT inserted through vocal cords under direct vision, positive ETCO2 and breath sounds checked- equal and bilateral Secured at: 22 cm Tube secured with: Tape Dental Injury: Teeth and Oropharynx as per pre-operative assessment

## 2024-05-06 ENCOUNTER — Encounter (HOSPITAL_COMMUNITY): Payer: Self-pay | Admitting: General Surgery

## 2024-05-12 ENCOUNTER — Ambulatory Visit

## 2024-05-12 DIAGNOSIS — I2510 Atherosclerotic heart disease of native coronary artery without angina pectoris: Secondary | ICD-10-CM

## 2024-05-12 NOTE — Discharge Instructions (Signed)

## 2024-05-13 ENCOUNTER — Ambulatory Visit
Admission: RE | Admit: 2024-05-13 | Discharge: 2024-05-13 | Disposition: A | Source: Ambulatory Visit | Attending: Neurosurgery | Admitting: Neurosurgery

## 2024-05-13 ENCOUNTER — Inpatient Hospital Stay: Admission: RE | Admit: 2024-05-13 | Discharge: 2024-05-13 | Attending: Neurosurgery | Admitting: Neurosurgery

## 2024-05-13 DIAGNOSIS — M4316 Spondylolisthesis, lumbar region: Secondary | ICD-10-CM

## 2024-05-13 LAB — CUP PACEART REMOTE DEVICE CHECK
Date Time Interrogation Session: 20251214231038
Implantable Pulse Generator Implant Date: 20201211

## 2024-05-13 MED ORDER — IOPAMIDOL (ISOVUE-M 200) INJECTION 41%
18.0000 mL | Freq: Once | INTRAMUSCULAR | Status: AC
  Administered 2024-05-13: 10:00:00 18 mL via INTRATHECAL

## 2024-05-16 NOTE — Progress Notes (Signed)
 Remote Loop Recorder Transmission

## 2024-05-17 ENCOUNTER — Encounter: Payer: Self-pay | Admitting: Cardiovascular Disease

## 2024-05-28 ENCOUNTER — Ambulatory Visit: Payer: Self-pay | Admitting: Cardiovascular Disease

## 2024-06-03 ENCOUNTER — Ambulatory Visit: Payer: Self-pay | Admitting: Cardiovascular Disease

## 2024-06-03 LAB — BASIC METABOLIC PANEL WITH GFR
BUN/Creatinine Ratio: 13 (ref 10–24)
BUN: 15 mg/dL (ref 8–27)
CO2: 25 mmol/L (ref 20–29)
Calcium: 9.9 mg/dL (ref 8.6–10.2)
Chloride: 101 mmol/L (ref 96–106)
Creatinine, Ser: 1.15 mg/dL (ref 0.76–1.27)
Glucose: 113 mg/dL — ABNORMAL HIGH (ref 70–99)
Potassium: 3.9 mmol/L (ref 3.5–5.2)
Sodium: 142 mmol/L (ref 134–144)
eGFR: 64 mL/min/1.73

## 2024-06-04 ENCOUNTER — Other Ambulatory Visit: Payer: Self-pay | Admitting: Cardiovascular Disease

## 2024-06-05 ENCOUNTER — Encounter: Payer: Self-pay | Admitting: Cardiovascular Disease

## 2024-06-05 ENCOUNTER — Ambulatory Visit: Attending: Cardiovascular Disease | Admitting: Cardiovascular Disease

## 2024-06-05 VITALS — BP 140/100 | HR 73 | Ht 67.0 in | Wt 154.7 lb

## 2024-06-05 DIAGNOSIS — I10 Essential (primary) hypertension: Secondary | ICD-10-CM

## 2024-06-05 DIAGNOSIS — I471 Supraventricular tachycardia, unspecified: Secondary | ICD-10-CM

## 2024-06-05 MED ORDER — FLECAINIDE ACETATE 50 MG PO TABS: 50.0000 mg | ORAL_TABLET | Freq: Two times a day (BID) | ORAL | 3 refills | Status: DC

## 2024-06-05 MED ORDER — FLECAINIDE ACETATE 50 MG PO TABS: 50.0000 mg | ORAL_TABLET | Freq: Two times a day (BID) | ORAL | 1 refills | Status: AC

## 2024-06-05 MED ORDER — DILTIAZEM HCL ER COATED BEADS 120 MG PO CP24: 120.0000 mg | ORAL_CAPSULE | Freq: Every day | ORAL | 3 refills | Status: AC

## 2024-06-05 MED ORDER — FLECAINIDE ACETATE 50 MG PO TABS: 50.0000 mg | ORAL_TABLET | Freq: Two times a day (BID) | ORAL | 1 refills | Status: DC

## 2024-06-05 MED ORDER — DILTIAZEM HCL ER COATED BEADS 120 MG PO CP24: 120.0000 mg | ORAL_CAPSULE | Freq: Every day | ORAL | 3 refills | Status: DC

## 2024-06-05 NOTE — Progress Notes (Signed)
 " Electrophysiology Office Note:    Date:  06/05/2024   ID:  Billy Carpenter, DOB [birth year], MRN [id number]  PCP:  Burney Darice CROME, MD   [facility] Providers Cardiologist:  Lonni Cash, MD Electrophysiologist:  Eulas FORBES Furbish, MD     Referring MD: Burney Darice CROME, MD   History of Present Illness:    Billy Carpenter is a 79 y.o. male with a medical history significant for atrial fibrillation, recurrent syncope, orthostatic hypotension, nonsustained VT, stroke, and carotid artery dissection. a former patient of Dr. Fernande, he presents today for follow-up.      Discussed the use of AI scribe software for clinical note transcription with the patient, who gave verbal consent to proceed.  History of Present Illness Billy Carpenter is a 79 year old male with recurrent syncope and atrial fibrillation who presents for follow-up and consideration of ablation. Referred by Dr. Fernande for management of atrial fibrillation and consideration of ablation.  He has a history of recurrent syncope and orthostatic hypotension, with two significant episodes of passing out. One episode occurred while driving, which he attributes to metoprolol -induced hypotension. Metoprolol  has been discontinued due to these side effects.  He has a history of atrial fibrillation, initially diagnosed in 2018 following an episode of vertigo. During episodes, he experiences palpitations and rapid heart rates, with average rates of 158-164 bpm and maximum rates reaching up to 188 bpm. He is currently on flecainide  for rhythm control.  A loop recorder was installed in 2021 to monitor his heart rhythm, revealing multiple episodes of narrow complex tachycardia. He finds the monthly reports from the device helpful in tracking his condition.  He has a history of stroke and carotid artery dissection, with a secondary hypercoagulable state. His CHADS2VASc score is 7, indicating a high risk of stroke.  He underwent  back surgery in February 2025 for L4 and L5 and is currently recovering from pinched nerve pain, particularly on one side. He reports no pain while lying flat but experiences discomfort when standing.  He underwent atrial fibrillation ablation in July 2025 with pulsed energy to the pulmonary veins and posterior wall.  After, he continued to have episodes of atrial fibrillation, but his burden has been fairly low.  Last month, he was in atrial fibrillation about 0.6 percent of the time.  Rates are not well-controlled.  Diltiazem  was increased to 180 mg, but this resulted in increased constipation and vivid dreams, which he is not tolerating well.         Today, He reports he is at baseline and has no new complaints.  EKGs/Labs/Other Studies Reviewed Today:     Echocardiogram:  TTE June 26, 2017 LVEF 50%.  Mild LVH.  Grade 1 diastolic dysfunction.  Severe left atrial dilation.  TTE December 2025 LVEF 55 to 60%.  Cardiac catherization  January 2019 Results reviewed in epic  EKG:   EKG Interpretation Date/Time:  Thursday June 05 2024 09:35:32 EST Ventricular Rate:  73 PR Interval:  240 QRS Duration:  88 QT Interval:  386 QTC Calculation: 425 R Axis:   -37  Text Interpretation: Sinus rhythm with marked sinus arrhythmia with 1st degree A-V block Left axis deviation When compared with ECG of 01-May-2024 09:34, Premature ventricular complexes are no longer Present Confirmed by Furbish Eulas [phone]) on 06/05/2024 9:40:10 AM     Physical Exam:    VS:  BP (!) 140/100 (BP Location: Left Arm, Patient Position: Sitting, Cuff Size: Normal)  Pulse 73   Ht 5' 7 (1.702 m)   Wt 154 lb 11.2 oz (70.2 kg)   SpO2 97%   BMI 24.23 kg/m     Wt Readings from Last 3 Encounters:  06/05/24 154 lb 11.2 oz (70.2 kg)  05/05/24 144 lb 9.6 oz (65.6 kg)  05/01/24 152 lb 3.2 oz (69 kg)     GEN: Well nourished, well developed in no acute distress CARDIAC: RRR, no murmurs, rubs,  gallops RESPIRATORY:  Normal work of breathing MUSCULOSKELETAL: no edema    ASSESSMENT & PLAN:     Paroxysmal atrial fibrillation He is symptomatic with palpitations and fatigue Rates are not at all well-controlled, up to 160 bpm median rate Status post atrial fibrillation ablation July 2025 He continued to have atrial fibrillation episodes after the ablation On flecainide  50 mg p.o. twice daily, diltiazem  180 mg Will decrease diltiazem  to 120 mg Schedule exercise treadmill test to assess for ischemia, flecainide  safety    Recurrent syncope No definitive cause identified on loop recorder  Secondary hypercoagulable state CHA2DS2-VASc score 7      Signed, Eulas FORBES Furbish, MD  06/05/2024 9:40 AM    [facility] "

## 2024-06-05 NOTE — Patient Instructions (Signed)
 Medication Instructions:  Your physician has recommended you make the following change in your medication:   **  Stop Diltiazem  180mg   ** Begin Diltiazem  120mg  - 1 tablet by mouth daily.  *If you need a refill on your cardiac medications before your next appointment, please call your pharmacy*  Lab Work: None ordered.  If you have labs (blood work) drawn today and your tests are completely normal, you will receive your results only by: MyChart Message (if you have MyChart) OR A paper copy in the mail If you have any lab test that is abnormal or we need to change your treatment, we will call you to review the results.  Testing/Procedures: Your physician has requested that you have an exercise tolerance test. For further information please visit [email or web address] Please also follow instruction sheet, as given.   Follow-Up: At [facility], you and your health needs are our priority.  As part of our continuing mission to provide you with exceptional heart care, our providers are all part of one team.  This team includes your primary Cardiologist (physician) and Advanced Practice Providers or APPs (Physician Assistants and Nurse Practitioners) who all work together to provide you with the care you need, when you need it.  Your next appointment:   [facility] 6 months  We recommend signing up for the patient portal called MyChart.  Sign up information is provided on this After Visit Summary.  MyChart is used to connect with patients for Virtual Visits (Telemedicine).  Patients are able to view lab/test results, encounter notes, upcoming appointments, etc.  Non-urgent messages can be sent to your provider as well.   To learn more about what you can do with MyChart, go to [email or web address].   Other Instructions Do not hold Flecainide  or Diltiazem  for stress test

## 2024-06-09 ENCOUNTER — Ambulatory Visit (HOSPITAL_COMMUNITY)
Admission: RE | Admit: 2024-06-09 | Discharge: 2024-06-09 | Disposition: A | Discharge status: Home / Self Care | Source: Ambulatory Visit | Attending: Cardiovascular Disease | Admitting: Cardiovascular Disease

## 2024-06-09 DIAGNOSIS — I7121 Aneurysm of the ascending aorta, without rupture: Secondary | ICD-10-CM | POA: Insufficient documentation

## 2024-06-09 MED ORDER — IOHEXOL 350 MG/ML SOLN
75.0000 mL | Freq: Once | INTRAVENOUS | Status: AC | PRN
  Administered 2024-06-09: 75 mL via INTRAVENOUS

## 2024-06-12 ENCOUNTER — Ambulatory Visit

## 2024-06-12 DIAGNOSIS — I471 Supraventricular tachycardia, unspecified: Secondary | ICD-10-CM | POA: Diagnosis not present

## 2024-06-12 LAB — CUP PACEART REMOTE DEVICE CHECK
Date Time Interrogation Session: 20260114230638
Implantable Pulse Generator Implant Date: 20201211

## 2024-06-13 ENCOUNTER — Ambulatory Visit: Payer: Self-pay | Admitting: Cardiovascular Disease

## 2024-06-20 ENCOUNTER — Telehealth (HOSPITAL_COMMUNITY): Payer: Self-pay | Admitting: *Deleted

## 2024-06-20 NOTE — Telephone Encounter (Signed)
 Reminder call with instructions given for upcoming GXT on 06/27/24 at 10:30

## 2024-06-20 NOTE — Progress Notes (Signed)
 Remote Loop Recorder Transmission

## 2024-06-27 ENCOUNTER — Ambulatory Visit (HOSPITAL_COMMUNITY)
Admission: RE | Admit: 2024-06-27 | Discharge: 2024-06-27 | Disposition: A | Source: Ambulatory Visit | Attending: Cardiovascular Disease

## 2024-06-27 DIAGNOSIS — I471 Supraventricular tachycardia, unspecified: Secondary | ICD-10-CM | POA: Diagnosis present

## 2024-06-27 LAB — EXERCISE TOLERANCE TEST
Angina Index: 0
Base ST Depression (mm): 0 mm
Duke Treadmill Score: 6
Estimated workload: 7
Exercise duration (min): 6 min
Exercise duration (sec): 0 s
MPHR: 140 {beats}/min
Peak HR: 127 {beats}/min
Percent HR: 90 %
RPE: 19
Rest HR: 75 {beats}/min
ST Depression (mm): 0 mm

## 2024-07-04 ENCOUNTER — Ambulatory Visit: Payer: Self-pay | Admitting: Cardiovascular Disease

## 2024-07-13 ENCOUNTER — Encounter

## 2024-08-13 ENCOUNTER — Encounter

## 2024-09-13 ENCOUNTER — Encounter

## 2024-12-03 ENCOUNTER — Ambulatory Visit (HOSPITAL_COMMUNITY): Admitting: Internal Medicine
